# Patient Record
Sex: Female | Born: 1935
Health system: Southern US, Community
[De-identification: ages and names within clinical notes are randomized; demographics above are authoritative.]

## PROBLEM LIST (undated history)

## (undated) DIAGNOSIS — K759 Inflammatory liver disease, unspecified: Secondary | ICD-10-CM

## (undated) DIAGNOSIS — I4891 Unspecified atrial fibrillation: Secondary | ICD-10-CM

## (undated) DIAGNOSIS — I6529 Occlusion and stenosis of unspecified carotid artery: Secondary | ICD-10-CM

## (undated) DIAGNOSIS — I519 Heart disease, unspecified: Secondary | ICD-10-CM

## (undated) DIAGNOSIS — E119 Type 2 diabetes mellitus without complications: Secondary | ICD-10-CM

## (undated) DIAGNOSIS — I00 Rheumatic fever without heart involvement: Secondary | ICD-10-CM

## (undated) DIAGNOSIS — E039 Hypothyroidism, unspecified: Secondary | ICD-10-CM

## (undated) DIAGNOSIS — E785 Hyperlipidemia, unspecified: Secondary | ICD-10-CM

## (undated) DIAGNOSIS — T7840XA Allergy, unspecified, initial encounter: Secondary | ICD-10-CM

## (undated) DIAGNOSIS — I639 Cerebral infarction, unspecified: Secondary | ICD-10-CM

## (undated) DIAGNOSIS — I10 Essential (primary) hypertension: Secondary | ICD-10-CM

## (undated) DIAGNOSIS — Z972 Presence of dental prosthetic device (complete) (partial): Secondary | ICD-10-CM

## (undated) DIAGNOSIS — M170 Bilateral primary osteoarthritis of knee: Secondary | ICD-10-CM

## (undated) HISTORY — DX: Allergy, unspecified, initial encounter: T78.40XA

## (undated) HISTORY — DX: Hypothyroidism, unspecified: E03.9

## (undated) HISTORY — DX: Occlusion and stenosis of unspecified carotid artery: I65.29

## (undated) HISTORY — DX: Unspecified atrial fibrillation: I48.91

## (undated) HISTORY — DX: Type 2 diabetes mellitus without complications: E11.9

## (undated) HISTORY — DX: Heart disease, unspecified: I51.9

## (undated) HISTORY — DX: Hyperlipidemia, unspecified: E78.5

## (undated) HISTORY — DX: Bilateral primary osteoarthritis of knee: M17.0

## (undated) HISTORY — DX: Rheumatic fever without heart involvement: I00

## (undated) HISTORY — DX: Cerebral infarction, unspecified: I63.9

---

## 1970-03-03 HISTORY — PX: TOTAL ABDOMINAL HYSTERECTOMY W/ BILATERAL SALPINGOOPHORECTOMY: SHX83

## 1988-03-03 DIAGNOSIS — E119 Type 2 diabetes mellitus without complications: Secondary | ICD-10-CM

## 1988-03-03 HISTORY — DX: Type 2 diabetes mellitus without complications: E11.9

## 2011-03-04 HISTORY — PX: COLONOSCOPY: SHX174

## 2012-03-03 DIAGNOSIS — I639 Cerebral infarction, unspecified: Secondary | ICD-10-CM

## 2012-03-03 DIAGNOSIS — Z8673 Personal history of transient ischemic attack (TIA), and cerebral infarction without residual deficits: Secondary | ICD-10-CM | POA: Insufficient documentation

## 2012-03-03 DIAGNOSIS — I4891 Unspecified atrial fibrillation: Secondary | ICD-10-CM

## 2012-03-03 HISTORY — DX: Unspecified atrial fibrillation: I48.91

## 2012-03-03 HISTORY — DX: Cerebral infarction, unspecified: I63.9

## 2013-08-18 LAB — HEMOGLOBIN A1C: A1C: 10.3

## 2013-08-18 LAB — LIPID PANEL
CHOLESTEROL: 160
HDL: 35 mg/dL (ref 35–70)
LDL (calc): 116
TRIGLYCERIDES: 118

## 2013-11-04 LAB — HEMOGLOBIN A1C
A1C: 7.6
A1c: 7.6

## 2014-02-13 LAB — COMPLETE METABOLIC PANEL WITH GFR
ALT: 10
AST: 16 U/L
Alkaline Phosphatase: 64 U/L
BILIRUBIN, TOTAL: 0.4
Creat: 0.8

## 2014-02-13 LAB — TSH: TSH: 5.95

## 2014-02-13 LAB — CBC
HEMOGLOBIN: 13.3 g/dL
WBC: 8.8
platelet count: 238

## 2015-10-24 ENCOUNTER — Encounter: Payer: Self-pay | Admitting: Family Medicine

## 2015-10-24 ENCOUNTER — Ambulatory Visit (INDEPENDENT_AMBULATORY_CARE_PROVIDER_SITE_OTHER)
Admission: RE | Admit: 2015-10-24 | Discharge: 2015-10-24 | Disposition: A | Discharge status: Home / Self Care | Payer: BLUE CROSS/BLUE SHIELD | Source: Ambulatory Visit | Attending: Family Medicine | Admitting: Family Medicine

## 2015-10-24 ENCOUNTER — Ambulatory Visit (HOSPITAL_COMMUNITY): Payer: BLUE CROSS/BLUE SHIELD

## 2015-10-24 ENCOUNTER — Ambulatory Visit
Admission: RE | Admit: 2015-10-24 | Discharge: 2015-10-24 | Disposition: A | Discharge status: Home / Self Care | Payer: BLUE CROSS/BLUE SHIELD | Source: Ambulatory Visit | Attending: Family Medicine | Admitting: Family Medicine

## 2015-10-24 ENCOUNTER — Ambulatory Visit (INDEPENDENT_AMBULATORY_CARE_PROVIDER_SITE_OTHER): Payer: BLUE CROSS/BLUE SHIELD | Admitting: Family Medicine

## 2015-10-24 ENCOUNTER — Other Ambulatory Visit: Payer: Self-pay | Admitting: Family Medicine

## 2015-10-24 VITALS — BP 144/68 | HR 64 | Temp 98.0°F | Ht 63.0 in | Wt 158.2 lb

## 2015-10-24 DIAGNOSIS — M170 Bilateral primary osteoarthritis of knee: Secondary | ICD-10-CM | POA: Diagnosis not present

## 2015-10-24 DIAGNOSIS — L84 Corns and callosities: Secondary | ICD-10-CM | POA: Insufficient documentation

## 2015-10-24 DIAGNOSIS — E785 Hyperlipidemia, unspecified: Secondary | ICD-10-CM

## 2015-10-24 DIAGNOSIS — I639 Cerebral infarction, unspecified: Secondary | ICD-10-CM | POA: Diagnosis not present

## 2015-10-24 DIAGNOSIS — E118 Type 2 diabetes mellitus with unspecified complications: Secondary | ICD-10-CM | POA: Insufficient documentation

## 2015-10-24 DIAGNOSIS — Z794 Long term (current) use of insulin: Secondary | ICD-10-CM

## 2015-10-24 DIAGNOSIS — E039 Hypothyroidism, unspecified: Secondary | ICD-10-CM | POA: Insufficient documentation

## 2015-10-24 MED ORDER — METFORMIN HCL 500 MG PO TABS: 500.0000 mg | ORAL_TABLET | Freq: Every day | ORAL | 3 refills | Status: DC

## 2015-10-24 MED ORDER — LEVOTHYROXINE SODIUM 75 MCG PO TABS: 75.0000 ug | ORAL_TABLET | Freq: Every day | ORAL | 3 refills | Status: DC

## 2015-10-24 MED ORDER — RIVAROXABAN 20 MG PO TABS: 20.0000 mg | ORAL_TABLET | Freq: Every day | ORAL | 3 refills | Status: DC

## 2015-10-24 MED ORDER — ATENOLOL 50 MG PO TABS: 50.0000 mg | ORAL_TABLET | Freq: Every day | ORAL | 3 refills | Status: DC

## 2015-10-24 NOTE — Progress Notes (Signed)
Pre visit review using our clinic review tool, if applicable. No additional management support is needed unless otherwise documented below in the visit note. 

## 2015-10-24 NOTE — Progress Notes (Signed)
BP (!) 144/68   Pulse 64   Temp 98 F (36.7 C) (Oral)   Ht 5\' 3"  (1.6 m)   Wt 158 lb 4 oz (71.8 kg)   BMI 28.03 kg/m    CC: new pt to establish care Subjective:    Patient ID: [name], female    DOB: 12/07/1934, 81 y.o.   MRN: [id number]  HPI: Deanna PulseLucila Fortin is a 81 y.o. female presenting on 10/24/2015 for Establish Care   Actually born [id number] but only birth certificate found had 1936 birth year.   Here from [place]. Widow. [facility] [place]   Longstanding R>L knee pain (1969). Endorses h/o metatarsal collapse during her last pregnancy. Evaluated for flexogenics - s/p xray, US - told severe degenerative osteoarthritis. Recommended hyaluronic acid. They wanted eval by PCP prior to undergoing treatment. Wants to avoid surgery. Has been using neoprene knee brace without significant improvement. Interested in referral to discuss options. Does not want steroid injections. She does cycle regularly on stationary bike (40min 2x/wk). Using amazon cream (sombra menthol cream).   Subacute CVA by CT 2014 with residual L hemiparesis. Unsure if regularly taking aspirin when she had stroke. On xarelto since 2013.  HTN - on atenolol 50mg  daily.   Hypothyroidism - off levothyroxine 75mcg for last 4 months.    DM - dx 20+ yrs ago. Regularly does check sugars once daily fa ting - today 88. Averaging Compliant with antihyperglycemic regimen which includes: lantus 20u.  Denies low sugars or hypoglycemic symptoms.  Denies paresthesias. Last diabetic eye exam DUE.  Pneumovax: DUE.  Prevnar: DUE. [facility]. Will bring me copy of immunization records. No results found for: HGBA1C Diabetic Foot Exam - Simple   No data filed       H/o bad reaction to flu shot.   Relevant past medical, surgical, family and social history reviewed and updated as indicated. Interim medical history since our last visit  reviewed. Allergies and medications reviewed and updated. No current outpatient prescriptions on file prior to visit.   No current facility-administered medications on file prior to visit.     Review of Systems Per HPI unless specifically indicated in ROS section     Objective:    BP (!) 144/68   Pulse 64   Temp 98 F (36.7 C) (Oral)   Ht 5\' 3"  (1.6 m)   Wt 158 lb 4 oz (71.8 kg)   BMI 28.03 kg/m   Wt Readings from Last 3 Encounters:  10/24/15 158 lb 4 oz (71.8 kg)    Physical Exam  Constitutional: She appears well-developed and well-nourished. No distress.  HENT:  Head: Normocephalic and atraumatic.  Right Ear: External ear normal.  Left Ear: External ear normal.  Nose: Nose normal.  Mouth/Throat: Oropharynx is clear and moist. No oropharyngeal exudate.  Eyes: Conjunctivae and EOM are normal. Pupils are equal, round, and reactive to light. No scleral icterus.  Neck: Normal range of motion. Neck supple.  Cardiovascular: Normal rate, normal heart sounds and intact distal pulses.  An irregular rhythm present.  No murmur heard. Pulmonary/Chest: Effort normal and breath sounds normal. No respiratory distress. She has no wheezes. She has no rales.  Musculoskeletal: She exhibits no edema.  FROM bilateral knees without significant crepitus  Lymphadenopathy:    She has no cervical adenopathy.  Skin: Skin is warm and dry. No rash noted.  R fourth distal toe with tender overgrown  corn  Psychiatric: She has a normal mood and affect.  Nursing note and vitals reviewed.  No results found for this or any previous visit.    Assessment & Plan:  Over 45 minutes were spent face-to-face with the patient during this encounter and >50% of that time was spent on counseling and coordination of care  Problem List Items Addressed This Visit    Corns and callus    Refer to podiatry for diabetic foot exam and further evaluation of tender distal toe corn       Relevant Orders   Ambulatory  referral to Podiatry   HLD (hyperlipidemia)    Not on statin. Check FLP when returns for fasting labs.      Relevant Medications   atenolol (TENORMIN) 50 MG tablet   rivaroxaban (XARELTO) 20 MG TABS tablet   Other Relevant Orders   Lipid panel   Comprehensive metabolic panel   Hypothyroidism    Unclear if patient is taking levothyroxine. Have refilled regardless. Check TSH.       Relevant Medications   atenolol (TENORMIN) 50 MG tablet   levothyroxine (SYNTHROID, LEVOTHROID) 75 MCG tablet   Other Relevant Orders   TSH   Primary osteoarthritis of both knees    Pt wants to avoid surgery, also prefers to avoid corticosteroid shots due to poor effectiveness.  Will refer to sports med to discuss possible hyaluronic acid. Baseline weight bearing knee xrays today.      Relevant Orders   AMB referral to sports medicine   Stroke [facility](HCC)    H/o this, unclear story possible atrial fibrillation related. I have requested latest cardiology records [facility]. Continue meds for now (xarelto, atenolol). H/o remote L hemiparesis but pt denies significant impairment.       Relevant Medications   atenolol (TENORMIN) 50 MG tablet   rivaroxaban (XARELTO) 20 MG TABS tablet   Other Relevant Orders   CBC with Differential/Platelet   Type 2 diabetes mellitus with complication, with long-term current use of insulin (HCC) - Primary    Chronic. Unclear control given fluctuating cbg's by recall. Continue lantus and metformin. Check A1c tomorrow when returns for fasting labs. Ophtho referral today for DM eye exam.      Relevant Medications   Insulin Glargine (LANTUS SOLOSTAR) 100 UNIT/ML Solostar Pen   metFORMIN (GLUCOPHAGE) 500 MG tablet   Other Relevant Orders   Ambulatory referral to Ophthalmology   Ambulatory referral to Podiatry   Comprehensive metabolic panel   Hemoglobin A1c   Microalbumin / creatinine urine ratio    Other Visit Diagnoses   None.      Follow up  plan: Return in about 3 months (around 01/24/2016) for annual exam, prior fasting for blood work.  Eustaquio BoydenJavier Brylyn Novakovich, MD

## 2015-10-24 NOTE — Patient Instructions (Addendum)
Traigame records de imunizaciones.  Gusto conocerla hoy.  Pediremos records de [facility] en [place]. La mandaremos a Dr [employer] doctor de medicina deportiva. rayos x hoy para rodillas. Regrese maana para laboratorios.  Le he rellenado medicinas hoy.  Regresar en 3 meses para proxima visita

## 2015-10-24 NOTE — Assessment & Plan Note (Signed)
Pt wants to avoid surgery, also prefers to avoid corticosteroid shots due to poor effectiveness.  Will refer to sports med to discuss possible hyaluronic acid. Baseline weight bearing knee xrays today.

## 2015-10-24 NOTE — Assessment & Plan Note (Addendum)
H/o this, unclear story possible atrial fibrillation related. I have requested [facility]. Continue meds for now (xarelto, atenolol). H/o remote L hemiparesis but pt denies significant impairment.

## 2015-10-24 NOTE — Assessment & Plan Note (Signed)
Refer to podiatry for diabetic foot exam and further evaluation of tender distal toe corn

## 2015-10-24 NOTE — Assessment & Plan Note (Signed)
Not on statin. Check FLP when returns for fasting labs.

## 2015-10-24 NOTE — Assessment & Plan Note (Signed)
Unclear if patient is taking levothyroxine. Have refilled regardless. Check TSH.

## 2015-10-24 NOTE — Assessment & Plan Note (Deleted)
Chronic. Unclear control given fluctuating cbg's by recall. Continue lantus and metformin. Check A1c tomorrow when returns for fasting labs. Ophtho referral today for DM eye exam. 

## 2015-10-24 NOTE — Assessment & Plan Note (Signed)
Chronic. Unclear control given fluctuating cbg's by recall. Continue lantus and metformin. Check A1c tomorrow when returns for fasting labs. Ophtho referral today for DM eye exam.

## 2015-10-25 ENCOUNTER — Encounter: Payer: Self-pay | Admitting: Family Medicine

## 2015-10-25 ENCOUNTER — Other Ambulatory Visit (INDEPENDENT_AMBULATORY_CARE_PROVIDER_SITE_OTHER): Payer: BLUE CROSS/BLUE SHIELD

## 2015-10-25 DIAGNOSIS — Z794 Long term (current) use of insulin: Secondary | ICD-10-CM

## 2015-10-25 DIAGNOSIS — I639 Cerebral infarction, unspecified: Secondary | ICD-10-CM

## 2015-10-25 DIAGNOSIS — E039 Hypothyroidism, unspecified: Secondary | ICD-10-CM | POA: Diagnosis not present

## 2015-10-25 DIAGNOSIS — E785 Hyperlipidemia, unspecified: Secondary | ICD-10-CM | POA: Diagnosis not present

## 2015-10-25 DIAGNOSIS — I6529 Occlusion and stenosis of unspecified carotid artery: Secondary | ICD-10-CM | POA: Insufficient documentation

## 2015-10-25 DIAGNOSIS — E118 Type 2 diabetes mellitus with unspecified complications: Secondary | ICD-10-CM

## 2015-10-25 DIAGNOSIS — M8580 Other specified disorders of bone density and structure, unspecified site: Secondary | ICD-10-CM | POA: Insufficient documentation

## 2015-10-25 HISTORY — DX: Occlusion and stenosis of unspecified carotid artery: I65.29

## 2015-10-25 LAB — CBC WITH DIFFERENTIAL/PLATELET
BASOS PCT: 0.5 % (ref 0.0–3.0)
Basophils Absolute: 0 10*3/uL (ref 0.0–0.1)
EOS PCT: 3.5 % (ref 0.0–5.0)
Eosinophils Absolute: 0.3 10*3/uL (ref 0.0–0.7)
HCT: 34.6 % — ABNORMAL LOW (ref 36.0–46.0)
Hemoglobin: 11.3 g/dL — ABNORMAL LOW (ref 12.0–15.0)
LYMPHS ABS: 1.9 10*3/uL (ref 0.7–4.0)
Lymphocytes Relative: 24.8 % (ref 12.0–46.0)
MCHC: 32.7 g/dL (ref 30.0–36.0)
MCV: 81.3 fl (ref 78.0–100.0)
MONO ABS: 0.5 10*3/uL (ref 0.1–1.0)
MONOS PCT: 6.3 % (ref 3.0–12.0)
NEUTROS PCT: 64.9 % (ref 43.0–77.0)
Neutro Abs: 4.9 10*3/uL (ref 1.4–7.7)
Platelets: 217 10*3/uL (ref 150.0–400.0)
RBC: 4.25 Mil/uL (ref 3.87–5.11)
RDW: 15.8 % — AB (ref 11.5–15.5)
WBC: 7.6 10*3/uL (ref 4.0–10.5)

## 2015-10-25 LAB — COMPREHENSIVE METABOLIC PANEL
ALT: 9 U/L (ref 0–35)
AST: 15 U/L (ref 0–37)
Albumin: 3.8 g/dL (ref 3.5–5.2)
Alkaline Phosphatase: 47 U/L (ref 39–117)
BILIRUBIN TOTAL: 0.4 mg/dL (ref 0.2–1.2)
BUN: 17 mg/dL (ref 6–23)
CHLORIDE: 105 meq/L (ref 96–112)
CO2: 28 meq/L (ref 19–32)
CREATININE: 0.81 mg/dL (ref 0.40–1.20)
Calcium: 8.7 mg/dL (ref 8.4–10.5)
GFR: 72.5 mL/min (ref >60.00)
GLUCOSE: 80 mg/dL (ref 70–99)
Potassium: 4.1 mEq/L (ref 3.5–5.1)
SODIUM: 139 meq/L (ref 135–145)
Total Protein: 7.1 g/dL (ref 6.0–8.3)

## 2015-10-25 LAB — LIPID PANEL
CHOL/HDL RATIO: 4
Cholesterol: 122 mg/dL (ref 0–200)
HDL: 33.4 mg/dL — ABNORMAL LOW (ref >39.00)
LDL CALC: 69 mg/dL (ref 0–99)
NONHDL: 88.65
TRIGLYCERIDES: 98 mg/dL (ref 0.0–149.0)
VLDL: 19.6 mg/dL (ref 0.0–40.0)

## 2015-10-25 LAB — MICROALBUMIN / CREATININE URINE RATIO
CREATININE, U: 59.9 mg/dL
MICROALB/CREAT RATIO: 1.2 mg/g (ref 0.0–30.0)

## 2015-10-25 LAB — TSH: TSH: 3.89 u[IU]/mL (ref 0.35–4.50)

## 2015-10-25 LAB — HEMOGLOBIN A1C: HEMOGLOBIN A1C: 7.3 % — AB (ref 4.6–6.5)

## 2015-10-27 ENCOUNTER — Encounter: Payer: Self-pay | Admitting: Family Medicine

## 2015-10-29 ENCOUNTER — Encounter: Payer: Self-pay | Admitting: *Deleted

## 2015-11-01 ENCOUNTER — Ambulatory Visit (INDEPENDENT_AMBULATORY_CARE_PROVIDER_SITE_OTHER): Payer: BLUE CROSS/BLUE SHIELD | Admitting: Family Medicine

## 2015-11-01 ENCOUNTER — Encounter: Payer: Self-pay | Admitting: Family Medicine

## 2015-11-01 VITALS — BP 105/59 | HR 85 | Temp 97.9°F | Ht 63.0 in | Wt 159.5 lb

## 2015-11-01 DIAGNOSIS — M170 Bilateral primary osteoarthritis of knee: Secondary | ICD-10-CM | POA: Diagnosis not present

## 2015-11-01 MED ORDER — HYLAN G-F 20 48 MG/6ML IX SOSY
48.0000 [IU] | PREFILLED_SYRINGE | Freq: Once | INTRA_ARTICULAR | Status: AC
  Administered 2015-11-01: 48 [IU] via INTRA_ARTICULAR

## 2015-11-01 NOTE — Progress Notes (Signed)
Dr. Karleen HampshireSpencer T. Malarie Tappen, MD, CAQ Sports Medicine Primary Care and Sports Medicine [address] [place], [address] Phone: [phone] Fax: [phone]  11/01/2015  Patient: [name], MRN: [id number], DOB: 04/09/1934, 81 y.o.  Primary Physician:  Eustaquio BoydenJavier Gutierrez, MD   Chief Complaint  Patient presents with  . Knee Pain    Bilateral   Subjective:   [name] is a 81 y.o. very pleasant female patient who presents with the following:  B knee pain: Seen at the request of Dr. Reece AgarG.  Primary language interpretation is done by the patient's daughter.  The patient reports that she has been having problems with both of her knees for 50 years.  She does have notable genu varus.  She is unclear about any kind specific trauma or injury.  She is seen multiple doctors in [place] regarding her knees.  It seems as if at some 0.20 years ago she was told that she did not have arthritis, and she has reluctant to accept that she does have arthritis.  I pulled up her radiographs and reviewed him face-to-face with both the patient and her daughter.  She clearly has end-stage degenerative joint disease of both of her knees.  The actually recently went to a [facility] clinic for suggestions.  They had recommended a all fluid her brace by [place], as well as hyaluronic acid injections.  Unfortunately, in this clinic, this was going to be a cash pay service.  Past Medical History, Surgical History, Social History, Family History, Problem List, Medications, and Allergies have been reviewed and updated if relevant.  Patient Active Problem List   Diagnosis Date Noted  . Carotid stenosis 10/25/2015  . Osteopenia 10/25/2015  . Primary osteoarthritis of both knees 10/24/2015  . Type 2 diabetes mellitus with complication, with long-term current use of insulin (HCC) 10/24/2015  . Corns and callus 10/24/2015  . HLD (hyperlipidemia)   . Hypothyroidism   . Ischemic stroke (HCC) 03/03/2012  . Atrial  fibrillation (HCC) 03/03/2012    Past Medical History:  Diagnosis Date  . Allergy   . Atrial fibrillation (HCC) 2014   presumed as on xarelto after CVA 2014  . Bilateral primary osteoarthritis of knee    severe by xrays  . Carotid stenosis 10/25/2015   Mild by US 2014  . Diabetes mellitus without complication (HCC) 1994  . Heart disease   . HLD (hyperlipidemia)   . Hypothyroidism   . Ischemic stroke (HCC) 2014   R basal ganglion infarct with L hemiparesis 2014, now largely resolved, uses cane  . Rheumatic fever     Past Surgical History:  Procedure Laterality Date  . TOTAL ABDOMINAL HYSTERECTOMY W/ BILATERAL SALPINGOOPHORECTOMY  1972    Social History   Social History  . Marital status: Widowed    Spouse name: N/A  . Number of children: N/A  . Years of education: N/A   Occupational History  . Not on file.   Social History Main Topics  . Smoking status: Never Smoker  . Smokeless tobacco: Never Used  . Alcohol use No  . Drug use: No  . Sexual activity: Not on file   Other Topics Concern  . Not on file   Social History Narrative   Lives with daughter   Len BlalockWidow    Originally from [place], moved 2013   G5P5    No family history on file.  Allergies  Allergen Reactions  . Chloramphenicols Swelling    Moderate facial swelling  Medication list reviewed and updated in full in [facility].  GEN: No fevers, chills. Nontoxic. Primarily MSK c/o today. MSK: Detailed in the HPI GI: tolerating PO intake without difficulty Neuro: No numbness, parasthesias, or tingling associated. Otherwise the pertinent positives of the ROS are noted above.   Objective:   BP (!) 105/59   [name] 85   Temp 97.9 F (36.6 C) (Oral)   Ht 5\' 3"  (1.6 m)   Wt 159 lb 8 oz (72.3 kg)   BMI 28.25 kg/m    GEN: WDWN, NAD, Non-toxic, Alert & Oriented x 3 HEENT: Atraumatic, Normocephalic.  Ears and Nose: No external deformity. EXTR: No clubbing/cyanosis/edema NEURO:  Normal gait.  PSYCH: Normally interactive. Conversant. Not depressed or anxious appearing.  Calm demeanor.   Knee:  B Gait: Normal heel toe pattern, antalgic ROM: 0-100 Effusion: mild Echymosis or edema: none Patellar tendon NT Painful PLICA: neg Patellar grind: negative Medial and lateral patellar facet loading: mild TTP medial and lateral joint lines: medial tenderness Mcmurray's neg Flexion-pinch pos Varus and valgus stress: stable Lachman: neg Ant and Post drawer: neg Hip abduction, IR, ER: WNL Hip flexion str: 5/5 Hip abd: 5/5 Quad: 5/5 VMO atrophy: mild - mod Hamstring concentric and eccentric: 5/5   Radiology: Dg Knee Ap/lat W/sunrise Left  Result Date: 10/24/2015 CLINICAL DATA:  Knee pain, initial encounter EXAM: LEFT KNEE 3 VIEWS COMPARISON:  None. FINDINGS: Severe degenerative changes are noted most marked in the medial joint space. A small joint effusion is seen. No acute fracture or dislocation is noted. IMPRESSION: Degenerative changes with small joint effusion. Electronically Signed   By: Alcide Clever M.D.   On: 10/24/2015 13:24   Dg Knee Ap/lat W/sunrise Right  Result Date: 10/24/2015 CLINICAL DATA:  Right knee pain, initial encounter EXAM: RIGHT KNEE 3 VIEWS COMPARISON:  None. FINDINGS: Severe degenerative changes are noted most marked in the medial joint space. No joint effusion is seen. Vascular calcifications are noted. IMPRESSION: Degenerative changes most marked in the medial joint space. Electronically Signed   By: Alcide Clever M.D.   On: 10/24/2015 13:23    Assessment and Plan:   Primary osteoarthritis of both knees - Plan: Hylan SOSY 48 Units, Hylan SOSY 48 Units  >25 minutes spent in face to face time with patient, >50% spent in counselling or coordination of care: additional time needed given language barriers and interpretation.  There is also some difference and I think basic health belief systems, and the patient doesn't seem to fully understand  that she does have advanced osteoarthritis of both of her knees.  I did my best to explain the state of her knees and why she is having some knee pain.  Radiographically, doubtful anything short of a total knee arthroplasty would be a definitive relief.  She had hyaluronic acid injections for 5 years ago, and these helped quite a bit.  I think that this is very reasonable to try again.  My suggestion was to do one now and then repeat the other knee in a couple of weeks.  At this point the daughter would prefer to do both of them now.  She is willing even if one of her injections is not covered by medical insurance to pay for these on an out-of-pocket standpoint.  I also gave her a off loader brace, left-sided.  We'll see how she does with this.  If he finds it of significant benefit, then it would be very easy to give her right  one also.  Knee Injection: Synvisc-One, RIGHT Patient verbally consented to procedure. Risks (including infection), benefits, and alternatives explained. Sterilely prepped with Chloraprep. Ethyl cholride used for anesthesia, then 7 cc of Lidocaine 1% used for anesthesia in the anterolateral position. Reprepped with Chloraprep.  Anterolateral approach used to inject joint without difficulty, injected with Synvisc-One, 6 mL. No complications with procedure and tolerated well.   Knee Injection: Synvisc-One, RIGHT Patient verbally consented to procedure. Risks (including infection), benefits, and alternatives explained. Sterilely prepped with Chloraprep. Ethyl cholride used for anesthesia, then 7 cc of Lidocaine 1% used for anesthesia in the anterolateral position. Reprepped with Chloraprep.  Anterolateral approach used to inject joint without difficulty, injected with Synvisc-One, 6 mL. No complications with procedure and tolerated well.   Follow-up: when back with her daughter  Clovia Cuff. Tkai Large, MD   Patient's Medications  New Prescriptions   No medications on  file  Previous Medications   ATENOLOL (TENORMIN) 50 MG TABLET    Take 1 tablet (50 mg total) by mouth daily.   BIOFLAVONOID PRODUCTS (BIOFLEX) TABS    Take 1 tablet by mouth daily.   INSULIN GLARGINE (LANTUS SOLOSTAR) 100 UNIT/ML SOLOSTAR PEN    Inject 20 Units into the skin daily.    LEVOTHYROXINE (SYNTHROID, LEVOTHROID) 75 MCG TABLET    Take 1 tablet (75 mcg total) by mouth daily before breakfast.   METFORMIN (GLUCOPHAGE) 500 MG TABLET    Take 1 tablet (500 mg total) by mouth daily with breakfast.   RIVAROXABAN (XARELTO) 20 MG TABS TABLET    Take 1 tablet (20 mg total) by mouth daily with supper.  Modified Medications   No medications on file  Discontinued Medications   No medications on file

## 2015-11-01 NOTE — Progress Notes (Signed)
Pre visit review using our clinic review tool, if applicable. No additional management support is needed unless otherwise documented below in the visit note. 

## 2015-11-07 ENCOUNTER — Telehealth: Payer: Self-pay

## 2015-11-07 NOTE — Telephone Encounter (Signed)
Pt left v/m; pt seen 11/01/15 and got knee brace; when pt walks feels like brace opens up. Pt request different size knee brace. Pt request cb.

## 2015-11-08 NOTE — Telephone Encounter (Signed)
Can you find out more information? She has had the brace for 8 days - we cannot take it back. Can you call Alycia RossettiRyan or the other gentleman and ask and see if they can trade it out.   I believe she had a size small. If there are specific sizing concerns, she may have to go with a custom made brace.

## 2015-11-08 NOTE — Telephone Encounter (Signed)
Left message for [name] to return my call.

## 2015-11-09 NOTE — Telephone Encounter (Signed)
Spoke with daughter.  She thinks her mom needs a size medium in the knee brace.  They would also like to get a second one for the other knee.  She is willing to pay for it if needed.  She thinks the brace is helping.  She [facility] and [facility] 2 medium [employer] Knee Brace.

## 2015-11-09 NOTE — Telephone Encounter (Signed)
Again, we cannot accept this product back that we do not own. I will [facility] to [facility] to [facility].

## 2015-11-20 LAB — HM DIABETES EYE EXAM

## 2015-11-24 ENCOUNTER — Encounter: Payer: Self-pay | Admitting: Family Medicine

## 2015-11-27 ENCOUNTER — Encounter: Payer: Self-pay | Admitting: *Deleted

## 2015-12-18 ENCOUNTER — Telehealth: Payer: Self-pay

## 2015-12-18 ENCOUNTER — Encounter: Payer: Self-pay | Admitting: *Deleted

## 2015-12-18 NOTE — Telephone Encounter (Signed)
Message left advising patient's daughter.  

## 2015-12-18 NOTE — Telephone Encounter (Signed)
Yes - she was irregular in office. Irregular heart beat is due to atrial fibrillation and that is why she is on xarelto - to help prevent strokes.

## 2015-12-18 NOTE — Discharge Instructions (Signed)

## 2015-12-18 NOTE — Telephone Encounter (Signed)
Vernona RiegerLaura pts daughter (DPR signed) left v/m; pt is supposed to have cataract surgery 12/25/15; pt went to preop appt and pt had irregular heart beat. Vernona RiegerLaura wants to know if Dr Reece AgarG will ck [place] records and pts physical 10/24/2015 with Dr Reece AgarG to see if pt had irregular heart beat. Vernona RiegerLaura request cb.

## 2015-12-25 ENCOUNTER — Ambulatory Visit: Payer: BLUE CROSS/BLUE SHIELD | Admitting: Anesthesiology

## 2015-12-25 ENCOUNTER — Encounter
Admission: RE | Disposition: A | Discharge status: Home / Self Care | Payer: Self-pay | Source: Ambulatory Visit | Attending: Ophthalmology

## 2015-12-25 ENCOUNTER — Ambulatory Visit
Admission: RE | Admit: 2015-12-25 | Discharge: 2015-12-25 | Disposition: A | Discharge status: Home / Self Care | Payer: BLUE CROSS/BLUE SHIELD | Source: Ambulatory Visit | Attending: Ophthalmology | Admitting: Ophthalmology

## 2015-12-25 DIAGNOSIS — Z8673 Personal history of transient ischemic attack (TIA), and cerebral infarction without residual deficits: Secondary | ICD-10-CM | POA: Diagnosis not present

## 2015-12-25 DIAGNOSIS — I499 Cardiac arrhythmia, unspecified: Secondary | ICD-10-CM | POA: Diagnosis not present

## 2015-12-25 DIAGNOSIS — M1990 Unspecified osteoarthritis, unspecified site: Secondary | ICD-10-CM | POA: Insufficient documentation

## 2015-12-25 DIAGNOSIS — I10 Essential (primary) hypertension: Secondary | ICD-10-CM | POA: Insufficient documentation

## 2015-12-25 DIAGNOSIS — H2511 Age-related nuclear cataract, right eye: Secondary | ICD-10-CM | POA: Diagnosis present

## 2015-12-25 DIAGNOSIS — E079 Disorder of thyroid, unspecified: Secondary | ICD-10-CM | POA: Insufficient documentation

## 2015-12-25 DIAGNOSIS — Z888 Allergy status to other drugs, medicaments and biological substances status: Secondary | ICD-10-CM | POA: Diagnosis not present

## 2015-12-25 DIAGNOSIS — Z90710 Acquired absence of both cervix and uterus: Secondary | ICD-10-CM | POA: Diagnosis not present

## 2015-12-25 DIAGNOSIS — E1136 Type 2 diabetes mellitus with diabetic cataract: Secondary | ICD-10-CM | POA: Insufficient documentation

## 2015-12-25 HISTORY — DX: Presence of dental prosthetic device (complete) (partial): Z97.2

## 2015-12-25 HISTORY — DX: Essential (primary) hypertension: I10

## 2015-12-25 HISTORY — PX: CATARACT EXTRACTION W/PHACO: SHX586

## 2015-12-25 HISTORY — DX: Inflammatory liver disease, unspecified: K75.9

## 2015-12-25 LAB — GLUCOSE, CAPILLARY
GLUCOSE-CAPILLARY: 56 mg/dL — AB (ref 65–99)
Glucose-Capillary: 72 mg/dL (ref 65–99)
Glucose-Capillary: 76 mg/dL (ref 65–99)

## 2015-12-25 SURGERY — PHACOEMULSIFICATION, CATARACT, WITH IOL INSERTION
Anesthesia: Monitor Anesthesia Care | Laterality: Right | Wound class: Clean

## 2015-12-25 MED ORDER — MIDAZOLAM HCL 2 MG/2ML IJ SOLN
INTRAMUSCULAR | Status: DC | PRN
  Administered 2015-12-25: 2 mg via INTRAVENOUS

## 2015-12-25 MED ORDER — MOXIFLOXACIN HCL 0.5 % OP SOLN
OPHTHALMIC | Status: DC | PRN
  Administered 2015-12-25: 15 [drp] via OPHTHALMIC

## 2015-12-25 MED ORDER — EPINEPHRINE PF 1 MG/ML IJ SOLN
INTRAOCULAR | Status: DC | PRN
  Administered 2015-12-25: 65 mL via OPHTHALMIC

## 2015-12-25 MED ORDER — SODIUM HYALURONATE 23 MG/ML IO SOLN
INTRAOCULAR | Status: DC | PRN
Start: 2015-12-25 — End: 2015-12-25
  Administered 2015-12-25: 0.6 mL via INTRAOCULAR

## 2015-12-25 MED ORDER — LIDOCAINE HCL (PF) 4 % IJ SOLN
INTRAOCULAR | Status: DC | PRN
  Administered 2015-12-25: .5 mL via OPHTHALMIC

## 2015-12-25 MED ORDER — FENTANYL CITRATE (PF) 100 MCG/2ML IJ SOLN
INTRAMUSCULAR | Status: DC | PRN
  Administered 2015-12-25: 50 ug via INTRAVENOUS

## 2015-12-25 MED ORDER — ARMC OPHTHALMIC DILATING DROPS
1.0000 | OPHTHALMIC | Status: DC | PRN
Start: 2015-12-25 — End: 2015-12-25
  Administered 2015-12-25 (×3): 1 via OPHTHALMIC

## 2015-12-25 MED ORDER — SODIUM HYALURONATE 10 MG/ML IO SOLN
INTRAOCULAR | Status: DC | PRN
  Administered 2015-12-25: .5 mL via INTRAOCULAR

## 2015-12-25 SURGICAL SUPPLY — 19 items
CANNULA ANT/CHMB 27GA (MISCELLANEOUS) ×2 IMPLANT
CUP MEDICINE 2OZ PLAST GRAD ST (MISCELLANEOUS) ×2 IMPLANT
DISSECTOR HYDRO NUCLEUS 50X22 (MISCELLANEOUS) ×2 IMPLANT
GLOVE BIO SURGEON STRL SZ8 (GLOVE) ×2 IMPLANT
GLOVE SURG LX 7.5 STRW (GLOVE) ×1
GLOVE SURG LX STRL 7.5 STRW (GLOVE) ×1 IMPLANT
GOWN STRL REUS W/ TWL LRG LVL3 (GOWN DISPOSABLE) ×2 IMPLANT
GOWN STRL REUS W/TWL LRG LVL3 (GOWN DISPOSABLE) ×2
LENS IOL TECNIS ITEC 20.5 (Intraocular Lens) ×2 IMPLANT
MARKER SKIN DUAL TIP RULER LAB (MISCELLANEOUS) ×2 IMPLANT
PACK CATARACT (MISCELLANEOUS) ×2 IMPLANT
PACK CATARACT BRASINGTON (MISCELLANEOUS) ×2 IMPLANT
PACK EYE AFTER SURG (MISCELLANEOUS) ×2 IMPLANT
SOL PREP PVP 2OZ (MISCELLANEOUS) ×2
SOLUTION PREP PVP 2OZ (MISCELLANEOUS) ×1 IMPLANT
SYR 3ML LL SCALE MARK (SYRINGE) ×2 IMPLANT
SYR TB 1ML LUER SLIP (SYRINGE) ×2 IMPLANT
WATER STERILE IRR 250ML POUR (IV SOLUTION) ×2 IMPLANT
WIPE NON LINTING 3.25X3.25 (MISCELLANEOUS) ×2 IMPLANT

## 2015-12-25 NOTE — H&P (Signed)
The History and Physical notes are on paper, have been signed, and are to be scanned. The patient remains stable and unchanged from the H&P.   Previous H&P reviewed, patient examined, and there are no changes.  Willey BladeBradley Shiah Berhow 12/25/2015 8:08 AM

## 2015-12-25 NOTE — Transfer of Care (Signed)
Immediate Anesthesia Transfer of Care Note  Patient: [name]  Procedure(s) Performed: Procedure(s) with comments: CATARACT EXTRACTION PHACO AND INTRAOCULAR LENS PLACEMENT (IOC) (Right) - DIABETIC NEEDS INTERPRETER RIGHT  Patient Location: PACU  Anesthesia Type: MAC  Level of Consciousness: awake, alert  and patient cooperative  Airway and Oxygen Therapy: Patient Spontanous Breathing and Patient connected to supplemental oxygen  Post-op Assessment: Post-op Vital signs reviewed, Patient's Cardiovascular Status Stable, Respiratory Function Stable, Patent Airway and No signs of Nausea or vomiting  Post-op Vital Signs: Reviewed and stable  Complications: No apparent anesthesia complications

## 2015-12-25 NOTE — Op Note (Signed)
OPERATIVE NOTE  Deanna PulseLucila [name] [id number] 12/25/2015   PREOPERATIVE DIAGNOSIS:  Nuclear sclerotic cataract right eye.  H25.11   POSTOPERATIVE DIAGNOSIS:    Nuclear sclerotic cataract right eye.     PROCEDURE:  Phacoemusification with posterior chamber intraocular lens placement of the right eye   LENS:   Implant Name Type Inv. Item Serial No. Manufacturer Lot No. LRB No. Used  LENS IOL DIOP 20.5 - [id number] Intraocular Lens LENS IOL DIOP 20.5 [id number] AMO   Right 1       PCB00 +20.5   ULTRASOUND TIME: 1 minutes 49 seconds.  CDE 15.30   SURGEON:  Willey BladeBradley Pavlos Yon, MD, MPH  ANESTHESIOLOGIST: Anesthesiologist: Durene FruitsNeel G Thomas, MD CRNA: Andee PolesWendy Bush, CRNA   ANESTHESIA:  Topical with tetracaine drops and 2% Xylocaine jelly, augmented with 1% preservative-free intracameral lidocaine.  ESTIMATED BLOOD LOSS: less than 1 mL.   COMPLICATIONS:  None.   DESCRIPTION OF PROCEDURE:  The patient was identified in the holding room and transported to the operating room and placed in the supine position under the operating microscope.  The right eye was identified as the operative eye and it was prepped and draped in the usual sterile ophthalmic fashion.   A 1.0 millimeter clear-corneal paracentesis was made at the 10:30 position. 0.5 ml of preservative-free 1% lidocaine with epinephrine was injected into the anterior chamber.  The anterior chamber was filled with Healon 5 viscoelastic.  A 2.4 millimeter keratome was used to make a near-clear corneal incision at the 8:00 position.  A curvilinear capsulorrhexis was made with a cystotome and capsulorrhexis forceps.  Balanced salt solution was used to hydrodissect and hydrodelineate the nucleus.   Phacoemulsification was then used in stop and chop fashion to remove the lens nucleus and epinucleus.  The remaining cortex was then removed using the irrigation and aspiration handpiece. Healon was then placed into the capsular bag to distend it for lens  placement.  A lens was then injected into the capsular bag.  The remaining viscoelastic was aspirated.   Wounds were hydrated with balanced salt solution.  The anterior chamber was inflated to a physiologic pressure with balanced salt solution.    Intracameral vigamox 0.1 mL undiluted was injected into the eye.  The rhexis was mildly irregular but continuous in this routine case.  No wound leaks were noted.  Topical Vigamox drops were applied to the eye.  The patient was taken to the recovery room in stable condition without complications of anesthesia or surgery  Willey BladeBradley Michelle Vanhise 12/25/2015, 8:45 AM

## 2015-12-25 NOTE — Anesthesia Preprocedure Evaluation (Addendum)
Anesthesia Evaluation  Patient identified by MRN, date of birth, ID band Patient awake    Reviewed: Allergy & Precautions, H&P , NPO status   Airway Mallampati: II  TM Distance: >3 FB Neck ROM: full    Dental   Pulmonary    breath sounds clear to auscultation       Cardiovascular hypertension,  Rhythm:Irregular Rate:Abnormal     Neuro/Psych CVA    GI/Hepatic (+) Hepatitis -  Endo/Other  diabetesHypothyroidism   Renal/GU      Musculoskeletal   Abdominal   Peds  Hematology   Anesthesia Other Findings   Reproductive/Obstetrics                            Anesthesia Physical Anesthesia Plan  ASA: III  Anesthesia Plan: MAC   Post-op Pain Management:    Induction:   Airway Management Planned:   Additional Equipment:   Intra-op Plan:   Post-operative Plan:   Informed Consent: I have reviewed the patients History and Physical, chart, labs and discussed the procedure including the risks, benefits and alternatives for the proposed anesthesia with the patient or authorized representative who has indicated his/her understanding and acceptance.     Plan Discussed with: CRNA  Anesthesia Plan Comments:         Anesthesia Quick Evaluation

## 2015-12-25 NOTE — Anesthesia Postprocedure Evaluation (Signed)
Anesthesia Post Note  Patient: Deanna PulseLucila [name]  Procedure(s) Performed: Procedure(s) (LRB): CATARACT EXTRACTION PHACO AND INTRAOCULAR LENS PLACEMENT (IOC) (Right)  Patient location during evaluation: PACU Anesthesia Type: MAC Level of consciousness: awake and alert Pain management: pain level controlled Vital Signs Assessment: post-procedure vital signs reviewed and stable Respiratory status: spontaneous breathing, nonlabored ventilation, respiratory function stable and patient connected to nasal cannula oxygen Cardiovascular status: stable and blood pressure returned to baseline Anesthetic complications: no    Durene Fruitshomas,  Ermina Oberman G

## 2015-12-25 NOTE — Anesthesia Procedure Notes (Signed)
Procedure Name: MAC Performed by: Rosmery Duggin Pre-anesthesia Checklist: Patient identified, Emergency Drugs available, Suction available, Timeout performed and Patient being monitored Patient Re-evaluated:Patient Re-evaluated prior to inductionOxygen Delivery Method: Nasal cannula Placement Confirmation: positive ETCO2     

## 2015-12-26 ENCOUNTER — Encounter: Payer: Self-pay | Admitting: Ophthalmology

## 2016-01-17 ENCOUNTER — Other Ambulatory Visit: Payer: Self-pay | Admitting: Family Medicine

## 2016-01-17 ENCOUNTER — Other Ambulatory Visit (INDEPENDENT_AMBULATORY_CARE_PROVIDER_SITE_OTHER): Payer: BLUE CROSS/BLUE SHIELD

## 2016-01-17 DIAGNOSIS — D649 Anemia, unspecified: Secondary | ICD-10-CM | POA: Diagnosis not present

## 2016-01-17 DIAGNOSIS — E118 Type 2 diabetes mellitus with unspecified complications: Secondary | ICD-10-CM

## 2016-01-17 DIAGNOSIS — Z794 Long term (current) use of insulin: Principal | ICD-10-CM

## 2016-01-17 LAB — CBC WITH DIFFERENTIAL/PLATELET
BASOS ABS: 0 10*3/uL (ref 0.0–0.1)
Basophils Relative: 0.5 % (ref 0.0–3.0)
EOS ABS: 0.1 10*3/uL (ref 0.0–0.7)
EOS PCT: 1.7 % (ref 0.0–5.0)
HCT: 37.6 % (ref 36.0–46.0)
HEMOGLOBIN: 12.2 g/dL (ref 12.0–15.0)
LYMPHS ABS: 1.9 10*3/uL (ref 0.7–4.0)
Lymphocytes Relative: 23.5 % (ref 12.0–46.0)
MCHC: 32.5 g/dL (ref 30.0–36.0)
MCV: 80.9 fl (ref 78.0–100.0)
MONO ABS: 0.5 10*3/uL (ref 0.1–1.0)
Monocytes Relative: 6.4 % (ref 3.0–12.0)
NEUTROS PCT: 67.9 % (ref 43.0–77.0)
Neutro Abs: 5.4 10*3/uL (ref 1.4–7.7)
Platelets: 254 10*3/uL (ref 150.0–400.0)
RBC: 4.65 Mil/uL (ref 3.87–5.11)
RDW: 15.4 % (ref 11.5–15.5)
WBC: 7.9 10*3/uL (ref 4.0–10.5)

## 2016-01-17 LAB — FOLATE: Folate: 15.6 ng/mL (ref >5.9)

## 2016-01-17 LAB — HEMOGLOBIN A1C: HEMOGLOBIN A1C: 7.2 % — AB (ref 4.6–6.5)

## 2016-01-17 LAB — VITAMIN B12: Vitamin B-12: 477 pg/mL (ref 211–911)

## 2016-01-17 LAB — IBC PANEL
IRON: 77 ug/dL (ref 42–145)
SATURATION RATIOS: 19.2 % — AB (ref 20.0–50.0)
TRANSFERRIN: 286 mg/dL (ref 212.0–360.0)

## 2016-01-17 LAB — FERRITIN: FERRITIN: 7.7 ng/mL — AB (ref 10.0–291.0)

## 2016-01-23 ENCOUNTER — Ambulatory Visit (INDEPENDENT_AMBULATORY_CARE_PROVIDER_SITE_OTHER): Payer: BLUE CROSS/BLUE SHIELD | Admitting: Family Medicine

## 2016-01-23 ENCOUNTER — Encounter: Payer: Self-pay | Admitting: Family Medicine

## 2016-01-23 VITALS — BP 122/84 | HR 80 | Temp 97.9°F | Ht 63.0 in | Wt 158.5 lb

## 2016-01-23 DIAGNOSIS — I480 Paroxysmal atrial fibrillation: Secondary | ICD-10-CM | POA: Diagnosis not present

## 2016-01-23 DIAGNOSIS — Z794 Long term (current) use of insulin: Secondary | ICD-10-CM

## 2016-01-23 DIAGNOSIS — Z23 Encounter for immunization: Secondary | ICD-10-CM

## 2016-01-23 DIAGNOSIS — Z7189 Other specified counseling: Secondary | ICD-10-CM

## 2016-01-23 DIAGNOSIS — Z0000 Encounter for general adult medical examination without abnormal findings: Secondary | ICD-10-CM

## 2016-01-23 DIAGNOSIS — M8580 Other specified disorders of bone density and structure, unspecified site: Secondary | ICD-10-CM

## 2016-01-23 DIAGNOSIS — F5101 Primary insomnia: Secondary | ICD-10-CM

## 2016-01-23 DIAGNOSIS — M170 Bilateral primary osteoarthritis of knee: Secondary | ICD-10-CM

## 2016-01-23 DIAGNOSIS — E039 Hypothyroidism, unspecified: Secondary | ICD-10-CM

## 2016-01-23 DIAGNOSIS — E785 Hyperlipidemia, unspecified: Secondary | ICD-10-CM

## 2016-01-23 DIAGNOSIS — E118 Type 2 diabetes mellitus with unspecified complications: Secondary | ICD-10-CM

## 2016-01-23 DIAGNOSIS — E611 Iron deficiency: Secondary | ICD-10-CM

## 2016-01-23 NOTE — Progress Notes (Signed)
Pre visit review using our clinic review tool, if applicable. No additional management support is needed unless otherwise documented below in the visit note. 

## 2016-01-23 NOTE — Patient Instructions (Addendum)
prevnar today Revisen sobre vacunas en [place].  Suba nivel de hierro en la dieta. si no ayuda, podemos empezar pepa de hierro diario.  Advanced directive packet provided today.  regresar en 4-6 meses para proxima visita.   [name] (Health Maintenance, Female) Un estilo de vida saludable y los cuidados preventivos pueden favorecer considerablemente a la salud y Musician. Pregunte a su mdico cul es el cronograma de exmenes peridicos apropiado para usted. Esta es una buena oportunidad para consultarlo sobre cmo prevenir enfermedades y [place] sano. Adems de los controles, hay muchas otras cosas que puede hacer usted mismo. Los expertos han realizado numerosas investigaciones [employer] cambios en el estilo de vida y las medidas de prevencin que, [place], lo ayudarn a mantenerse sano. Solicite a su mdico ms informacin. EL PESO Y LA DIETA Consuma una dieta saludable.   Asegrese de [employer] verduras, frutas, productos lcteos de bajo contenido de [place] y Advertising account planner.  No consuma muchos alimentos de alto contenido de grasas slidas, azcares agregados o sal.  Realice actividad fsica con regularidad. Esta es una de las prcticas ms importantes que puede hacer por su salud.  La mayora de los adultos deben hacer ejercicio durante al menos 113mnutos por semana. El ejercicio debe aumentar la frecuencia cardaca y pActorla transpiracin (ejercicio de i[place].  La mayora de los adultos tambin deben hField seismologistejercicios de elongacin al mToysRusveces a la semana. Agregue esto al su plan de ejercicio de intensidad moderada. Mantenga un peso saludable.   El ndice de masa corporal ([facility] es una medida que puede utilizarse para identificar posibles problemas de pBrandon Proporciona una estimacin de la grasa corporal basndose en el peso y la altura. Su mdico puede ayudarle a dRadiation protection practitionerIAgencyy a lScientist, forensico mTheatre managerun peso  saludable.  Para las mujeres de 20aos o ms:  Un IOcean View Psychiatric Health Facilitymenor de 18,5 se considera bajo peso.  Un IGalion Community Hospitalentre 18,5 y 24,9 es normal.  Un ITennova Healthcare - Clarksvilleentre 25 y 29,9 se considera sobrepeso.  Un IMC de 30 o ms se considera obesidad. Observe los niveles de colesterol y lpidos en la sangre.   Debe comenzar a rEnglish as a second language teacherde lpidos y cResearch officer, trade unionen la sangre a los 20aos y luego repetirlos cada 567aos  Es posible que nAutomotive engineerlos niveles de colesterol con mayor frecuencia si:  Sus niveles de lpidos y colesterol son altos.  Es mayor de 50aos.  Presenta un alto riesgo de padecer enfermedades cardacas. DETECCIN DE CNCER Cncer de pulmn   Se recomienda realizar exmenes de deteccin de cncer de pulmn a personas adultas entre 519y 843aos que estn en riesgo de dHorticulturist, commercialde pulmn por sus antecedentes de consumo de tabaco.  Se recomienda una tomografa computarizada de baja dosis de los pLiberty Mediaaos a las personas que:  Fuman actualmente.  Hayan dejado el hbito en algn momento en los ltimos 15aos.  Hayan fumado durante 30aos un paquete diario. Un paquete-ao equivale a fumar un promedio de un paquete de cigarrillos diario durante un ao.  Los exmenes de deteccin anuales deben continuar hasta que hayan pasado 15aos desde que dej de fumar.  Ya no debern realizarse si tiene un problema de salud que le impida recibir tratamiento para eScience writerde pulmn. Cncer de mama   Practique la autoconciencia de la mama. Esto significa reconocer la apariencia normal de sus mamas y cmo las siente.  Tambin significa realizar  autoexmenes regulares de [employer]. Informe a su mdico sobre cualquier cambio, sin importar cun pequeo sea.  Si tiene entre 20 y 64 aos, un mdico debe realizarle un examen clnico de las mamas como parte del examen regular de [place], cada 1 a 3aos.  Si tiene 40aos o ms, debe Information systems manager clnico de las  [employer]. Tambin considere realizarse una [place] (Trainer) todos los [place].  Si tiene antecedentes familiares de cncer de mama, hable con su mdico para someterse a un estudio gentico.  Si tiene alto riesgo de Chief Financial Officer de mama, hable con su mdico para someterse a Public house manager y [employer].  La evaluacin del gen del cncer de mama (BRCA) se recomienda a mujeres que tengan familiares con cnceres relacionados con el BRCA. Los cnceres relacionados con el BRCA incluyen los siguientes:  Mama.  Ovario.  Trompas.  Cnceres de peritoneo.  Los resultados de la evaluacin determinarn la necesidad de asesoramiento gentico y de [place] de BRCA1 y BRCA2. Cncer de cuello del tero  El mdico puede recomendarle que se haga pruebas peridicas de deteccin de cncer de los rganos de la pelvis (ovarios, tero y vagina). Estas pruebas incluyen un examen plvico, que abarca controlar si se produjeron cambios microscpicos en la superficie del cuello del tero (prueba de Papanicolaou). Pueden recomendarle que se haga estas pruebas cada 3aos, a partir de los 21aos.  A las mujeres que tienen entre 30 y 2aos, los mdicos pueden recomendarles que se sometan a exmenes plvicos y pruebas de Papanicolaou cada 55aos, o a la prueba de Papanicolaou y el examen plvico en combinacin con estudios de deteccin del virus del papiloma humano (VPH) cada 5aos. Algunos tipos de VPH aumentan el riesgo de Chief Financial Officer de cuello del tero. La prueba para la deteccin del VPH tambin puede realizarse a mujeres de cualquier edad cuyos resultados de la prueba de Papanicolaou no sean claros.  Es posible que otros mdicos no recomienden exmenes de deteccin a mujeres no embarazadas que se consideran sujetos de bajo riesgo de Chief Financial Officer de pelvis y que no tienen sntomas. Pregntele al mdico si un examen plvico de deteccin es adecuado para  usted.  Si ha recibido un tratamiento para Science writer cervical o una enfermedad que podra causar cncer, necesitar realizarse una prueba de Papanicolaou y controles durante al menos 93 aos de concluido el [place]. Si no se ha hecho el Papanicolaou con regularidad, debern volver a evaluarse los factores de riesgo (como tener un nuevo compaero sexual), para Teacher, adult education si debe realizarse los estudios nuevamente. Algunas mujeres sufren problemas mdicos que aumentan la probabilidad de Museum/gallery curator cncer de cuello del tero. En estos casos, el mdico podr [employer] se realicen controles y pruebas de Papanicolaou con ms frecuencia. Cncer colorrectal   Este tipo de cncer puede detectarse y a menudo prevenirse.  Por lo general, los estudios de rutina se deben Medical laboratory scientific officer a Field seismologist a Proofreader de los 86 aos y [place] 27 aos.  Sin embargo, el mdico podr aconsejarle que lo haga antes, si tiene factores de riesgo para el cncer de colon.  Tambin puede recomendarle que use un kit de prueba para Hydrologist en la materia fecal.  Es posible que se use una pequea cmara en el extremo de un tubo para examinar directamente el colon (sigmoidoscopia o colonoscopia) a fin de Hydrographic surveyor formas tempranas de cncer colorrectal.  Los exmenes de rutina generalmente comienzan a  los 50aos.  El examen directo del colon se debe repetir cada 5 a 10aos hasta los 75aos. Sin embargo, es posible que se realicen exmenes con mayor frecuencia, si se detectan formas tempranas de plipos precancerosos o pequeos bultos. Cncer de piel   Revise la piel de la cabeza a los pies con regularidad.  Informe a su mdico si aparecen nuevos lunares o los que tiene se modifican, especialmente en su forma y color.  Tambin notifique al mdico si tiene un lunar que es ms grande que el tamao de una goma de lpiz.  Siempre use pantalla solar. Aplique pantalla solar de [place] y repetida a lo largo del  Training and development officer.  Protjase usando mangas y [employer], un sombrero de ala ancha y gafas para el sol, siempre que se encuentre en el exterior. ENFERMEDADES CARDACAS, DIABETES E HIPERTENSIN ARTERIAL  La hipertensin arterial causa enfermedades cardacas y [place] el riesgo de ictus. La hipertensin arterial es ms probable en los siguientes casos:  Las personas que tienen la presin arterial en el extremo del rango normal (100-139/85-89 mm Hg).  Las personas con sobrepeso u obesidad.  Las Retail banker.  Si usted tiene entre 18 y 39 aos, debe medirse la presin arterial cada 3 a 5 aos. Si usted tiene 40 aos o ms, debe medirse la presin arterial [employer]. Debe medirse la presin arterial dos veces: una vez cuando est en un hospital o una clnica y la otra vez cuando est en otro sitio. Registre el promedio de [employer]. Para controlar su presin arterial cuando no est en un hospital o [place], puede usar lo siguiente:  Ardelia Mems mquina automtica para medir la presin arterial en una farmacia.  Un monitor para medir la presin arterial en el hogar.  Si tiene entre 16 y 61 aos, consulte a su mdico si debe tomar aspirina para prevenir el ictus.  Realcese exmenes de deteccin de la diabetes con regularidad. Esto incluye la toma de [place] de sangre para controlar el nivel de azcar en la sangre durante el [place].  Si tiene un peso normal y un bajo riesgo de padecer diabetes, realcese este anlisis cada tres aos despus de los 45aos.  Si tiene sobrepeso y un alto riesgo de padecer diabetes, considere someterse a este anlisis antes o con mayor frecuencia. PREVENCIN DE INFECCIONES HepatitisB   Si tiene un riesgo ms alto de Museum/gallery curator hepatitis B, debe someterse a un examen de deteccin de este virus. Se considera que tiene un alto riesgo de Museum/gallery curator hepatitis B si:  Naci en un pas donde la hepatitis B es frecuente. Pregntele a su mdico qu pases  son considerados de Public affairs consultant.  Sus padres nacieron en un pas de alto riesgo y usted no recibi una vacuna que lo proteja contra la hepatitis B (vacuna contra la hepatitis B).  [place].  [place] agujas para inyectarse drogas.  Vive con alguien que tiene hepatitis B.  Ha tenido sexo con alguien que tiene hepatitis B.  Recibe tratamiento de hemodilisis.  Toma ciertos medicamentos para el cncer, trasplante de rganos y afecciones autoinmunitarias. Hepatitis C   Se recomienda un anlisis de [place] para:  Todos los que nacieron entre 1945 y [phone].  Todas las personas que tengan un riesgo de haber contrado hepatitis C. Enfermedades de transmisin sexual (ETS).   Debe realizarse pruebas de deteccin de enfermedades de transmisin sexual (ETS), incluidas gonorrea y clamidia si:  Es sexualmente [place] y es Garment/textile technologist  de 24aos.  Es mayor de 24aos, y Investment banker, operational informa que corre riesgo de tener este tipo de infecciones.  La actividad sexual ha cambiado desde que le hicieron la ltima prueba de deteccin y tiene un riesgo mayor de Best boy clamidia o Radio broadcast assistant. Pregntele al mdico si usted tiene riesgo.  Si no tiene el VIH, pero corre riesgo de infectarse por el virus, se recomienda tomar diariamente un medicamento recetado para evitar la infeccin. Esto se conoce como profilaxis previa a la exposicin. Se considera que est en riesgo si:  Es [place] sexualmente y no [place] preservativos habitualmente o no conoce el estado del VIH de sus Advertising copywriter.  Se inyecta drogas.  Es [place] sexualmente con Ardelia Mems pareja que tiene VIH. Consulte a su mdico para saber si tiene un alto riesgo de infectarse por el VIH. Si opta por comenzar la profilaxis previa a la exposicin, primero debe realizarse anlisis de deteccin del VIH. Luego, le harn anlisis cada 52mses mientras est tomando los medicamentos para la profilaxis previa a la exposicin. [facility] Si es premenopusica y puede quedar  eWhitmore solicite a su mdico asesoramiento previo a la concepcin.  Si puede quedar embarazada, tome 400 a [id number](mcg) de cido fAnheuser-Busch  Si desea evitar el embarazo, hable con su mdico sobre el control de la natalidad (anticoncepcin). OSTEOPOROSIS Y MENOPAUSIA  La osteoporosis es una enfermedad en la que los huesos pierden los minerales y la fuerza por el avance de la edad. El resultado pueden ser fracturas graves en los hRyan El riesgo de osteoporosis puede identificarse con uArdelia Memsprueba de densidad sea.  Si tiene 65aos o ms, o si est en riesgo de sufrir osteoporosis y fracturas, pregunte a su mdico si debe someterse a exmenes.  Consulte a su mdico si debe tomar un suplemento de calcio o de vitamina D para reducir el riesgo de osteoporosis.  La menopausia puede presentar ciertos sntomas fsicos y rGaffer  La terapia de reemplazo hormonal puede reducir algunos de estos sntomas y rGaffer Consulte a su mdico para saber si la terapia de reemplazo hormonal es conveniente para usted. INSTRUCCIONES PARA EL CUIDADO EN EL HOGAR  Realcese los estudios de rutina de la salud, dentales y de lPublic librarian  [place]  No consuma ningn producto que contenga tabaco, lo que incluye cigarrillos, tabaco de mHigher education careers advisero cPsychologist, sport and exercise  Si est embarazada, no beba alcohol.  Si est amamantando, reduzca el consumo de alcohol y la frecuencia con la que consume.  Si es mujer y no est embarazada limite el consumo de alcohol a no ms de 1 medida por da. Una medida equivale a 12onzas de cerveza, 5onzas de vino o 1onzas de bebidas alcohlicas de alta graduacin.  No consuma drogas.  No comparta agujas.  Solicite ayuda a su mdico si necesita apoyo o informacin para abandonar las drogas.  Informe a su mdico si a menudo se siente deprimido.  Notifique a su mdico si alguna vez ha sido vctima de abuso o si no se siente seguro  en su hogar. Esta informacin no tiene cMarine scientistel consejo del mdico. Asegrese de hacerle al mdico cualquier pregunta que tenga. Document Released: 02/06/2011 Document Revised: 03/10/2014 Document Reviewed: 11/21/2014 Elsevier Interactive Patient Education  2017 EReynolds [employer]

## 2016-01-23 NOTE — Progress Notes (Signed)
BP 122/84   [name] 80   Temp 97.9 F (36.6 C) (Oral)   Ht 5\' 3"  (1.6 m)   Wt 158 lb 8 oz (71.9 kg)   BMI 28.08 kg/m    CC: CPE Subjective:    Patient ID: [name], female    DOB: [birth year], 81 y.o.   MRN: [id number]  HPI: [name] is a 81 y.o. female presenting on 01/23/2016 for Annual Exam (not Medicare)   Recently established, here for annual exam. Walks with walker. S/p synvisc knee injections by Dr Patsy Lager 11/01/2015. Planning returning 6 mo.   Noticing trouble with insomnia. Taking sleepy tea at night as well as melatonin 3-6mg , not effective. Nocturia Q2 hrs at night.   Not happy in , wants to return to [place] however she has no family to help care for her there. Considering moving to [place].   Preventative: Asked to bring [place] copy of all preventative healthcare in [place] including immunizations. Colon cancer screening - s/p colonoscopy 2013 WNL in [place] per patient Lung cancer screening - non smoker Breast cancer screening - mammo 2012 WNL. Declines rpt Well woman exam - s/p hysterectomy with BSO. Underwent premarin treatment after menopause.  DEXA scan - 2013 normal per patient Flu shot - declines due to bad reaction in the past Tetanus shot - thinks UTD Prevnar today  Shingles shot - declines  Advanced directive discussion - does not have living will. Discussed. Packet provided today.  Non-smoker - daughter smokes outside  Alcohol - none  Lives with daughter [name]  Originally from [place], moved 2013 G5P5 Activity: stationary bicycle 30 min /day Diet: good water, fruits/vegetables daily  Relevant past medical, surgical, family and social history reviewed and updated as indicated. Interim medical history since our last visit reviewed. Allergies and medications reviewed and updated. Current Outpatient Prescriptions on File Prior to Visit  Medication Sig  . atenolol (TENORMIN) 50 MG tablet Take 1 tablet (50 mg total) by mouth daily.  Marland Kitchen  Bioflavonoid Products (BIOFLEX) TABS Take 1 tablet by mouth daily.  . Insulin Glargine (LANTUS SOLOSTAR) 100 UNIT/ML Solostar Pen Inject 20 Units into the skin daily.   Marland Kitchen levothyroxine (SYNTHROID, LEVOTHROID) 75 MCG tablet Take 1 tablet (75 mcg total) by mouth daily before breakfast.  . metFORMIN (GLUCOPHAGE) 500 MG tablet Take 1 tablet (500 mg total) by mouth daily with breakfast.  . rivaroxaban (XARELTO) 20 MG TABS tablet Take 1 tablet (20 mg total) by mouth daily with supper.   No current facility-administered medications on file prior to visit.     Review of Systems  Constitutional: Negative for activity change, appetite change, chills, fatigue, fever and unexpected weight change.  HENT: Negative for hearing loss.   Eyes: Negative for visual disturbance.  Respiratory: Negative for cough, chest tightness, shortness of breath and wheezing.   Cardiovascular: Negative for chest pain, palpitations and leg swelling.  Gastrointestinal: Negative for abdominal distention, abdominal pain, blood in stool, constipation, diarrhea, nausea and vomiting.  Genitourinary: Negative for difficulty urinating and hematuria.  Musculoskeletal: Negative for arthralgias, myalgias and neck pain.  Skin: Negative for rash.  Neurological: Negative for dizziness, seizures, syncope and headaches.  Hematological: Negative for adenopathy. Does not bruise/bleed easily.  Psychiatric/Behavioral: Negative for dysphoric mood. The patient is not nervous/anxious.    Per HPI unless specifically indicated in ROS section     Objective:    BP 122/84   [name] 80   Temp 97.9 F (36.6 C) (Oral)   Ht 5'  3" (1.6 m)   Wt 158 lb 8 oz (71.9 kg)   BMI 28.08 kg/m   Wt Readings from Last 3 Encounters:  01/23/16 158 lb 8 oz (71.9 kg)  12/25/15 156 lb (70.8 kg)  11/01/15 159 lb 8 oz (72.3 kg)    Physical Exam  Constitutional: She is oriented to person, place, and time. She appears well-developed and well-nourished. No distress.    HENT:  Head: Normocephalic and atraumatic.  Right Ear: Hearing, tympanic membrane, external ear and ear canal normal.  Left Ear: Hearing, tympanic membrane, external ear and ear canal normal.  Nose: Nose normal.  Mouth/Throat: Uvula is midline, oropharynx is clear and moist and mucous membranes are normal. No oropharyngeal exudate, posterior oropharyngeal edema or posterior oropharyngeal erythema.  Eyes: Conjunctivae and EOM are normal. Pupils are equal, round, and reactive to light. No scleral icterus.  Neck: Normal range of motion. Neck supple.  Cardiovascular: Normal rate, regular rhythm, normal heart sounds and intact distal pulses.   No murmur heard. Pulses:      Radial pulses are 2+ on the right side, and 2+ on the left side.  Pulmonary/Chest: Effort normal and breath sounds normal. No respiratory distress. She has no wheezes. She has no rales.  Breast exam - deferred  Abdominal: Soft. Bowel sounds are normal. She exhibits no distension and no mass. There is no tenderness. There is no rebound and no guarding.  Musculoskeletal: Normal range of motion. She exhibits no edema.  Lymphadenopathy:    She has no cervical adenopathy.  Neurological: She is alert and oriented to person, place, and time.  CN grossly intact, station and gait intact  Skin: Skin is warm and dry. No rash noted.  Psychiatric: She has a normal mood and affect. Her behavior is normal. Judgment and thought content normal.  Nursing note and vitals reviewed.  Results for orders placed or performed in visit on 01/17/16  CBC with Differential/Platelet  Result Value Ref Range   WBC 7.9 4.0 - 10.5 K/uL   RBC 4.65 3.87 - 5.11 Mil/uL   Hemoglobin 12.2 12.0 - 15.0 g/dL   HCT 16.137.6 09.636.0 - 04.546.0 %   MCV 80.9 78.0 - 100.0 fl   MCHC 32.5 30.0 - 36.0 g/dL   RDW 40.915.4 81.111.5 - 91.415.5 %   Platelets 254.0 150.0 - 400.0 K/uL   Neutrophils Relative % 67.9 43.0 - 77.0 %   Lymphocytes Relative 23.5 12.0 - 46.0 %   Monocytes Relative  6.4 3.0 - 12.0 %   Eosinophils Relative 1.7 0.0 - 5.0 %   Basophils Relative 0.5 0.0 - 3.0 %   Neutro Abs 5.4 1.4 - 7.7 K/uL   Lymphs Abs 1.9 0.7 - 4.0 K/uL   Monocytes Absolute 0.5 0.1 - 1.0 K/uL   Eosinophils Absolute 0.1 0.0 - 0.7 K/uL   Basophils Absolute 0.0 0.0 - 0.1 K/uL  Hemoglobin A1c  Result Value Ref Range   Hgb A1c MFr Bld 7.2 (H) 4.6 - 6.5 %  Ferritin  Result Value Ref Range   Ferritin 7.7 (L) 10.0 - 291.0 ng/mL  IBC panel  Result Value Ref Range   Iron 77 42 - 145 ug/dL   Transferrin 782.9286.0 562.1212.0 - 360.0 mg/dL   Saturation Ratios 30.819.2 (L) 20.0 - 50.0 %  Vitamin B12  Result Value Ref Range   Vitamin B-12 477 211 - 911 pg/mL  Folate  Result Value Ref Range   Folate 15.6 >5.9 ng/mL      Assessment &  Plan:   Problem List Items Addressed This Visit    Advanced care planning/counseling discussion    Advanced directive discussion - does not have living will. Discussed. Packet provided today.       Atrial fibrillation (HCC)    Sounds regular today. Continue xarelto.       Dyslipidemia    Predominantly low HDL. Off statin, LDL 69.      Health maintenance examination - Primary    Preventative protocols reviewed and updated unless pt declined. Discussed healthy diet and lifestyle.       Hypothyroidism    Chronic, stable. Continue current regimen.       Insomnia    [place] rec trial melatonin 10mg  daily or benadryl 25mg . If ineffective, consider Rx.       Iron deficiency    Without anemia. rec iron rich foods, handout provided. Discussed OTC oral iron supplement if needed.       Osteopenia    I have asked them to bring latest DEXA scan. Per patient, normal.       Primary osteoarthritis of both knees    S/p synvisc injections earlier this year, planning to f/u with SM in 6 months.       Type 2 diabetes mellitus with complication, with long-term current use of insulin (HCC)    Chronic, stable. Continue metformin and  lantus.        Other Visit Diagnoses    Need for vaccination with 13-polyvalent pneumococcal conjugate vaccine       Relevant Orders   Pneumococcal conjugate vaccine 13-valent (Completed)       Follow up plan: Return in about 6 months (around 07/22/2016) for follow up visit.  Eustaquio BoydenJavier Crista Nuon, MD

## 2016-01-26 DIAGNOSIS — E611 Iron deficiency: Secondary | ICD-10-CM | POA: Insufficient documentation

## 2016-01-26 DIAGNOSIS — Z0000 Encounter for general adult medical examination without abnormal findings: Secondary | ICD-10-CM | POA: Insufficient documentation

## 2016-01-26 DIAGNOSIS — Z7189 Other specified counseling: Secondary | ICD-10-CM | POA: Insufficient documentation

## 2016-01-26 DIAGNOSIS — Z0001 Encounter for general adult medical examination with abnormal findings: Secondary | ICD-10-CM | POA: Insufficient documentation

## 2016-01-26 DIAGNOSIS — G4700 Insomnia, unspecified: Secondary | ICD-10-CM | POA: Insufficient documentation

## 2016-01-26 NOTE — Assessment & Plan Note (Signed)
Chronic, stable. Continue current regimen. 

## 2016-01-26 NOTE — Assessment & Plan Note (Signed)
Predominantly low HDL. Off statin, LDL 69.

## 2016-01-26 NOTE — Assessment & Plan Note (Signed)
Sounds regular today Continue xarelto 

## 2016-01-26 NOTE — Assessment & Plan Note (Signed)
I have asked them to bring latest DEXA scan. Per patient, normal.

## 2016-01-26 NOTE — Assessment & Plan Note (Signed)
Advanced directive discussion - does not have living will. Discussed. Packet provided today.

## 2016-01-26 NOTE — Assessment & Plan Note (Signed)
Preventative protocols reviewed and updated unless pt declined. Discussed healthy diet and lifestyle.  

## 2016-01-26 NOTE — Assessment & Plan Note (Signed)
S/p synvisc injections earlier this year, planning to f/u with SM in 6 months.

## 2016-01-26 NOTE — Assessment & Plan Note (Signed)
Without anemia. rec iron rich foods, handout provided. Discussed OTC oral iron supplement if needed.

## 2016-01-26 NOTE — Assessment & Plan Note (Signed)
Chronic, stable. Continue metformin and lantus.

## 2016-01-26 NOTE — Assessment & Plan Note (Signed)
Previously treated [place]an[place] in [place]. rec trial melatonin 10mg  daily or benadryl 25mg . If ineffective, consider Rx.

## 2016-04-14 ENCOUNTER — Other Ambulatory Visit: Payer: Self-pay

## 2016-04-14 MED ORDER — INSULIN GLARGINE 100 UNIT/ML SOLOSTAR PEN: 20.0000 [IU] | PEN_INJECTOR | Freq: Every day | SUBCUTANEOUS | 1 refills | Status: DC

## 2016-04-14 NOTE — Telephone Encounter (Signed)
Vernona RiegerLaura request ref[facility] . Pt seen 01/23/16 and advised to continue taking lantus;pt takes 20 units daily. Pt has f/u appt withi Dr Diamond NickelG. Laura said usually gets one box of pens.advise refill done as requested. Vernona RiegerLaura will ck with pharmacy.

## 2016-04-29 ENCOUNTER — Encounter (INDEPENDENT_AMBULATORY_CARE_PROVIDER_SITE_OTHER): Payer: Self-pay

## 2016-04-29 ENCOUNTER — Telehealth: Payer: Self-pay

## 2016-04-29 MED ORDER — INSULIN GLARGINE 100 UNIT/ML SOLOSTAR PEN: 20.0000 [IU] | PEN_INJECTOR | Freq: Every day | SUBCUTANEOUS | 3 refills | Status: DC

## 2016-04-29 MED ORDER — TRAZODONE HCL 50 MG PO TABS: 25.0000 mg | ORAL_TABLET | Freq: Every evening | ORAL | 3 refills | Status: DC | PRN

## 2016-04-29 NOTE — Telephone Encounter (Signed)
Deanna RiegerLaura pt's daughter began speaking with Jaclynn GuarneriLinda front officer supervisor; she said that pt gets 75 day supply of lantus and pays for 90 day supply; Deanna RiegerLaura request the instructions changed to 25 units daily instead of 20 units and this will allow pt to appropriate units for the amt of money pt is paying. Pt has been taking Benadryl to help her sleep at night; pt is visiting her son out of state and pt is not sleeping. Bonita QuinLinda said pt has to have med to sleep even thou pt is diabetic and has heart problems.pt has not slept in 2 nights. Linda request cb. [facility].

## 2016-04-29 NOTE — Telephone Encounter (Signed)
Have sent updated lantus Rx to pharmacy although I don't understand how 25u (24mL for 3 months) gives better option than 20u (18mL for 3 months)? plz notify - let's trial trazodone nightly for sleep 25-50mg  (1/2-1 tab) Update us with effect.

## 2016-04-30 NOTE — Telephone Encounter (Signed)
Message left advising patient's daughter.  

## 2016-05-02 ENCOUNTER — Encounter: Payer: Self-pay | Admitting: Family Medicine

## 2016-05-05 MED ORDER — RIVAROXABAN 20 MG PO TABS: 20.0000 mg | ORAL_TABLET | Freq: Every day | ORAL | 3 refills | Status: DC

## 2016-05-05 NOTE — Telephone Encounter (Signed)
PLEASE NOTE: All timestamps contained within this report are represented as [place] Standard Time. CONFIDENTIALTY NOTICE: This fax transmission is intended only for the addressee. It contains information that is legally privileged, confidential or otherwise protected from use or disclosure. If you are not the intended recipient, you are strictly prohibited from reviewing, disclosing, copying using or disseminating any of this information or taking any action in reliance on or regarding this information. If you have received this fax in error, please notify us immediately by telephone so that we can arrange for its return to us. Phone: [phone], Toll-Free: [phone], Fax: [phone] Page: 1 of 1 Call Id: [id number] [facility] - Client TELEPHONE ADVICE RECORD [facility] Patient Name: Deanna PulseLUCILA Marik Gender: Female DOB: 11/19/1934 Age: [age] Y 6 M 2 D Return Phone Number: [phone] (Primary) City/State/Zip: [place] [address] Client [facility] - Client Client Site [facility] Day Physician Eustaquio BoydenGutierrez, Javier - MD Who Is Calling Patient / Member / Family / Caregiver Call Type Triage / Clinical Caller Name Cherly AndersonLaura Leath Relationship To Patient Daughter Return Phone Number [phone] (Primary) Chief Complaint Prescription Refill or Medication Request (non symptomatic) Reason for Call Symptomatic / Request for Health Information Initial Comment Caller states her mother needs a refill on her heart medication. Appointment Disposition EMR Appointment Not Necessary Info pasted into Epic No Nurse Assessment Nurse: Dareen PianoAnderson, RN, Marcelino DusterMichelle Date/Time Lamount Cohen(Eastern Time): 05/02/2016 11:27:27 PM Confirm and document reason for call. If symptomatic, describe symptoms. ---caller states was able to get rx refill Guidelines Guideline Title Affirmed Question Disp. Time Lamount Cohen(Eastern Time) Disposition Final  User 05/02/2016 11:27:53 PM Clinical Call Yes Dareen PianoAnderson, RN, Marcelino DusterMichelle

## 2016-05-12 ENCOUNTER — Encounter: Payer: Self-pay | Admitting: Family Medicine

## 2016-05-12 ENCOUNTER — Ambulatory Visit (INDEPENDENT_AMBULATORY_CARE_PROVIDER_SITE_OTHER): Payer: BLUE CROSS/BLUE SHIELD | Admitting: Family Medicine

## 2016-05-12 VITALS — BP 122/72 | HR 80 | Temp 97.8°F | Ht 63.0 in | Wt 150.0 lb

## 2016-05-12 DIAGNOSIS — M170 Bilateral primary osteoarthritis of knee: Secondary | ICD-10-CM | POA: Diagnosis not present

## 2016-05-12 DIAGNOSIS — E118 Type 2 diabetes mellitus with unspecified complications: Secondary | ICD-10-CM | POA: Diagnosis not present

## 2016-05-12 DIAGNOSIS — Z794 Long term (current) use of insulin: Secondary | ICD-10-CM

## 2016-05-12 MED ORDER — HYLAN G-F 20 48 MG/6ML IX SOSY
48.0000 mg | PREFILLED_SYRINGE | Freq: Once | INTRA_ARTICULAR | Status: AC
  Administered 2016-05-12: 48 mg via INTRA_ARTICULAR

## 2016-05-12 NOTE — Progress Notes (Signed)
Pre visit review using our clinic review tool, if applicable. No additional management support is needed unless otherwise documented below in the visit note. 

## 2016-05-12 NOTE — Progress Notes (Signed)
   Dr. Karleen HampshireSpencer T. Avid Guillette, MD, CAQ [facility] [address] [place], [address] Phone: [phone] Fax: [phone]  05/12/2016  Patient: [name], MRN: [id number], DOB: 09/28/1934, 82 y.o.  Primary Physician:  Eustaquio BoydenJavier Gutierrez, MD   Chief Complaint  Patient presents with  . Knee Pain    Bilateral   Subjective:   Deanna PulseLucila Batten is a 82 y.o. very pleasant female patient who presents with the following:  11/01/2015 Synivisc-One injections.  Bilateral synvisc injections - good response to prior injections.   Knee Injection: Synvisc-One, RIGHT Patient verbally consented to procedure. Risks (including infection), benefits, and alternatives explained. Sterilely prepped with Chloraprep. Ethyl cholride used for anesthesia, then 7 cc of Lidocaine 1% used for anesthesia in the anterolateral position. Reprepped with Chloraprep.  Anterolateral approach used to inject joint without difficulty, injected with Synvisc-One, 6 mL. No complications with procedure and tolerated well.   Knee Injection: Synvisc-One, LEFT Patient verbally consented to procedure. Risks (including infection), benefits, and alternatives explained. Sterilely prepped with Chloraprep. Ethyl cholride used for anesthesia, then 7 cc of Lidocaine 1% used for anesthesia in the anterolateral position. Reprepped with Chloraprep.  Anterolateral approach used to inject joint without difficulty, injected with Synvisc-One, 6 mL. No complications with procedure and tolerated well.   Signed,  Elpidio GaleaSpencer T. Cameo Schmiesing, MD

## 2016-05-13 LAB — HEMOGLOBIN A1C
Hgb A1c MFr Bld: 6.8 % — ABNORMAL HIGH (ref <5.7)
Mean Plasma Glucose: 148 mg/dL

## 2016-08-31 LAB — HM DIABETES EYE EXAM

## 2016-09-29 ENCOUNTER — Encounter: Payer: Self-pay | Admitting: Family Medicine

## 2016-09-29 ENCOUNTER — Ambulatory Visit (INDEPENDENT_AMBULATORY_CARE_PROVIDER_SITE_OTHER): Payer: BLUE CROSS/BLUE SHIELD | Admitting: Family Medicine

## 2016-09-29 VITALS — BP 124/72 | HR 80 | Temp 98.1°F | Wt 145.2 lb

## 2016-09-29 DIAGNOSIS — Z8673 Personal history of transient ischemic attack (TIA), and cerebral infarction without residual deficits: Secondary | ICD-10-CM

## 2016-09-29 DIAGNOSIS — E039 Hypothyroidism, unspecified: Secondary | ICD-10-CM

## 2016-09-29 DIAGNOSIS — Z794 Long term (current) use of insulin: Secondary | ICD-10-CM

## 2016-09-29 DIAGNOSIS — M170 Bilateral primary osteoarthritis of knee: Secondary | ICD-10-CM | POA: Diagnosis not present

## 2016-09-29 DIAGNOSIS — E785 Hyperlipidemia, unspecified: Secondary | ICD-10-CM | POA: Diagnosis not present

## 2016-09-29 DIAGNOSIS — F5101 Primary insomnia: Secondary | ICD-10-CM

## 2016-09-29 DIAGNOSIS — I480 Paroxysmal atrial fibrillation: Secondary | ICD-10-CM | POA: Diagnosis not present

## 2016-09-29 DIAGNOSIS — E118 Type 2 diabetes mellitus with unspecified complications: Secondary | ICD-10-CM

## 2016-09-29 LAB — BASIC METABOLIC PANEL
BUN: 18 mg/dL (ref 6–23)
CO2: 28 mEq/L (ref 19–32)
Calcium: 9 mg/dL (ref 8.4–10.5)
Chloride: 104 mEq/L (ref 96–112)
Creatinine, Ser: 0.82 mg/dL (ref 0.40–1.20)
GFR: 71.31 mL/min (ref >60.00)
Glucose, Bld: 107 mg/dL — ABNORMAL HIGH (ref 70–99)
POTASSIUM: 4.2 meq/L (ref 3.5–5.1)
SODIUM: 138 meq/L (ref 135–145)

## 2016-09-29 LAB — TSH: TSH: 1.65 u[IU]/mL (ref 0.35–4.50)

## 2016-09-29 LAB — HEMOGLOBIN A1C: HEMOGLOBIN A1C: 7.2 % — AB (ref 4.6–6.5)

## 2016-09-29 MED ORDER — TRAZODONE HCL 50 MG PO TABS
50.0000 mg | ORAL_TABLET | Freq: Every evening | ORAL | 3 refills | Status: DC | PRN
Start: 2016-09-29 — End: 2017-03-30

## 2016-09-29 NOTE — Assessment & Plan Note (Addendum)
Appreciate SM care. S/p synvisc injections x2

## 2016-09-29 NOTE — Assessment & Plan Note (Addendum)
Chronic, stable. Continue current regimen. She is taking lantus 10u daily along with her metformin. No hypoglycemia with this. Update labs. Reviewed diabetic foot care with pt and daughter.

## 2016-09-29 NOTE — Assessment & Plan Note (Addendum)
Continue trazodone 50mg  daily - effective. Off melatonin.

## 2016-09-29 NOTE — Assessment & Plan Note (Signed)
Update TSH. Continue levothyroxine daily

## 2016-09-29 NOTE — Patient Instructions (Signed)
Return in 6 months for physical. continuar [facility].

## 2016-09-29 NOTE — Assessment & Plan Note (Signed)
Continue xarelto. H/o CVA 2014.

## 2016-09-29 NOTE — Assessment & Plan Note (Signed)
Will need to readdress statin use.

## 2016-09-29 NOTE — Progress Notes (Signed)
BP 124/72   Pulse 80   Temp 98.1 F (36.7 C) (Oral)   Wt 145 lb 4 oz (65.9 kg)   SpO2 98%   BMI 25.73 kg/m    CC: f/u visit Subjective:    Patient ID: [name], female    DOB: 06/03/1934, 82 y.o.   MRN: [id number]  HPI: [name] is a 82 y.o. female presenting on 09/29/2016 for Follow-up   Here with daughter.  Spends several months in [place].   Seen by Dr Patsy Lageropland treated with bilateral synvisc injections 10/2015 and again 05/2016. This did help - ambulating more easily. She did have a fall 1 month ago with 3 wks of coccygeal pain but that has now resolved. She is now walking with cane.   DM - regularly does  check sugars once daily fasting - 92. Compliant with antihyperglycemic regimen which includes: metformin 500mg  daily and lantus 20-25u daily (only takes 10u daily). Denies low sugars or hypoglycemic symptoms. Denies paresthesias. Last diabetic eye exam a few weeks ago.  Pneumovax: due.  Prevnar: 01/2016. Lab Results  Component Value Date   HGBA1C 6.8 (H) 05/12/2016   Diabetic Foot Exam - Simple   Simple Foot Form Diabetic Foot exam was performed with the following findings:  Yes 09/29/2016  9:55 AM  Visual Inspection No deformities, no ulcerations, no other skin breakdown bilaterally:  Yes Sensation Testing Intact to touch and monofilament testing bilaterally:  Yes Pulse Check Posterior Tibialis and Dorsalis pulse intact bilaterally:  Yes Comments      Compliant with levothyroxine without hypothyroid sxs.   Relevant past medical, surgical, family and social history reviewed and updated as indicated. Interim medical history since our last visit reviewed. Allergies and medications reviewed and updated. Outpatient Medications Prior to Visit  Medication Sig Dispense Refill  . atenolol (TENORMIN) 50 MG tablet Take 1 tablet (50 mg total) by mouth daily. 90 tablet 3  . Bioflavonoid Products (BIOFLEX) TABS Take 1 tablet by mouth daily.    . Insulin Glargine  (LANTUS SOLOSTAR) 100 UNIT/ML Solostar Pen Inject 20-25 Units into the skin daily. 24 mL 3  . levothyroxine (SYNTHROID, LEVOTHROID) 75 MCG tablet Take 1 tablet (75 mcg total) by mouth daily before breakfast. 90 tablet 3  . metFORMIN (GLUCOPHAGE) 500 MG tablet Take 1 tablet (500 mg total) by mouth daily with breakfast. 90 tablet 3  . rivaroxaban (XARELTO) 20 MG TABS tablet Take 1 tablet (20 mg total) by mouth daily with supper. 90 tablet 3  . Melatonin 10 MG CAPS Take 1 capsule by mouth at bedtime.    . traZODone (DESYREL) 50 MG tablet Take 0.5-1 tablets (25-50 mg total) by mouth at bedtime as needed for sleep. 30 tablet 3  . Sennosides 15 MG TABS Take 1 tablet by mouth every other day.     No facility-administered medications prior to visit.      Per HPI unless specifically indicated in ROS section below Review of Systems     Objective:    BP 124/72   Pulse 80   Temp 98.1 F (36.7 C) (Oral)   Wt 145 lb 4 oz (65.9 kg)   SpO2 98%   BMI 25.73 kg/m   Wt Readings from Last 3 Encounters:  09/29/16 145 lb 4 oz (65.9 kg)  05/12/16 150 lb (68 kg)  01/23/16 158 lb 8 oz (71.9 kg)    Physical Exam  Constitutional: She appears well-developed and well-nourished. No distress.  HENT:  Head: Normocephalic and atraumatic.  Right Ear: External ear normal.  Left Ear: External ear normal.  Nose: Nose normal.  Mouth/Throat: Oropharynx is clear and moist. No oropharyngeal exudate.  Eyes: Pupils are equal, round, and reactive to light. Conjunctivae and EOM are normal. No scleral icterus.  Neck: Normal range of motion. Neck supple. No thyromegaly present.  Cardiovascular: Normal rate, regular rhythm, normal heart sounds and intact distal pulses.   No murmur heard. Pulmonary/Chest: Effort normal and breath sounds normal. No respiratory distress. She has no wheezes. She has no rales.  Musculoskeletal: She exhibits no edema.  See HPI for foot exam if done  Lymphadenopathy:    She has no cervical  adenopathy.  Skin: Skin is warm and dry. No rash noted.  Psychiatric: She has a normal mood and affect.  Nursing note and vitals reviewed.  Results for orders placed or performed in visit on 09/29/16  HM DIABETES EYE EXAM  Result Value Ref Range   HM Diabetic Eye Exam No Retinopathy No Retinopathy      Assessment & Plan:   Problem List Items Addressed This Visit    Atrial fibrillation (HCC)    Continue xarelto. H/o CVA 2014.       Dyslipidemia    Off statin, not interested in restarting. Lab Results  Component Value Date   LDLCALC 69 10/25/2015         History of ischemic stroke without residual deficits    Will need to readdress statin use.       Hypothyroidism    Update TSH. Continue levothyroxine 75mcg daily      Relevant Orders   TSH   Insomnia    Continue trazodone 50mg  daily - effective. Off melatonin.      Primary osteoarthritis of both knees    Appreciate SM care. S/p synvisc injections x2      Type 2 diabetes mellitus with complication, with long-term current use of insulin (HCC) - Primary    Chronic, stable. Continue current regimen. She is taking lantus 10u daily along with her metformin. No hypoglycemia with this. Update labs. Reviewed diabetic foot care with pt and daughter.       Relevant Orders   Hemoglobin A1c   Basic metabolic panel       Follow up plan: Return in about 6 months (around 04/01/2017) for medicare wellness visit, annual exam, prior fasting for blood work.  Eustaquio BoydenJavier Brooklynne Pereida, MD

## 2016-09-29 NOTE — Assessment & Plan Note (Signed)
Off statin, not interested in restarting. Lab Results  Component Value Date   LDLCALC 69 10/25/2015

## 2016-10-18 ENCOUNTER — Other Ambulatory Visit: Payer: Self-pay | Admitting: Family Medicine

## 2016-11-28 ENCOUNTER — Other Ambulatory Visit: Payer: Self-pay | Admitting: Family Medicine

## 2017-03-19 ENCOUNTER — Telehealth: Payer: Self-pay | Admitting: Family Medicine

## 2017-03-19 NOTE — Telephone Encounter (Signed)
Copied from CRM [id number]. Topic: Quick Communication - See Telephone Encounter >> Mar 19, 2017  1:06 PM Waymon AmatoBurton, Donna F wrote: CRM for notification. See Telephone encounter for:  Pt daughter laura leach is calling to see about getting a handicapp sticker for her mom please call laura leach her daughter at [phone] once ready to pick the form up  03/19/17.

## 2017-03-19 NOTE — Telephone Encounter (Signed)
Pt last seen 09/29/16. Application for disability parking placard form in Dr Timoteo Ex[employer]'s in box.

## 2017-03-20 NOTE — Telephone Encounter (Signed)
Left message on vm per dpr notifying pt's daughter, Vernona RiegerLaura (on dpr), pt's disability placard form is ready to pick up. [Placed form at front office.]

## 2017-03-20 NOTE — Telephone Encounter (Signed)
Filled and in Lisa's box 

## 2017-03-26 ENCOUNTER — Other Ambulatory Visit: Payer: Self-pay | Admitting: Family Medicine

## 2017-03-28 ENCOUNTER — Other Ambulatory Visit: Payer: Self-pay | Admitting: Family Medicine

## 2017-03-28 ENCOUNTER — Encounter: Payer: Self-pay | Admitting: Family Medicine

## 2017-03-29 ENCOUNTER — Other Ambulatory Visit: Payer: Self-pay | Admitting: Family Medicine

## 2017-03-30 MED ORDER — RIVAROXABAN 20 MG PO TABS: 20.0000 mg | ORAL_TABLET | Freq: Every day | ORAL | 2 refills | Status: DC

## 2017-03-30 MED ORDER — TRAZODONE HCL 50 MG PO TABS: 50.0000 mg | ORAL_TABLET | Freq: Every evening | ORAL | 2 refills | Status: DC | PRN

## 2017-03-30 MED ORDER — INSULIN GLARGINE 100 UNIT/ML SOLOSTAR PEN: 20.0000 [IU] | PEN_INJECTOR | Freq: Every day | SUBCUTANEOUS | 2 refills | Status: DC

## 2017-03-30 NOTE — Telephone Encounter (Signed)
Refills sent to pharmacy. 

## 2017-06-25 ENCOUNTER — Other Ambulatory Visit: Payer: Self-pay | Admitting: Family Medicine

## 2017-06-25 ENCOUNTER — Telehealth: Payer: Self-pay | Admitting: Family Medicine

## 2017-06-25 NOTE — Telephone Encounter (Signed)
Last filled:  03/26/17, #90 Last OV:  09/29/16 Next OV:  none

## 2017-06-25 NOTE — Telephone Encounter (Signed)
Copied from CRM [phone] [phone]. Topic: Inquiry >> Jun 25, 2017 12:11 PM Yvonna Alanisobinson, Andra M wrote: Reason for CRM: Patient's daughter Vernona RiegerLaura called requesting a refill of Rivaroxaban (XARELTO) 20 MG TABS tablet for the patient. [facility] [address] [place] [place] - [address] [phone] (Phone)   [phone] (Fax).

## 2017-06-26 NOTE — Telephone Encounter (Signed)
Did not fill - see below.

## 2017-06-26 NOTE — Telephone Encounter (Signed)
Patient' daughter was called and she says she doesn't need the xarelto filled, she has 2 refills left.

## 2017-06-26 NOTE — Telephone Encounter (Signed)
See 06/25/17 refill request.

## 2017-06-26 NOTE — Telephone Encounter (Signed)
Patient's daughter called [phone], left detailed VM to return the call to the office to schedule her mother for a yearly physical, she was supposed to be seen in January, she will need to schedule an appointment and be seen in order to continue to get refills on her medications. [facility] who says the medication was sent out to another pharmacy on yesterday, [facility] [place], [place]. She says [facility], then the patient is requesting it to be sent out to other pharmacies, so nothing has been filled there in over a year. Patient's daughter, Vernona RiegerLaura, was called back and I spoke to her at this time. She said she doesn't need the Xarelto, because she has 2 refills. I asked about the appointment, she says the patient is with her for 6 months and her other daughter in [place] for 6 months. She will be her next month and will call and schedule a physical for her at that time.

## 2017-06-28 ENCOUNTER — Other Ambulatory Visit: Payer: Self-pay | Admitting: Family Medicine

## 2017-06-29 NOTE — Telephone Encounter (Signed)
Electronic refill request Last office visit 09/29/16 See phone note 06/25/17 regarding appointment needs to be scheduled

## 2017-06-29 NOTE — Telephone Encounter (Signed)
Noted  

## 2017-07-07 ENCOUNTER — Encounter (INDEPENDENT_AMBULATORY_CARE_PROVIDER_SITE_OTHER): Payer: Self-pay

## 2017-08-10 ENCOUNTER — Ambulatory Visit: Payer: BLUE CROSS/BLUE SHIELD | Admitting: Family Medicine

## 2017-08-10 ENCOUNTER — Ambulatory Visit (INDEPENDENT_AMBULATORY_CARE_PROVIDER_SITE_OTHER): Payer: BLUE CROSS/BLUE SHIELD | Admitting: Family Medicine

## 2017-08-10 ENCOUNTER — Encounter: Payer: Self-pay | Admitting: Family Medicine

## 2017-08-10 VITALS — BP 120/78 | HR 75 | Temp 98.3°F | Ht 62.75 in | Wt 140.2 lb

## 2017-08-10 DIAGNOSIS — Z7189 Other specified counseling: Secondary | ICD-10-CM | POA: Diagnosis not present

## 2017-08-10 DIAGNOSIS — Z794 Long term (current) use of insulin: Secondary | ICD-10-CM

## 2017-08-10 DIAGNOSIS — Z8673 Personal history of transient ischemic attack (TIA), and cerebral infarction without residual deficits: Secondary | ICD-10-CM | POA: Diagnosis not present

## 2017-08-10 DIAGNOSIS — E118 Type 2 diabetes mellitus with unspecified complications: Secondary | ICD-10-CM

## 2017-08-10 DIAGNOSIS — M170 Bilateral primary osteoarthritis of knee: Secondary | ICD-10-CM

## 2017-08-10 DIAGNOSIS — M8580 Other specified disorders of bone density and structure, unspecified site: Secondary | ICD-10-CM

## 2017-08-10 DIAGNOSIS — E039 Hypothyroidism, unspecified: Secondary | ICD-10-CM | POA: Diagnosis not present

## 2017-08-10 DIAGNOSIS — I4891 Unspecified atrial fibrillation: Secondary | ICD-10-CM

## 2017-08-10 DIAGNOSIS — E785 Hyperlipidemia, unspecified: Secondary | ICD-10-CM

## 2017-08-10 DIAGNOSIS — F5101 Primary insomnia: Secondary | ICD-10-CM

## 2017-08-10 DIAGNOSIS — Z0000 Encounter for general adult medical examination without abnormal findings: Secondary | ICD-10-CM

## 2017-08-10 DIAGNOSIS — R634 Abnormal weight loss: Secondary | ICD-10-CM | POA: Insufficient documentation

## 2017-08-10 DIAGNOSIS — Z23 Encounter for immunization: Secondary | ICD-10-CM | POA: Diagnosis not present

## 2017-08-10 DIAGNOSIS — I482 Chronic atrial fibrillation: Secondary | ICD-10-CM

## 2017-08-10 DIAGNOSIS — I4821 Permanent atrial fibrillation: Secondary | ICD-10-CM

## 2017-08-10 MED ORDER — TRAZODONE HCL 50 MG PO TABS: 50.0000 mg | ORAL_TABLET | Freq: Every evening | ORAL | 2 refills | Status: DC | PRN

## 2017-08-10 MED ORDER — RIVAROXABAN 20 MG PO TABS: 20.0000 mg | ORAL_TABLET | Freq: Every day | ORAL | 3 refills | Status: DC

## 2017-08-10 MED ORDER — ATENOLOL 50 MG PO TABS: 50.0000 mg | ORAL_TABLET | Freq: Every day | ORAL | 3 refills | Status: DC

## 2017-08-10 MED ORDER — METFORMIN HCL 500 MG PO TABS: 500.0000 mg | ORAL_TABLET | Freq: Every day | ORAL | 3 refills | Status: DC

## 2017-08-10 MED ORDER — LEVOTHYROXINE SODIUM 75 MCG PO TABS: 75.0000 ug | ORAL_TABLET | Freq: Every day | ORAL | 3 refills | Status: DC

## 2017-08-10 MED ORDER — INSULIN GLARGINE 100 UNIT/ML SOLOSTAR PEN: 20.0000 [IU] | PEN_INJECTOR | Freq: Every day | SUBCUTANEOUS | 3 refills | Status: DC

## 2017-08-10 NOTE — Progress Notes (Signed)
BP 120/78 (BP Location: Left Arm, Patient Position: Sitting, Cuff Size: Normal)   [name] 75   Temp 98.3 F (36.8 C) (Oral)   Ht 5' 2.75" (1.594 m)   Wt 140 lb 4 oz (63.6 kg)   SpO2 99%   BMI 25.04 kg/m    CC: CPE Subjective:    Patient ID: [name], female    DOB: 10/12/1934, 83 y.o.   MRN: [id number]  HPI: [name] is a 83 y.o. female presenting on 08/10/2017 for Annual Exam (Pt accompanied by daughter, [name].)   Here with daughter [name]. Last seen here 08/2016. Lives 6 months locally with daughter [name] [facility]), 6 months in [place] with other daughter. Needs all meds refilled.    Chronic knee osteoarthritis s/p synvisc injections 10/2015 and again 05/2016 with benefit. Walks with cane. Upcoming appt with Dr Patsy Lager later this week.   DM - compliant with lantus 8u daily, metformin 500mg  daily. Checks regularly. Fasting this morning 140.   Ongoing constipation. Prune pills don't help. Good water intake.   10 lb weight loss over last year. Following Dr Fransisco Beau "say goodbye to diabetes". Has made healthy diet changes.   Preventative: Colon cancer screening - s/p colonoscopy 2013 WNL per patient in [place] Lung cancer screening - non smoker Breast cancer screening - mammo 2012 WNL. Declines rpt. Doesn't check breast exams.  Well woman exam - s/p hysterectomy with BSO. Underwent premarin treatment after menopause.  DEXA scan - 2013 normal per patient Flu shot - declines due to bad reaction in the past  Tetanus shot - thinks UTD Prevnar 01/2016. Pneumovax - declines.  Shingles shot - declines  Advanced directive discussion - does not have living will. Discussed. Packet provided today (spanish). Does not want dialysis. Ok with temporary life support, does not want prolonged life support.  Non-smoker - daughter smokes outside  Alcohol - rare Dentist - has dentures Eye exam - due  Lives with daughter Jonell Cluck  Originally from [place], moved  2013 G5P5 Activity: stationary bicycle 30 min /day Diet: good water, fruits/vegetables daily  Relevant past medical, surgical, family and social history reviewed and updated as indicated. Interim medical history since our last visit reviewed. Allergies and medications reviewed and updated. Outpatient Medications Prior to Visit  Medication Sig Dispense Refill  . Bioflavonoid Products (BIOFLEX) TABS Take 1 tablet by mouth daily.    . Sennosides 15 MG TABS Take 1 tablet by mouth every other day.    Marland Kitchen atenolol (TENORMIN) 50 MG tablet TAKE ONE TABLET BY MOUTH ONE TIME DAILY 90 tablet 1  . Insulin Glargine (LANTUS SOLOSTAR) 100 UNIT/ML Solostar Pen Inject 20-25 Units into the skin daily. 24 mL 2  . levothyroxine (SYNTHROID, LEVOTHROID) 75 MCG tablet TAKE ONE TABLET BY MOUTH ONCE DAILY BEFORE BREAKFAST 90 tablet 2  . metFORMIN (GLUCOPHAGE) 500 MG tablet TAKE ONE TABLET BY MOUTH ONE TIME DAILY WITH BREAKFAST 90 tablet 1  . rivaroxaban (XARELTO) 20 MG TABS tablet Take 1 tablet (20 mg total) by mouth daily with supper. 90 tablet 2  . traZODone (DESYREL) 50 MG tablet Take 1 tablet (50 mg total) by mouth at bedtime as needed for sleep. 90 tablet 2   No facility-administered medications prior to visit.      Per HPI unless specifically indicated in ROS section below Review of Systems  Constitutional: Negative for activity change, appetite change, chills, fatigue, fever and unexpected weight change.  HENT: Negative for hearing loss.   Eyes: Negative for visual  disturbance.  Respiratory: Negative for cough, chest tightness, shortness of breath and wheezing.   Cardiovascular: Negative for chest pain, palpitations and leg swelling.  Gastrointestinal: Positive for constipation. Negative for abdominal distention, abdominal pain, blood in stool, diarrhea, nausea and vomiting.  Genitourinary: Negative for difficulty urinating and hematuria.  Musculoskeletal: Negative for arthralgias, myalgias and neck pain.   Skin: Negative for rash.  Neurological: Positive for headaches. Negative for dizziness, seizures and syncope.  Hematological: Negative for adenopathy. Bruises/bleeds easily.  Psychiatric/Behavioral: Negative for dysphoric mood. The patient is not nervous/anxious.        Objective:    BP 120/78 (BP Location: Left Arm, Patient Position: Sitting, Cuff Size: Normal)   [name] 75   Temp 98.3 F (36.8 C) (Oral)   Ht 5' 2.75" (1.594 m)   Wt 140 lb 4 oz (63.6 kg)   SpO2 99%   BMI 25.04 kg/m   Wt Readings from Last 3 Encounters:  08/10/17 140 lb 4 oz (63.6 kg)  09/29/16 145 lb 4 oz (65.9 kg)  05/12/16 150 lb (68 kg)    Physical Exam  Constitutional: She is oriented to person, place, and time. She appears well-developed and well-nourished. No distress.  HENT:  Head: Normocephalic and atraumatic.  Right Ear: Hearing, tympanic membrane, external ear and ear canal normal.  Left Ear: Hearing, tympanic membrane, external ear and ear canal normal.  Nose: Nose normal.  Mouth/Throat: Uvula is midline, oropharynx is clear and moist and mucous membranes are normal. No oropharyngeal exudate, posterior oropharyngeal edema or posterior oropharyngeal erythema.  Eyes: Pupils are equal, round, and reactive to light. Conjunctivae and EOM are normal. No scleral icterus.  Neck: Normal range of motion. Neck supple. No thyromegaly present.  Cardiovascular: Normal rate, regular rhythm, normal heart sounds and intact distal pulses.  No murmur heard. Pulses:      Radial pulses are 2+ on the right side, and 2+ on the left side.  Pulmonary/Chest: Effort normal and breath sounds normal. No respiratory distress. She has no wheezes. She has no rales.  Abdominal: Soft. Bowel sounds are normal. She exhibits no distension and no mass. There is no tenderness. There is no rebound and no guarding.  Musculoskeletal: Normal range of motion. She exhibits no edema.  Lymphadenopathy:    She has no cervical adenopathy.   Neurological: She is alert and oriented to person, place, and time.  CN grossly intact, station and gait intact  Skin: Skin is warm and dry. No rash noted.  Psychiatric: She has a normal mood and affect. Her behavior is normal. Judgment and thought content normal.  Nursing note and vitals reviewed.  Results for orders placed or performed in visit on 09/29/16  TSH  Result Value Ref Range   TSH 1.65 0.35 - 4.50 uIU/mL  Hemoglobin A1c  Result Value Ref Range   Hgb A1c MFr Bld 7.2 (H) 4.6 - 6.5 %  Basic metabolic panel  Result Value Ref Range   Sodium 138 135 - 145 mEq/L   Potassium 4.2 3.5 - 5.1 mEq/L   Chloride 104 96 - 112 mEq/L   CO2 28 19 - 32 mEq/L   Glucose, Bld 107 (H) 70 - 99 mg/dL   BUN 18 6 - 23 mg/dL   Creatinine, Ser 5.400.82 0.40 - 1.20 mg/dL   Calcium 9.0 8.4 - 98.110.5 mg/dL   GFR 19.1471.31 >78.29>60.00 mL/min  HM DIABETES EYE EXAM  Result Value Ref Range   HM Diabetic Eye Exam No Retinopathy No Retinopathy  EKG - atrial fibrillation 90s, diffuse ST flattening, low voltage    Assessment & Plan:   Problem List Items Addressed This Visit    Weight loss    Through healthy diet changes. Congratulated, don't recommend further weight loss       Relevant Orders   CBC with Differential/Platelet   Type 2 diabetes mellitus with complication, with long-term current use of insulin (HCC)    Chronic, seems well controlled since healthy diet changes - following Dr Rebekah Chesterfield low carb diet. Congratulated on weight loss to date.       Relevant Medications   Insulin Glargine (LANTUS SOLOSTAR) 100 UNIT/ML Solostar Pen   metFORMIN (GLUCOPHAGE) 500 MG tablet   Other Relevant Orders   Ambulatory referral to Ophthalmology   Hemoglobin A1c   Microalbumin / creatinine urine ratio   Primary osteoarthritis of both knees    S/p synvisc injections x2, latest 2018. Planning to see Dr Patsy Lager later this week.  Weight loss has helped knee osteoarthritis.       Osteopenia    Per patient, normal  DEXA in [place]. No records available.       Insomnia    Ongoing, trazodone has not been helping. Continue for now.       Hypothyroidism    Update TSH. Pt unsure if she's taking - thinks she stopped several weeks ago. Encouraged restart this.       Relevant Medications   atenolol (TENORMIN) 50 MG tablet   levothyroxine (SYNTHROID, LEVOTHROID) 75 MCG tablet   Other Relevant Orders   TSH   T4, free   History of ischemic stroke without residual deficits    On xarelto - consider addition of aspirin 81mg  daily. Check FLP, consider addition of statin.       Health maintenance examination - Primary    Preventative protocols reviewed and updated unless pt declined. Discussed healthy diet and lifestyle.  Significant weight loss with healthy changes. Agrees to pneumovax. Declines mammogram/breast exam, declines other vaccinations.       Dyslipidemia    Off statin. Previously not interested in restarting.  Will check FLP      Relevant Orders   Lipid panel   Comprehensive metabolic panel   Atrial fibrillation (HCC)    Continue xarelto. H/o CVA 2014 Sounds irregularly irregular today - update EKG      Relevant Medications   atenolol (TENORMIN) 50 MG tablet   rivaroxaban (XARELTO) 20 MG TABS tablet   Other Relevant Orders   EKG 12-Lead (Completed)   Advanced care planning/counseling discussion    Advanced directive discussion - does not have living will. Discussed. Packet provided today (spanish). Does not want dialysis. Ok with temporary life support, does not want prolonged life support.        Other Visit Diagnoses    Need for 23-polyvalent pneumococcal polysaccharide vaccine       Relevant Orders   Pneumococcal polysaccharide vaccine 23-valent greater than or equal to 2yo subcutaneous/IM (Completed)       Meds ordered this encounter  Medications  . atenolol (TENORMIN) 50 MG tablet    Sig: Take 1 tablet (50 mg total) by mouth daily.    Dispense:  90 tablet     Refill:  3  . Insulin Glargine (LANTUS SOLOSTAR) 100 UNIT/ML Solostar Pen    Sig: Inject 20-25 Units into the skin daily.    Dispense:  15 mL    Refill:  3  . levothyroxine (SYNTHROID, LEVOTHROID) 75 MCG tablet  Sig: Take 1 tablet (75 mcg total) by mouth daily before breakfast.    Dispense:  90 tablet    Refill:  3  . metFORMIN (GLUCOPHAGE) 500 MG tablet    Sig: Take 1 tablet (500 mg total) by mouth daily with breakfast.    Dispense:  90 tablet    Refill:  3  . rivaroxaban (XARELTO) 20 MG TABS tablet    Sig: Take 1 tablet (20 mg total) by mouth daily with supper.    Dispense:  90 tablet    Refill:  3  . traZODone (DESYREL) 50 MG tablet    Sig: Take 1 tablet (50 mg total) by mouth at bedtime as needed for sleep.    Dispense:  90 tablet    Refill:  2   Orders Placed This Encounter  Procedures  . Pneumococcal polysaccharide vaccine 23-valent greater than or equal to 2yo subcutaneous/IM  . Lipid panel  . Comprehensive metabolic panel  . TSH  . Hemoglobin A1c  . CBC with Differential/Platelet  . T4, free  . Microalbumin / creatinine urine ratio  . Ambulatory referral to Ophthalmology    Referral Priority:   Routine    Referral Type:   Consultation    Referral Reason:   Specialty Services Required    Requested Specialty:   Ophthalmology    Number of Visits Requested:   1  . EKG 12-Lead    Follow up plan: Return in about 6 months (around 02/09/2018) for follow up visit.  Eustaquio Boyden, MD

## 2017-08-10 NOTE — Assessment & Plan Note (Addendum)
S/p synvisc injections x2, latest 2018. Planning to see Dr Patsy Lageropland later this week.  Weight loss has helped knee osteoarthritis.

## 2017-08-10 NOTE — Assessment & Plan Note (Signed)
Advanced directive discussion - does not have living will. Discussed. Packet provided today (spanish). Does not want dialysis. Ok with temporary life support, does not want prolonged life support.

## 2017-08-10 NOTE — Assessment & Plan Note (Signed)
Ongoing, trazodone has not been helping. Continue for now.

## 2017-08-10 NOTE — Assessment & Plan Note (Signed)
On xarelto - consider addition of aspirin 81mg  daily. Check FLP, consider addition of statin.

## 2017-08-10 NOTE — Assessment & Plan Note (Signed)
Through healthy diet changes. Congratulated, don't recommend further weight loss

## 2017-08-10 NOTE — Assessment & Plan Note (Signed)
Update TSH. Pt unsure if she's taking - thinks she stopped several weeks ago. Encouraged restart this.

## 2017-08-10 NOTE — Assessment & Plan Note (Signed)
Off statin. Previously not interested in restarting.  Will check FLP

## 2017-08-10 NOTE — Assessment & Plan Note (Signed)
Per patient, normal DEXA in [place]. No records available.

## 2017-08-10 NOTE — Assessment & Plan Note (Addendum)
Chronic, seems well controlled since healthy diet changes - following Dr Rebekah ChesterfieldFuhrman's low carb diet. Congratulated on weight loss to date.

## 2017-08-10 NOTE — Assessment & Plan Note (Addendum)
Continue xarelto. H/o CVA 2014 Sounds irregularly irregular today - update EKG

## 2017-08-10 NOTE — Assessment & Plan Note (Addendum)
Preventative protocols reviewed and updated unless pt declined. Discussed healthy diet and lifestyle.  Significant weight loss with healthy changes. Agrees to pneumovax. Declines mammogram/breast exam, declines other vaccinations.

## 2017-08-10 NOTE — Patient Instructions (Addendum)
Pneumovax today EKG today Si le interesa la nueva vacuna contra herpes zoster (shingrix), revise con su farmacia si esta disponible.  Wynonia Hazard remision a doctor de ojos.  Advanced directive packet provided today.  [place] (Health Maintenance, Female) Un estilo de vida saludable y los cuidados preventivos pueden favorecer considerablemente a la salud y Musician. Pregunte a su mdico cul es el cronograma de exmenes peridicos apropiado para usted. Esta es una buena oportunidad para consultarlo sobre cmo prevenir enfermedades y Klahr sano. Adems de los controles, hay muchas otras cosas que puede hacer usted mismo. Los expertos han realizado numerosas investigaciones [employer]vin[employer]itor cambios en el estilo de vida y las medidas de prevencin que, [place], lo ayudarn a mantenerse sano. Solicite a su mdico ms informacin. EL PESO Y LA DIETA Consuma una dieta saludable.  Asegrese de [employer] verduras, frutas, productos lcteos de bajo contenido de [place] y Advertising account planner.  No consuma muchos alimentos de alto contenido de grasas slidas, azcares agregados o sal.  Realice actividad fsica con regularidad. Esta es una de las prcticas ms importantes que puede hacer por su salud. ? La Delorise Shiner de los adultos deben hacer ejercicio durante al menos 167mnutos por semana. El ejercicio debe aumentar la frecuencia cardaca y pActorla transpiracin (ejercicio de [place]. ? La mayora de los adultos tambin deben hacer ejercicios de elongacin al mToysRusveces a la semana. Agregue esto al su plan de ejercicio de intensidad moderada. Mantenga un peso saludable.  El ndice de masa corporal ([facility] es una medida que puede utilizarse para identificar posibles problemas de pAllenwood Proporciona una estimacin de la grasa corporal basndose en el peso y la altura. Su mdico puede ayudarle a dRadiation protection practitionerIIndiantowny a lScientist, forensico mTheatre managerun peso saludable.  Para  las mujeres de 20aos o ms: ? Un ISt Luke'S Hospitalmenor de 18,5 se considera bajo peso. ? Un IEmanuel [facility] Center, Incentre 18,5 y 24,9 es normal. ? Un ISmokey Point Behaivoral Hospitalentre 25 y 29,9 se considera sobrepeso. ? Un IMC de 30 o ms se considera obesidad. Observe los niveles de colesterol y lpidos en la sangre.  Debe comenzar a rEnglish as a second language teacherde lpidos y cResearch officer, trade unionen la sangre a los 20aos y luego repetirlos cada 548aos  Es posible que nAutomotive engineerlos niveles de colesterol con mayor frecuencia si: ? Sus niveles de lpidos y colesterol son altos. ? Es mayor de 586PYP ? Presenta un alto riesgo de padecer enfermedades cardacas. DETECCIN DE CNCER Cncer de pulmn  Se recomienda realizar exmenes de deteccin de cncer de pulmn a personas adultas entre 530y 849aos que estn en riesgo de dHorticulturist, commercialde pulmn por sus antecedentes de consumo de tabaco.  Se recomienda una tomografa computarizada de baja dosis de los pulmones todos los aos a las personas que: ? Fuman actualmente. ? Hayan dejado el hbito en algn momento en los ltimos 15aos. ? Hayan fumado durante 30aos un paquete diario. Un paquete-ao equivale a fumar un promedio de un paquete de cigarrillos diario durante un ao.  Los exmenes de deteccin anuales deben continuar hasta que hayan pasado 15aos desde que dej de fumar.  Ya no debern realizarse si tiene un problema de salud que le impida recibir tratamiento para eScience writerde pulmn. Cncer de mama  Practique la autoconciencia de la mama. Esto significa reconocer la apariencia normal de sus mamas y cmo las siente.  Tambin significa realizar autoexmenes regulares de lJohnson & Johnson Informe a su mdico sobre  cualquier cambio, sin importar cun pequeo sea.  Si tiene entre 20 y 23 aos, un mdico debe realizarle un examen clnico de las mamas como parte del examen regular de [place], cada 1 a 3aos.  Si tiene 40aos o ms, debe Information systems manager clnico de las Microsoft.  Tambin considere realizarse una Datil ([place]) todos los [place].  Si tiene antecedentes familiares de cncer de mama, hable con su mdico para someterse a un estudio gentico.  Si tiene alto riesgo de Chief Financial Officer de mama, hable con su mdico para someterse a Public house manager y [employer].  La evaluacin del gen del cncer de mama (BRCA) se recomienda a mujeres que tengan familiares con cnceres relacionados con el BRCA. Los cnceres relacionados con el BRCA incluyen los siguientes: ? [place]. ? Ovario. ? Trompas. ? Cnceres de peritoneo.  Los resultados de la evaluacin determinarn la necesidad de asesoramiento gentico y de [place] de BRCA1 y BRCA2. Cncer de cuello del tero El mdico puede recomendarle que se haga pruebas peridicas de deteccin de cncer de los rganos de la pelvis (ovarios, tero y vagina). Estas pruebas incluyen un examen plvico, que abarca controlar si se produjeron cambios microscpicos en la superficie del cuello del tero (prueba de Papanicolaou). Pueden recomendarle que se haga estas pruebas cada 3aos, a partir de los 21aos.  A las mujeres que tienen entre 30 y 39aos, los mdicos pueden recomendarles que se sometan a exmenes plvicos y pruebas de Papanicolaou cada 11aos, o a la prueba de Papanicolaou y el examen plvico en combinacin con estudios de deteccin del virus del papiloma humano (VPH) cada 5aos. Algunos tipos de VPH aumentan el riesgo de Chief Financial Officer de cuello del tero. La prueba para la deteccin del VPH tambin puede realizarse a mujeres de cualquier edad cuyos resultados de la prueba de Papanicolaou no sean claros.  Es posible que otros mdicos no recomienden exmenes de deteccin a mujeres no embarazadas que se consideran sujetos de bajo riesgo de Chief Financial Officer de pelvis y que no tienen sntomas. Pregntele al mdico si un examen plvico de deteccin es adecuado para usted.  Si ha recibido  un tratamiento para Science writer cervical o una enfermedad que podra causar cncer, necesitar realizarse una prueba de Papanicolaou y controles durante al menos 39 aos de concluido el [place]. Si no se ha hecho el Papanicolaou con regularidad, debern volver a evaluarse los factores de riesgo (como tener un nuevo compaero sexual), para Teacher, adult education si debe realizarse los estudios nuevamente. Algunas mujeres sufren problemas mdicos que aumentan la probabilidad de Museum/gallery curator cncer de cuello del tero. En estos casos, el mdico podr [employer]COMM se realicen controles y pruebas de Papanicolaou con ms frecuencia. Cncer colorrectal  Este tipo de cncer puede detectarse y a menudo prevenirse.  Por lo general, los estudios de rutina se deben Medical laboratory scientific officer a Field seismologist a Proofreader de los 18 aos y Eschbach 74 aos.  Sin embargo, el mdico podr aconsejarle que lo haga antes, si tiene factores de riesgo para el cncer de colon.  Tambin puede recomendarle que use un kit de prueba para Hydrologist en la materia fecal.  Es posible que se use una pequea cmara en el extremo de un tubo para examinar directamente el colon (sigmoidoscopia o colonoscopia) a fin de Hydrographic surveyor formas tempranas de cncer colorrectal.  Los exmenes de rutina generalmente comienzan a los 31aos.  El examen directo del colon se debe repetir cada  5 a 10aos hasta los 75aos. Sin embargo, es posible que se realicen exmenes con mayor frecuencia, si se detectan formas tempranas de plipos precancerosos o pequeos bultos. Cncer de piel  Revise la piel de la cabeza a los pies con regularidad.  Informe a su mdico si aparecen nuevos lunares o los que tiene se modifican, especialmente en su forma y color.  Tambin notifique al mdico si tiene un lunar que es ms grande que el tamao de una goma de lpiz.  Siempre use pantalla solar. Aplique pantalla solar de Kerry Dory y repetida a lo largo del Training and development officer.  Protjase usando mangas y  [employer], un sombrero de ala ancha y gafas para el sol, siempre que se encuentre en el exterior. ENFERMEDADES CARDACAS, DIABETES E HIPERTENSIN ARTERIAL  La hipertensin arterial causa enfermedades cardacas y [place] el riesgo de ictus. La hipertensin arterial es ms probable en los siguientes casos: ? Las personas que tienen la presin arterial en el extremo del rango normal (100-139/85-89 mm Hg). ? [employer] con sobrepeso u obesidad. ? Scientist, water quality.  Si usted tiene entre 18 y 39 aos, debe medirse la presin arterial cada 3 a 5 aos. Si usted tiene 40 aos o ms, debe medirse la presin arterial [employer]lett[employer]. Debe medirse la presin arterial dos veces: una vez cuando est en un hospital o una clnica y la otra vez cuando est en otro sitio. Registre el promedio de [employer]. Para controlar su presin arterial cuando no est en un hospital o [place], puede usar lo siguiente: ? Jorje Guild automtica para medir la presin arterial en una farmacia. ? Un monitor para medir la presin arterial en el hogar.  Si tiene entre 40 y 77 aos, consulte a su mdico si debe tomar aspirina para prevenir el ictus.  Realcese exmenes de deteccin de la diabetes con regularidad. Esto incluye la toma de [place] de sangre para controlar el nivel de azcar en la sangre durante el [place]. ? Si tiene un peso normal y un bajo riesgo de padecer diabetes, realcese este anlisis cada tres aos despus de los 45aos. ? Si tiene sobrepeso y un alto riesgo de padecer diabetes, considere someterse a este anlisis antes o con mayor frecuencia. PREVENCIN DE INFECCIONES HepatitisB  Si tiene un riesgo ms alto de Museum/gallery curator hepatitis B, debe someterse a un examen de deteccin de este virus. Se considera que tiene un alto riesgo de contraer hepatitis B si: ? Naci en un pas donde la hepatitis B es frecuente. Pregntele a su mdico qu pases son considerados de Recruitment consultant. ? Sus padres nacieron en un pas de alto riesgo y usted no recibi una vacuna que lo proteja contra la hepatitis B (vacuna contra la hepatitis B). ? [place]. ? [place] agujas para inyectarse drogas. ? Vive con alguien que tiene hepatitis B. ? Ha tenido sexo con alguien que tiene hepatitis B. ? Recibe tratamiento de hemodilisis. ? Toma ciertos medicamentos para el cncer, trasplante de rganos y afecciones autoinmunitarias. Hepatitis C  Se recomienda un anlisis de [place] para: ? [employer] 1945 y 1965. ? Todas las personas que tengan un riesgo de haber contrado hepatitis C. Enfermedades de transmisin sexual (ETS).  Debe realizarse pruebas de deteccin de enfermedades de transmisin sexual (ETS), incluidas gonorrea y clamidia si: ? Es sexualmente activo y es menor de 22QMG. ? Es mayor de 24aos, y Investment banker, operational informa que corre riesgo de  tener este tipo de infecciones. ? La actividad sexual ha cambiado desde que le hicieron la ltima prueba de deteccin y tiene un riesgo mayor de Best boy clamidia o Radio broadcast assistant. Pregntele al mdico si usted tiene riesgo.  Si no tiene el VIH, pero corre riesgo de infectarse por el virus, se recomienda tomar diariamente un medicamento recetado para evitar la infeccin. Esto se conoce como profilaxis previa a la exposicin. Se considera que est en riesgo si: ? Es [place] sexualmente y no [place] preservativos habitualmente o no conoce el estado del VIH de sus Advertising copywriter. ? Se inyecta drogas. ? Es [place] sexualmente con Ardelia Mems pareja que tiene VIH. Consulte a su mdico para saber si tiene un alto riesgo de infectarse por el VIH. Si opta por comenzar la profilaxis previa a la exposicin, primero debe realizarse anlisis de deteccin del VIH. Luego, le harn anlisis cada 1mses mientras est tomando los medicamentos para la profilaxis previa a la exposicin. [facility] Si es premenopusica y puede quedar eCorte [place]era solicite a su  mdico asesoramiento previo a la concepcin.  Si puede quedar embarazada, tome 400 a [id number](mcg) de cido fAnheuser-Busch  Si desea evitar el embarazo, hable con su mdico sobre el control de la natalidad (anticoncepcin). OSTEOPOROSIS Y MENOPAUSIA  La osteoporosis es una enfermedad en la que los huesos pierden los minerales y la fuerza por el avance de la edad. El resultado pueden ser fracturas graves en los h[place] El riesgo de osteoporosis puede identificarse con uArdelia Memsprueba de densidad sea.  Si tiene 65aos o ms, o si est en riesgo de sufrir osteoporosis y fracturas, pregunte a su mdico si debe someterse a exmenes.  Consulte a su mdico si debe tomar un suplemento de calcio o de vitamina D para reducir el riesgo de osteoporosis.  La menopausia puede presentar ciertos sntomas fsicos y rGaffer  La terapia de reemplazo hormonal puede reducir algunos de estos sntomas y rGaffer Consulte a su mdico para saber si la terapia de reemplazo hormonal es conveniente para usted. INSTRUCCIONES PARA EL CUIDADO EN EL HOGAR  Realcese los estudios de rutina de la salud, dentales y de lPublic librarian  MDallas  No consuma ningn producto que contenga tabaco, lo que incluye cigarrillos, tabaco de mHigher education careers advisero cPsychologist, sport and exercise  Si est embarazada, no beba alcohol.  Si est amamantando, reduzca el consumo de alcohol y la frecuencia con la que consume.  Si es mujer y no est embarazada limite el consumo de alcohol a no ms de 1 medida por da. Una medida equivale a 12onzas de cerveza, 5onzas de vino o 1onzas de bebidas alcohlicas de alta graduacin.  No consuma drogas.  No comparta agujas.  Solicite ayuda a su mdico si necesita apoyo o informacin para abandonar las drogas.  Informe a su mdico si a menudo se siente deprimido.  Notifique a su mdico si alguna vez ha sido vctima de abuso o si no se siente seguro en su hogar. Esta  informacin no tiene cMarine scientistel consejo del mdico. Asegrese de hacerle al mdico cualquier pregunta que tenga. Document Released: 02/06/2011 Document Revised: 03/10/2014 Document Reviewed: 11/21/2014 Elsevier Interactive Patient Education  2[employer]

## 2017-08-11 LAB — CBC WITH DIFFERENTIAL/PLATELET
Basophils Absolute: 0.1 10*3/uL (ref 0.0–0.1)
Basophils Relative: 0.8 % (ref 0.0–3.0)
Eosinophils Absolute: 0.1 10*3/uL (ref 0.0–0.7)
Eosinophils Relative: 1.8 % (ref 0.0–5.0)
HCT: 31.5 % — ABNORMAL LOW (ref 36.0–46.0)
Hemoglobin: 10.3 g/dL — ABNORMAL LOW (ref 12.0–15.0)
LYMPHS PCT: 24.4 % (ref 12.0–46.0)
Lymphs Abs: 2 10*3/uL (ref 0.7–4.0)
MCHC: 32.8 g/dL (ref 30.0–36.0)
MCV: 80.2 fl (ref 78.0–100.0)
Monocytes Absolute: 0.6 10*3/uL (ref 0.1–1.0)
Monocytes Relative: 6.9 % (ref 3.0–12.0)
NEUTROS PCT: 66.1 % (ref 43.0–77.0)
Neutro Abs: 5.4 10*3/uL (ref 1.4–7.7)
PLATELETS: 195 10*3/uL (ref 150.0–400.0)
RBC: 3.93 Mil/uL (ref 3.87–5.11)
RDW: 14.2 % (ref 11.5–15.5)
WBC: 8.2 10*3/uL (ref 4.0–10.5)

## 2017-08-11 LAB — COMPREHENSIVE METABOLIC PANEL
ALT: 10 U/L (ref 0–35)
AST: 16 U/L (ref 0–37)
Albumin: 4.1 g/dL (ref 3.5–5.2)
Alkaline Phosphatase: 46 U/L (ref 39–117)
BUN: 14 mg/dL (ref 6–23)
CO2: 28 mEq/L (ref 19–32)
Calcium: 9.1 mg/dL (ref 8.4–10.5)
Chloride: 98 mEq/L (ref 96–112)
Creatinine, Ser: 0.83 mg/dL (ref 0.40–1.20)
GFR: 70.17 mL/min (ref >60.00)
Glucose, Bld: 92 mg/dL (ref 70–99)
Potassium: 4.5 mEq/L (ref 3.5–5.1)
Sodium: 132 mEq/L — ABNORMAL LOW (ref 135–145)
Total Bilirubin: 0.3 mg/dL (ref 0.2–1.2)
Total Protein: 7.1 g/dL (ref 6.0–8.3)

## 2017-08-11 LAB — LIPID PANEL
CHOLESTEROL: 127 mg/dL (ref 0–200)
HDL: 33.2 mg/dL — ABNORMAL LOW (ref >39.00)
LDL Cholesterol: 73 mg/dL (ref 0–99)
NONHDL: 93.72
Total CHOL/HDL Ratio: 4
Triglycerides: 104 mg/dL (ref 0.0–149.0)
VLDL: 20.8 mg/dL (ref 0.0–40.0)

## 2017-08-11 LAB — MICROALBUMIN / CREATININE URINE RATIO
CREATININE, U: 72.7 mg/dL
MICROALB/CREAT RATIO: 1 mg/g (ref 0.0–30.0)

## 2017-08-11 LAB — T4, FREE: FREE T4: 0.85 ng/dL (ref 0.60–1.60)

## 2017-08-11 LAB — TSH: TSH: 2.85 u[IU]/mL (ref 0.35–4.50)

## 2017-08-11 LAB — HEMOGLOBIN A1C: Hgb A1c MFr Bld: 8.5 % — ABNORMAL HIGH (ref 4.6–6.5)

## 2017-08-12 ENCOUNTER — Encounter: Payer: Self-pay | Admitting: Family Medicine

## 2017-08-12 ENCOUNTER — Other Ambulatory Visit: Payer: Self-pay | Admitting: Family Medicine

## 2017-08-12 ENCOUNTER — Ambulatory Visit: Payer: BLUE CROSS/BLUE SHIELD | Admitting: Family Medicine

## 2017-08-12 VITALS — BP 110/70 | HR 81 | Temp 98.5°F | Ht 62.75 in | Wt 136.8 lb

## 2017-08-12 DIAGNOSIS — M170 Bilateral primary osteoarthritis of knee: Secondary | ICD-10-CM | POA: Diagnosis not present

## 2017-08-12 DIAGNOSIS — D500 Iron deficiency anemia secondary to blood loss (chronic): Secondary | ICD-10-CM

## 2017-08-12 DIAGNOSIS — D649 Anemia, unspecified: Secondary | ICD-10-CM | POA: Insufficient documentation

## 2017-08-12 MED ORDER — HYLAN G-F 20 48 MG/6ML IX SOSY
48.0000 mg | PREFILLED_SYRINGE | Freq: Once | INTRA_ARTICULAR | Status: AC
  Administered 2017-08-12: 48 mg via INTRA_ARTICULAR

## 2017-08-12 MED ORDER — HYLAN G-F 20 48 MG/6ML IX SOSY
48.0000 mg | PREFILLED_SYRINGE | Freq: Once | INTRA_ARTICULAR | Status: AC
Start: 2017-08-12 — End: 2017-08-12
  Administered 2017-08-12: 48 mg via INTRA_ARTICULAR

## 2017-08-12 NOTE — Progress Notes (Signed)
   Dr. Karleen HampshireSpencer T. Jahdai Padovano, MD, CAQ Sports Medicine Primary Care and Sports Medicine [address] [place], [id number] Phone: [phone] Fax: [phone]  08/12/2017  Patient: Deanna PulseLucila Fratus, MRN: [id number], DOB: 09/21/1934, 83 y.o.  Primary Physician:  Eustaquio BoydenGutierrez, Javier, MD   Chief Complaint  Patient presents with  . Knee Pain    B synvisc injections  Great previous responses.   Knee Injection: Synvisc-One, R Patient verbally consented to procedure. Risks (including infection), benefits, and alternatives explained. Sterilely prepped with Chloraprep. Ethyl cholride used for anesthesia, then 7 cc of Lidocaine 1% used for anesthesia in the anterolateral position. Reprepped with Chloraprep.  Anterolateral approach used to inject joint without difficulty, injected with Synvisc-One, 6 mL. No complications with procedure and tolerated well.   Knee Injection: Synvisc-One, L Patient verbally consented to procedure. Risks (including infection), benefits, and alternatives explained. Sterilely prepped with Chloraprep. Ethyl cholride used for anesthesia, then 7 cc of Lidocaine 1% used for anesthesia in the anterolateral position. Reprepped with Chloraprep.  Anterolateral approach used to inject joint without difficulty, injected with Synvisc-One, 6 mL. No complications with procedure and tolerated well.   Signed,  Elpidio GaleaSpencer T. Jadon Ressler, MD

## 2017-08-13 ENCOUNTER — Encounter: Payer: Self-pay | Admitting: Family Medicine

## 2017-08-18 ENCOUNTER — Other Ambulatory Visit (INDEPENDENT_AMBULATORY_CARE_PROVIDER_SITE_OTHER): Payer: BLUE CROSS/BLUE SHIELD

## 2017-08-18 DIAGNOSIS — D500 Iron deficiency anemia secondary to blood loss (chronic): Secondary | ICD-10-CM

## 2017-08-18 LAB — FECAL OCCULT BLOOD, IMMUNOCHEMICAL: FECAL OCCULT BLD: NEGATIVE

## 2018-05-12 ENCOUNTER — Other Ambulatory Visit: Payer: Self-pay | Admitting: Family Medicine

## 2018-06-17 ENCOUNTER — Telehealth: Payer: Self-pay | Admitting: Radiology

## 2018-06-17 NOTE — Telephone Encounter (Signed)
LM for Ms Dettorre and her daughter to call and schedule a lab appt then a doxy.me appt with Dr Sharen Hones

## 2018-08-05 ENCOUNTER — Other Ambulatory Visit: Payer: Self-pay | Admitting: Family Medicine

## 2018-08-05 NOTE — Telephone Encounter (Signed)
Terri called patient and daughter in April and left a message to schedule lab appointment and then Doxy me with Dr. Reece Agar but patient did not call back. Sending mychart to patient also. For Dr. Reece Agar to review on how to proceed with this refill request. Thank you

## 2018-08-08 ENCOUNTER — Other Ambulatory Visit: Payer: Self-pay | Admitting: Family Medicine

## 2018-08-11 ENCOUNTER — Other Ambulatory Visit: Payer: Self-pay | Admitting: Family Medicine

## 2018-08-12 NOTE — Telephone Encounter (Signed)
Refilled. Asked to schedule f/u visit

## 2018-08-12 NOTE — Telephone Encounter (Signed)
Xarelto Last filled:  06/11/18, #90 Last OV:  08/12/17, acute with Dr. Lorelei Pont Next OV:  none

## 2018-08-17 NOTE — Telephone Encounter (Signed)
Noted  

## 2018-08-17 NOTE — Telephone Encounter (Signed)
Pt scheduled for 10/19/18 as of now her insurance is out of network. There are going to try to get her on medicare b/c the cost is too high but they don't want to lose Dr. Darnell Level as a provider. She is currently in [place] with her brother. She should be back in August but they might want her to get labs soon. She will send a myChart message if they do want to get labs.

## 2018-08-27 NOTE — Telephone Encounter (Signed)
Dalphine Handing (daughter) wanted to schedule pt VIRTUAL CPX asap  Can pt be worked in   [phone]  Vicki Mallet (son)  [name] will send information on lab in [place]

## 2018-08-30 NOTE — Telephone Encounter (Signed)
Do you want virtual CPE now or wait until pt returns in Aug 2020 when she can have labs done too?

## 2018-08-30 NOTE — Telephone Encounter (Signed)
Ok to schedule physical in July - will need info on how to get labs in [place]. Would also verify her insurance to see if it will cover virtual physical.

## 2018-09-01 ENCOUNTER — Encounter: Payer: Self-pay | Admitting: Family Medicine

## 2018-09-01 NOTE — Telephone Encounter (Signed)
Lab name accu path dx lab [address] [place], [place]  Phone [phone] Appointment  09/14/2018

## 2018-09-01 NOTE — Telephone Encounter (Signed)
Left message asking Vicki Mallet  to call office

## 2018-09-01 NOTE — Telephone Encounter (Signed)
Spoke with pt's daughter, [name] (on dpr), informing her pt's 7/14 appt is a virtual visit.  She verbalizes understanding and says she and Shirlean Mylar have spoken since this MyChart message was sent.

## 2018-09-01 NOTE — Telephone Encounter (Signed)
Noted.  Fyi to Dr. G.  

## 2018-09-06 ENCOUNTER — Encounter: Payer: Self-pay | Admitting: Family Medicine

## 2018-09-06 NOTE — Telephone Encounter (Signed)
Left message on vm per dpr asking pt's daughter, [name], to call back with a fax number for the lab in [place], [place] so we can fax the labs Dr. Darnell Level wants done.

## 2018-09-06 NOTE — Telephone Encounter (Signed)
Lab slip written and placed in Lisa's box.  plz fax to practice below.

## 2018-09-07 NOTE — Telephone Encounter (Signed)
Faxed lab order to [phone];  phn #  [phone].

## 2018-09-07 NOTE — Telephone Encounter (Signed)
Faxed lab orders per Dr. Darnell Level.  Notified pt's daughter, [name] (on dpr), via [place].

## 2018-09-10 ENCOUNTER — Encounter: Payer: Self-pay | Admitting: Family Medicine

## 2018-09-10 LAB — CBC AND DIFFERENTIAL
Hemoglobin: 12 (ref 12.0–16.0)
Platelets: 207 (ref 150–399)
WBC: 6.3

## 2018-09-10 LAB — BASIC METABOLIC PANEL
Creatinine: 0.9 (ref <=1.1)
Glucose: 116
Potassium: 4.7 (ref 3.4–5.3)
Sodium: 134 — AB (ref 137–147)

## 2018-09-10 LAB — HEPATIC FUNCTION PANEL
ALT: 14 (ref 7–35)
AST: 16 (ref 13–35)
Alkaline Phosphatase: 51 (ref 25–125)
Bilirubin, Total: 0.4

## 2018-09-10 LAB — LIPID PANEL
Cholesterol: 132 (ref 0–200)
HDL: 39 (ref 35–70)
LDL Cholesterol: 79
Triglycerides: 69 (ref 40–160)

## 2018-09-10 LAB — HEMOGLOBIN A1C: Hemoglobin A1C: 7.9

## 2018-09-10 LAB — IRON,TIBC AND FERRITIN PANEL
Ferritin: 25
Iron: 54

## 2018-09-10 LAB — TSH: TSH: 4 (ref <=5.90)

## 2018-09-14 ENCOUNTER — Ambulatory Visit (INDEPENDENT_AMBULATORY_CARE_PROVIDER_SITE_OTHER): Payer: BLUE CROSS/BLUE SHIELD | Admitting: Family Medicine

## 2018-09-14 ENCOUNTER — Other Ambulatory Visit: Payer: Self-pay | Admitting: Family Medicine

## 2018-09-14 ENCOUNTER — Encounter: Payer: Self-pay | Admitting: Family Medicine

## 2018-09-14 VITALS — BP 119/72 | HR 91 | Temp 97.6°F | Ht 62.75 in | Wt 128.0 lb

## 2018-09-14 DIAGNOSIS — Z0000 Encounter for general adult medical examination without abnormal findings: Secondary | ICD-10-CM | POA: Diagnosis not present

## 2018-09-14 DIAGNOSIS — M170 Bilateral primary osteoarthritis of knee: Secondary | ICD-10-CM

## 2018-09-14 DIAGNOSIS — E039 Hypothyroidism, unspecified: Secondary | ICD-10-CM

## 2018-09-14 DIAGNOSIS — E611 Iron deficiency: Secondary | ICD-10-CM

## 2018-09-14 DIAGNOSIS — Z8673 Personal history of transient ischemic attack (TIA), and cerebral infarction without residual deficits: Secondary | ICD-10-CM | POA: Diagnosis not present

## 2018-09-14 DIAGNOSIS — E118 Type 2 diabetes mellitus with unspecified complications: Secondary | ICD-10-CM | POA: Diagnosis not present

## 2018-09-14 DIAGNOSIS — E785 Hyperlipidemia, unspecified: Secondary | ICD-10-CM

## 2018-09-14 DIAGNOSIS — R634 Abnormal weight loss: Secondary | ICD-10-CM

## 2018-09-14 DIAGNOSIS — I480 Paroxysmal atrial fibrillation: Secondary | ICD-10-CM

## 2018-09-14 DIAGNOSIS — Z794 Long term (current) use of insulin: Secondary | ICD-10-CM

## 2018-09-14 MED ORDER — TRAZODONE HCL 50 MG PO TABS: 25.0000 mg | ORAL_TABLET | Freq: Every evening | ORAL | 3 refills | Status: DC | PRN

## 2018-09-14 MED ORDER — ATENOLOL 25 MG PO TABS: 25.0000 mg | ORAL_TABLET | Freq: Every day | ORAL | 3 refills | Status: DC

## 2018-09-14 MED ORDER — ATENOLOL 25 MG PO TABS: 25.0000 mg | ORAL_TABLET | Freq: Every day | ORAL | 1 refills | Status: DC

## 2018-09-14 MED ORDER — LEVOTHYROXINE SODIUM 75 MCG PO TABS: 75.0000 ug | ORAL_TABLET | Freq: Every day | ORAL | 3 refills | Status: DC

## 2018-09-14 MED ORDER — METFORMIN HCL 500 MG PO TABS: ORAL_TABLET | ORAL | 3 refills | Status: DC

## 2018-09-14 MED ORDER — RIVAROXABAN 20 MG PO TABS: 20.0000 mg | ORAL_TABLET | Freq: Every day | ORAL | 3 refills | Status: DC

## 2018-09-14 NOTE — Assessment & Plan Note (Addendum)
Ongoing pain - improved with synvisc injections x3 (2018, 2019). Feels weight loss has significantly helped.

## 2018-09-14 NOTE — Assessment & Plan Note (Signed)
Ongoing weight loss noted (8 lbs in last 1 year). rec avoid continued weight loss at this time.

## 2018-09-14 NOTE — Assessment & Plan Note (Signed)
Chronic, stable. Continue current regimen. 

## 2018-09-14 NOTE — Assessment & Plan Note (Signed)
Preventative protocols reviewed and updated unless pt declined. Discussed healthy diet and lifestyle.  

## 2018-09-14 NOTE — Assessment & Plan Note (Addendum)
Continue xarelto. She is not on aspirin or statin.

## 2018-09-14 NOTE — Assessment & Plan Note (Signed)
Maintaining with healthy diet, off oral iron replacement.

## 2018-09-14 NOTE — Assessment & Plan Note (Signed)
Chronic, stable off meds.  

## 2018-09-14 NOTE — Assessment & Plan Note (Signed)
Continue xarelto - started after CVA 2014.

## 2018-09-14 NOTE — Assessment & Plan Note (Signed)
Chronic, improved but still a bit high. Continue current regimen (metformin daily, lantus PRN). Want to avoid hypoglycemia. Marland Kitchen

## 2018-09-14 NOTE — Progress Notes (Signed)
Virtual visit completed through [employer]. Due to national recommendations of social distancing due to COVID-19, a virtual visit is felt to be most appropriate for this patient at this time. Reviewed limitations of a virtual visit.   Patient location: at son's home, son Adela GlimpseBernard also present on call Provider location: [place] [facility] office If any vitals were documented, they were collected by patient at home unless specified below.    BP 119/72   Pulse 91   Temp 97.6 F (36.4 C)   Ht 5' 2.75" (1.594 m)   Wt 128 lb (58.1 kg)   BMI 22.86 kg/m    CC: CPE Subjective:    Patient ID: [name], female    DOB: 01/21/1935, 84 y.o.   MRN: [id number]  HPI: [name] is a 84 y.o. female presenting on 09/14/2018 for Annual Exam   Has been living with son in [place], [place], returning to [place] area unknown due to pandemic. Ongoing weight loss noted - she has been trying. Stays some time in [place] as well.   Ongoing knee pain bilaterally s/p synvisc x1 (08/2017). Not using braces. She continues regular exercise on stationary bicycle daily 30-2560min.   DM - checks sugars fasting daily - 135, 135, 137. No low sugars. Compliant with metformin 500mg  daily, lantus 8u PRN  Taking spirulina 6 tablets daily in place of iron tablets.   Preventative: Colon cancer screening -s/p colonoscopy 2013 [place] Lung cancer screening -non smoker Breast cancer screening - mammo remotely WNL. Declines rpt. Does check breast exams at home without concerns. No fmhx breast cancer.  Well woman exam -s/p hysterectomywith BSO.Underwent premarin treatment after menopause. DEXA scan -2013 normal per patient Flu shot -declines due to bad reactionin the past  Tetanus shot -thinks UTD Prevnar 01/2016. Pneumovax - 08/2017.  Shingles shot -declines Advanced directive discussion -does not have living will. Discussed. Packet provided today (spanish). Does not want dialysis. Ok  with temporary life support, does not want prolonged life support.  Non-smoker - daughter smokes outside  Alcohol - rare  Dentist - has dentures Eye exam - states done [place] 2019  Bowels - constipation - managed with water/fiber and soluble fiber supplement.  Bladder - no incontinence  Lives with daughter Jonell CluckWidow  Originally from [place], moved 2013 G5P5 Activity: stationary bicycle 45 min/day Diet: good water, fruits/vegetables daily     Relevant past medical, surgical, family and social history reviewed and updated as indicated. Interim medical history since our last visit reviewed. Allergies and medications reviewed and updated. Outpatient Medications Prior to Visit  Medication Sig Dispense Refill  . Bioflavonoid Products (BIOFLEX) TABS Take 1 tablet by mouth daily.    . Insulin Glargine (LANTUS SOLOSTAR) 100 UNIT/ML Solostar Pen Inject 8 Units into the skin daily as needed. 15 mL   . Sennosides 15 MG TABS Take 1 tablet by mouth every other day.    Marland Kitchen. atenolol (TENORMIN) 50 MG tablet TAKE 1 TABLET BY MOUTH EVERY DAY 90 tablet 0  . Insulin Glargine (LANTUS SOLOSTAR) 100 UNIT/ML Solostar Pen Inject 8 Units into the skin daily.    Marland Kitchen. LANTUS SOLOSTAR 100 UNIT/ML Solostar Pen INJECT 20-25 UNITS INTO THE SKIN DAILY. 15 mL 3  . levothyroxine (SYNTHROID) 75 MCG tablet Take 1 tablet (75 mcg total) by mouth daily before breakfast. NEEDS OFFICE VISIT 90 tablet 0  . metFORMIN (GLUCOPHAGE) 500 MG tablet TAKE 1 TABLET BY MOUTH EVERY DAY WITH BREAKFAST 90 tablet 0  . traZODone (  DESYREL) 50 MG tablet TAKE 1 TABLET (50 MG TOTAL) BY MOUTH AT BEDTIME AS NEEDED FOR SLEEP. 90 tablet 0  . traZODone (DESYREL) 50 MG tablet Take 0.5-1 tablets (25-50 mg total) by mouth at bedtime as needed for sleep.    Carlena Hurl. XARELTO 20 MG TABS tablet TAKE 1 TABLET (20 MG TOTAL) BY MOUTH DAILY WITH SUPPER, PLEASE USE COUPON ON FILE 90 tablet 0   No facility-administered medications prior to visit.      Per HPI  unless specifically indicated in ROS section below Review of Systems  Constitutional: Negative for activity change, appetite change, chills, fatigue, fever and unexpected weight change.  HENT: Negative for hearing loss.   Eyes: Negative for visual disturbance.  Respiratory: Negative for cough, chest tightness, shortness of breath and wheezing.   Cardiovascular: Negative for chest pain, palpitations and leg swelling.  Gastrointestinal: Negative for abdominal distention, abdominal pain, blood in stool, constipation, diarrhea, nausea and vomiting.       Early satiety - finds she gets full easily, but no dysphagia, heartburn or gerd  Genitourinary: Negative for difficulty urinating and hematuria.  Musculoskeletal: Negative for arthralgias, myalgias and neck pain.  Skin: Negative for rash.  Neurological: Positive for headaches (very intermittent). Negative for dizziness, seizures and syncope.  Hematological: Negative for adenopathy. Does not bruise/bleed easily.  Psychiatric/Behavioral: Negative for dysphoric mood. The patient is not nervous/anxious.    Objective:    BP 119/72   Pulse 91   Temp 97.6 F (36.4 C)   Ht 5' 2.75" (1.594 m)   Wt 128 lb (58.1 kg)   BMI 22.86 kg/m   Wt Readings from Last 3 Encounters:  09/14/18 128 lb (58.1 kg)  08/12/17 136 lb 12 oz (62 kg)  08/10/17 140 lb 4 oz (63.6 kg)     Physical exam: Gen: alert, NAD, not ill appearing Pulm: speaks in complete sentences without increased work of breathing Psych: normal mood, normal thought content      Results for orders placed or performed in visit on 08/18/17  Fecal occult blood, imunochemical   Specimen: Stool  Result Value Ref Range   Fecal Occult Bld Negative Negative   Lab Results  Component Value Date   HGBA1C 8.5 (H) 08/10/2017    Assessment & Plan:   Problem List Items Addressed This Visit    Weight loss    Ongoing weight loss noted (8 lbs in last 1 year). rec avoid continued weight loss at this  time.       Type 2 diabetes mellitus with complication, with long-term current use of insulin (HCC)    Chronic, improved but still a bit high. Continue current regimen (metformin daily, lantus PRN). Want to avoid hypoglycemia. .       Relevant Medications   Insulin Glargine (LANTUS SOLOSTAR) 100 UNIT/ML Solostar Pen   metFORMIN (GLUCOPHAGE) 500 MG tablet   Primary osteoarthritis of both knees    Ongoing pain - improved with synvisc injections x3 (2018, 2019). Feels weight loss has significantly helped.      Iron deficiency    Maintaining with healthy diet, off oral iron replacement.       Hypothyroidism    Chronic, stable. Continue current regimen.       Relevant Medications   levothyroxine (SYNTHROID) 75 MCG tablet   atenolol (TENORMIN) 25 MG tablet   History of ischemic stroke without residual deficits    Continue xarelto. She is not on aspirin or statin.  Health maintenance examination - Primary    Preventative protocols reviewed and updated unless pt declined. Discussed healthy diet and lifestyle.       Dyslipidemia    Chronic, stable off meds.       Atrial fibrillation ([place])    Continue xarelto - started after CVA 2014.       Relevant Medications   rivaroxaban (XARELTO) 20 MG TABS tablet   atenolol (TENORMIN) 25 MG tablet       Meds ordered this encounter  Medications  . DISCONTD: atenolol (TENORMIN) 25 MG tablet    Sig: Take 1 tablet (25 mg total) by mouth daily.    Dispense:  90 tablet    Refill:  1    Note new sig  . rivaroxaban (XARELTO) 20 MG TABS tablet    Sig: Take 1 tablet (20 mg total) by mouth daily with supper.    Dispense:  90 tablet    Refill:  3  . metFORMIN (GLUCOPHAGE) 500 MG tablet    Sig: TAKE 1 TABLET BY MOUTH EVERY DAY WITH BREAKFAST    Dispense:  90 tablet    Refill:  3  . levothyroxine (SYNTHROID) 75 MCG tablet    Sig: Take 1 tablet (75 mcg total) by mouth daily before breakfast.    Dispense:  90 tablet    Refill:  3   . atenolol (TENORMIN) 25 MG tablet    Sig: Take 1 tablet (25 mg total) by mouth daily.    Dispense:  90 tablet    Refill:  3    Note new sig  . traZODone (DESYREL) 50 MG tablet    Sig: Take 0.5-1 tablets (25-50 mg total) by mouth at bedtime as needed for sleep.    Dispense:  60 tablet    Refill:  3   No orders of the defined types were placed in this encounter.   I discussed the assessment and treatment plan with the patient. The patient was provided an opportunity to ask questions and all were answered. The patient agreed with the plan and demonstrated an understanding of the instructions. The patient was advised to call back or seek an in-person evaluation if the symptoms worsen or if the condition fails to improve as anticipated.  Follow up plan: Return in about 1 year (around 09/14/2019) for annual exam, prior fasting for blood work.  Ria Bush, MD  [employer]

## 2018-09-15 ENCOUNTER — Encounter: Payer: Self-pay | Admitting: Family Medicine

## 2018-09-27 ENCOUNTER — Encounter: Payer: Self-pay | Admitting: Family Medicine

## 2018-10-19 ENCOUNTER — Ambulatory Visit: Payer: BLUE CROSS/BLUE SHIELD | Admitting: Family Medicine

## 2019-01-07 ENCOUNTER — Telehealth: Payer: Self-pay | Admitting: Family Medicine

## 2019-01-07 NOTE — Telephone Encounter (Signed)
Spoke with pt's daughter, [name] (on dpr), asking about pt's sxs.  States per her brother, Vicki Mallet (on dpr), pt c/o pain with urination and flank pain.  Pt is currently in [place] with her son.  They are requesting lab work, including having her liver checked, per [name].  Suggested pt be seen in [place].  But they only want to hear Dr. Synthia Innocent recommendations.  Call [place] back at [phone]. Gives permission to lvm.

## 2019-01-07 NOTE — Telephone Encounter (Signed)
Patient's Son called today in regards to his mom.   He would like to know if Dr Darnell Level could order another set of lab work for his mom He stated that she is having pain again in her bladder.

## 2019-01-07 NOTE — Telephone Encounter (Signed)
Needs to be evaluated at local [facility] for possible UTI.  Not advisable to wait days for lab results to return.

## 2019-01-07 NOTE — Telephone Encounter (Signed)
Attempted to contact pt's son, Vicki Mallet, at number provided.  Message states wireless customer is not available.  I spoke with pt's daughter, [name], relaying Dr. Synthia Innocent message.  She verbalizes understanding and will try to contact her brother in [place].

## 2019-03-17 ENCOUNTER — Ambulatory Visit: Payer: BLUE CROSS/BLUE SHIELD | Admitting: Family Medicine

## 2019-03-21 ENCOUNTER — Other Ambulatory Visit: Payer: Self-pay

## 2019-03-21 ENCOUNTER — Ambulatory Visit: Payer: 59 | Admitting: Family Medicine

## 2019-03-21 ENCOUNTER — Encounter: Payer: Self-pay | Admitting: Family Medicine

## 2019-03-21 VITALS — BP 110/80 | HR 59 | Temp 98.5°F | Ht 62.75 in | Wt 125.2 lb

## 2019-03-21 DIAGNOSIS — M170 Bilateral primary osteoarthritis of knee: Secondary | ICD-10-CM

## 2019-03-21 MED ORDER — HYLAN G-F 20 48 MG/6ML IX SOSY: PREFILLED_SYRINGE | Freq: Once | INTRA_ARTICULAR | Status: AC

## 2019-03-21 NOTE — Patient Instructions (Signed)
The MD's in [place] doing the most stem cell injections:  Lenora Boys and Iona Coach at [employer] at [employer]

## 2019-03-21 NOTE — Progress Notes (Signed)
     Deanna Idleman T. Kaeleigh Westendorf, MD Primary Care and Sports Medicine [facility] at [facility] [address] [place], [address] Phone: [phone]  FAX: [phone]  [name] - 85 y.o. female  MRN [id number]  Date of Birth: 01/12/1935  Visit Date: 03/21/2019  PCP: Eustaquio Boyden, MD  Referred by: Eustaquio Boyden, MD  Chief Complaint  Patient presents with  . Knee Pain    Wants Bilateral Synvisc One injections    This visit occurred during the SARS-CoV-2 public health emergency.  Safety protocols were in place, including screening questions prior to the visit, additional usage of staff PPE, and extensive cleaning of exam room while observing appropriate contact time as indicated for disinfecting solutions.    B synvisc injections, procedure only.  They also had some questions about stem cell injections for osteoarthritis and I did my best to describe this from an evidence-based medicine standpoint to them.  Aspiration/Injection Procedure Note [name] 02-26-1935 Date of procedure: 03/21/2019  Procedure: Large Joint Aspiration / Injection of Knee for Viscosupplementation, R Medication: Synvisc-One Indications: Pain  Procedure Details Patient verbally consented to procedure. Risks (including infection), benefits, and alternatives explained. Sterilely prepped with Chloraprep. Ethyl cholride used for anesthesia, then 7 cc of Lidocaine 1% used for anesthesia in the anterolateral position. Reprepped with Chloraprep.  Anterolateral approach used to inject joint without difficulty, injected with Synvisc-One, 6 mL. No complications with procedure and tolerated well.  Aspiration/Injection Procedure Note [name] September 14, 1934 Date of procedure: 03/21/2019  Procedure: Large Joint Aspiration / Injection of Knee for Viscosupplementation, L Medication: Synvisc-One Indications: Pain  Procedure Details Patient verbally consented to procedure. Risks  (including infection), benefits, and alternatives explained. Sterilely prepped with Chloraprep. Ethyl cholride used for anesthesia, then 7 cc of Lidocaine 1% used for anesthesia in the anterolateral position. Reprepped with Chloraprep.  Anterolateral approach used to inject joint without difficulty, injected with Synvisc-One, 6 mL. No complications with procedure and tolerated well.    ICD-10-CM   1. Primary osteoarthritis of both knees  M17.0 Hylan SOSY    Hylan SOSY    Follow-up: No follow-ups on file.  Meds ordered this encounter  Medications  . Hylan SOSY  . Hylan SOSY   No orders of the defined types were placed in this encounter.   Signed,  Elpidio Galea. Nicklos Gaxiola, MD   Outpatient Encounter Medications as of 03/21/2019  Medication Sig  . atenolol (TENORMIN) 25 MG tablet Take 1 tablet (25 mg total) by mouth daily.  Marland Kitchen Bioflavonoid Products (BIOFLEX) TABS Take 1 tablet by mouth daily.  . Insulin Glargine (LANTUS SOLOSTAR) 100 UNIT/ML Solostar Pen Inject 8 Units into the skin daily as needed.  Marland Kitchen levothyroxine (SYNTHROID) 75 MCG tablet Take 1 tablet (75 mcg total) by mouth daily before breakfast.  . metFORMIN (GLUCOPHAGE) 500 MG tablet TAKE 1 TABLET BY MOUTH EVERY DAY WITH BREAKFAST  . rivaroxaban (XARELTO) 20 MG TABS tablet Take 1 tablet (20 mg total) by mouth daily with supper.  . Sennosides 15 MG TABS Take 1 tablet by mouth every other day.  . traZODone (DESYREL) 50 MG tablet Take 0.5-1 tablets (25-50 mg total) by mouth at bedtime as needed for sleep.  . [EXPIRED] Hylan SOSY   . [EXPIRED] Hylan SOSY    No facility-administered encounter medications on file as of 03/21/2019.

## 2019-05-02 ENCOUNTER — Encounter: Payer: Self-pay | Admitting: Family Medicine

## 2019-05-03 ENCOUNTER — Ambulatory Visit: Payer: 59 | Attending: Internal Medicine

## 2019-05-03 ENCOUNTER — Telehealth: Payer: 59 | Admitting: Family Medicine

## 2019-05-03 DIAGNOSIS — Z20822 Contact with and (suspected) exposure to covid-19: Secondary | ICD-10-CM

## 2019-05-03 NOTE — Telephone Encounter (Signed)
Per Dr. Reece Agar, pt was scheduled at 2:00 for virtual visit.  Left message on vm per dpr for pt's daughter, [name], asking her to call back to confirm appt.  Also, sent MyChart message.

## 2019-05-03 NOTE — Telephone Encounter (Signed)
Did you want to squeeze the pt in for a virtual or schedule her with another provider?

## 2019-05-03 NOTE — Telephone Encounter (Signed)
[name] called to cancel appointment.  She stated she was going to go get covid test done today @ 2:30.  After she gets results back she will call to get an in office appointment.  [name] is aware if pt has symptoms sore throat etc pt cannot come in office

## 2019-05-04 ENCOUNTER — Encounter: Payer: Self-pay | Admitting: Family Medicine

## 2019-05-04 LAB — NOVEL CORONAVIRUS, NAA: SARS-CoV-2, NAA: NOT DETECTED

## 2019-05-05 ENCOUNTER — Ambulatory Visit
Admission: EM | Admit: 2019-05-05 | Discharge: 2019-05-05 | Disposition: A | Discharge status: Home / Self Care | Payer: 59

## 2019-05-05 ENCOUNTER — Emergency Department: Payer: 59

## 2019-05-05 ENCOUNTER — Other Ambulatory Visit: Payer: Self-pay

## 2019-05-05 ENCOUNTER — Emergency Department
Admission: EM | Admit: 2019-05-05 | Discharge: 2019-05-05 | Disposition: A | Discharge status: Home / Self Care | Payer: 59 | Attending: Emergency Medicine | Admitting: Emergency Medicine

## 2019-05-05 DIAGNOSIS — Z7901 Long term (current) use of anticoagulants: Secondary | ICD-10-CM | POA: Diagnosis not present

## 2019-05-05 DIAGNOSIS — Z794 Long term (current) use of insulin: Secondary | ICD-10-CM | POA: Insufficient documentation

## 2019-05-05 DIAGNOSIS — J039 Acute tonsillitis, unspecified: Secondary | ICD-10-CM | POA: Diagnosis not present

## 2019-05-05 DIAGNOSIS — I10 Essential (primary) hypertension: Secondary | ICD-10-CM | POA: Diagnosis not present

## 2019-05-05 DIAGNOSIS — J36 Peritonsillar abscess: Secondary | ICD-10-CM | POA: Insufficient documentation

## 2019-05-05 DIAGNOSIS — Z8673 Personal history of transient ischemic attack (TIA), and cerebral infarction without residual deficits: Secondary | ICD-10-CM | POA: Insufficient documentation

## 2019-05-05 DIAGNOSIS — Z79899 Other long term (current) drug therapy: Secondary | ICD-10-CM | POA: Insufficient documentation

## 2019-05-05 DIAGNOSIS — R070 Pain in throat: Secondary | ICD-10-CM | POA: Diagnosis present

## 2019-05-05 DIAGNOSIS — I4891 Unspecified atrial fibrillation: Secondary | ICD-10-CM | POA: Diagnosis not present

## 2019-05-05 DIAGNOSIS — E119 Type 2 diabetes mellitus without complications: Secondary | ICD-10-CM | POA: Diagnosis not present

## 2019-05-05 DIAGNOSIS — E039 Hypothyroidism, unspecified: Secondary | ICD-10-CM | POA: Diagnosis not present

## 2019-05-05 LAB — CBC
HCT: 30.4 % — ABNORMAL LOW (ref 36.0–46.0)
Hemoglobin: 9.9 g/dL — ABNORMAL LOW (ref 12.0–15.0)
MCH: 28.1 pg (ref 26.0–34.0)
MCHC: 32.6 g/dL (ref 30.0–36.0)
MCV: 86.4 fL (ref 80.0–100.0)
Platelets: 224 10*3/uL (ref 150–400)
RBC: 3.52 MIL/uL — ABNORMAL LOW (ref 3.87–5.11)
RDW: 12.9 % (ref 11.5–15.5)
WBC: 7 10*3/uL (ref 4.0–10.5)
nRBC: 0 % (ref 0.0–0.2)

## 2019-05-05 LAB — BASIC METABOLIC PANEL
Anion gap: 8 (ref 5–15)
BUN: 13 mg/dL (ref 8–23)
CO2: 26 mmol/L (ref 22–32)
Calcium: 8.6 mg/dL — ABNORMAL LOW (ref 8.9–10.3)
Chloride: 98 mmol/L (ref 98–111)
Creatinine, Ser: 0.69 mg/dL (ref 0.44–1.00)
GFR calc Af Amer: >60 mL/min (ref >60)
GFR calc non Af Amer: >60 mL/min (ref >60)
Glucose, Bld: 122 mg/dL — ABNORMAL HIGH (ref 70–99)
Potassium: 3.9 mmol/L (ref 3.5–5.1)
Sodium: 132 mmol/L — ABNORMAL LOW (ref 135–145)

## 2019-05-05 LAB — TROPONIN I (HIGH SENSITIVITY): Troponin I (High Sensitivity): 3 ng/L (ref <18)

## 2019-05-05 LAB — MAGNESIUM: Magnesium: 1.8 mg/dL (ref 1.7–2.4)

## 2019-05-05 MED ORDER — METOPROLOL TARTRATE 25 MG PO TABS
25.0000 mg | ORAL_TABLET | Freq: Once | ORAL | Status: AC
  Administered 2019-05-05: 25 mg via ORAL
  Filled 2019-05-05: qty 1

## 2019-05-05 MED ORDER — DEXAMETHASONE SODIUM PHOSPHATE 10 MG/ML IJ SOLN
8.0000 mg | Freq: Once | INTRAMUSCULAR | Status: AC
  Administered 2019-05-05: 8 mg via INTRAVENOUS
  Filled 2019-05-05: qty 1

## 2019-05-05 MED ORDER — SODIUM CHLORIDE 0.9 % IV SOLN
3.0000 g | Freq: Once | INTRAVENOUS | Status: AC
  Administered 2019-05-05: 3 g via INTRAVENOUS
  Filled 2019-05-05: qty 8

## 2019-05-05 MED ORDER — PREDNISONE 20 MG PO TABS: 60.0000 mg | ORAL_TABLET | Freq: Every day | ORAL | 0 refills | Status: AC

## 2019-05-05 MED ORDER — AMOXICILLIN-POT CLAVULANATE 875-125 MG PO TABS: 1.0000 | ORAL_TABLET | Freq: Two times a day (BID) | ORAL | 0 refills | Status: AC

## 2019-05-05 MED ORDER — IOHEXOL 300 MG/ML  SOLN
75.0000 mL | Freq: Once | INTRAMUSCULAR | Status: AC | PRN
  Administered 2019-05-05: 75 mL via INTRAVENOUS

## 2019-05-05 NOTE — ED Triage Notes (Signed)
First nurse: Daughter brings pt to ER from UC for abscess to tonsils. Daughter refusing to leave mother stating that she will be interpreter for patient. Informed to daughter that we can provided an interpreter. Daughter remains adamant about being interpreter for her mother. Asked to wait in lobby to be assessed at this time. No distress noted. Daughter would not tell staff why she was told to come to the ER, stating "it's been a long day and I've been stressed."

## 2019-05-05 NOTE — ED Provider Notes (Addendum)
-----------------------------------------   3:00 PM on 05/05/2019 -----------------------------------------  Blood pressure (!) 132/91, pulse 93, temperature 99.1 F (37.3 C), temperature source Oral, resp. rate 19, height 5' 3.78" (1.62 m), weight 49.4 kg, SpO2 99 %.  Assuming care from Dr. Cyril Loosen.  In short, [name] is a 85 y.o. female with a chief complaint of Sore Throat .  Refer to the original H&P for additional details.  The current plan of care is to follow-up CT results for suspected PTA, plan to discuss possible drainage with ENT.  ----------------------------------------- 5:50 PM on 05/05/2019 -----------------------------------------  Per radiology, patient's tonsillar mass is concerning for neoplasm. Patient evaluated by Dr. Andee Poles of ENT, who feels that infectious etiology he is more likely than neoplasm given appearance of lesion on exam.  He recommends continuing steroids and Augmentin and he will then see patient in his office for follow-up.  Patient also noted to have highly variable heart rate that will often reach up to the 150s.  When placed on the cardiac monitor, she is noted to be in atrial fibrillation with RVR but no ischemic changes on EKG.  Patient also states she is asymptomatic related to this, currently denies any chest pain, shortness of breath, or palpitations.  IV was previously removed due to patient's complaints of pain at the site, she and daughter adamantly refuse replacement of IV for any rate control medications.  Case discussed with Dr. Lady Gary of cardiology, who agrees with plan for p.o. dose of 25 mg metoprolol and close follow-up in his office tomorrow morning at 10 AM.  Patient is already anticoagulated on Xarelto and has been compliant with this.  Follow-up troponin and magnesium are within normal limits and she is appropriate for discharge home.  I have counseled her and daughter to return to the ED for any worsening symptoms.  ED ECG REPORT I,  Chesley Noon, the attending physician, personally viewed and interpreted this ECG.   Date: 05/05/2019  EKG Time: 17:08  Rate: 102  Rhythm: atrial fibrillation, rate 102  Axis: Normal  Intervals:none  ST&T Change: None     Chesley Noon, MD 05/05/19 1754    Chesley Noon, MD 05/05/19 1758

## 2019-05-05 NOTE — ED Notes (Signed)
Gave the pt food tray ([place] sandwich)

## 2019-05-05 NOTE — ED Notes (Signed)
Removed pt's IV per pt request w/ guidance from ENT.

## 2019-05-05 NOTE — ED Provider Notes (Signed)
[facility] Emergency Department Provider Note   ____________________________________________    I have reviewed the triage vital signs and the nursing notes.   HISTORY  Chief Complaint Sore Throat  Spanish interpreter used   HPI [name] is a 85 y.o. female with a history of atrial fibrillation, on Xarelto brought in with complaints of sore throat.  Patient was seen in urgent care and diagnosed with peritonsillar abscess and sent to the ED for treatment.  Patient reports difficulty tolerating p.o.'s, some fever.  No history of the same.  No sick contacts.  Past Medical History:  Diagnosis Date  . Allergy   . Atrial fibrillation ([place]) 2014   presumed as on xarelto after CVA 2014  . Bilateral primary osteoarthritis of knee    severe by xrays  . Carotid stenosis 10/25/2015   Mild by [place] 2014  . Diabetes mellitus without complication ([place]) 4332  . Heart disease   . Hepatitis    age 82  . HLD (hyperlipidemia)   . Hypertension   . Hypothyroidism   . Ischemic stroke ([place]) 2014   R basal ganglion infarct with L hemiparesis 2014, now largely resolved, uses cane  . Rheumatic fever   . Wears dentures    upper and lower    Patient Active Problem List   Diagnosis Date Noted  . Anemia 08/12/2017  . Weight loss 08/10/2017  . Health maintenance examination 01/26/2016  . Advanced care planning/counseling discussion 01/26/2016  . Insomnia 01/26/2016  . Iron deficiency 01/26/2016  . Carotid stenosis 10/25/2015  . Osteopenia 10/25/2015  . Primary osteoarthritis of both knees 10/24/2015  . Type 2 diabetes mellitus with complication, with long-term current use of insulin ([place]) 10/24/2015  . Corns and callus 10/24/2015  . Dyslipidemia   . Hypothyroidism   . History of ischemic stroke without residual deficits 03/03/2012  . Atrial fibrillation ([place]) 03/03/2012    Past Surgical History:  Procedure Laterality Date  . CATARACT EXTRACTION W/PHACO  Right 12/25/2015   Procedure: CATARACT EXTRACTION PHACO AND INTRAOCULAR LENS PLACEMENT (IOC);  Surgeon: Eulogio Bear, MD;  Location: [place];  Service: Ophthalmology;  Laterality: Right;  DIABETIC NEEDS INTERPRETER RIGHT  . COLONOSCOPY  2013   per patient WNL ([place])  . TOTAL ABDOMINAL HYSTERECTOMY W/ BILATERAL SALPINGOOPHORECTOMY  1972    Prior to Admission medications   Medication Sig Start Date End Date Taking? Authorizing Provider  atenolol (TENORMIN) 25 MG tablet Take 1 tablet (25 mg total) by mouth daily. 09/14/18   Ria Bush, MD  Bioflavonoid Products [facility]) TABS Take 1 tablet by mouth daily.    [provider]  Insulin Glargine (LANTUS SOLOSTAR) 100 UNIT/ML Solostar Pen Inject 8 Units into the skin daily as needed. 09/14/18   Ria Bush, MD  levothyroxine (SYNTHROID) 75 MCG tablet Take 1 tablet (75 mcg total) by mouth daily before breakfast. 09/14/18   Ria Bush, MD  metFORMIN (GLUCOPHAGE) 500 MG tablet TAKE 1 TABLET BY MOUTH EVERY DAY WITH BREAKFAST 09/14/18   Ria Bush, MD  rivaroxaban (XARELTO) 20 MG TABS tablet Take 1 tablet (20 mg total) by mouth daily with supper. 09/14/18   Ria Bush, MD  Sennosides 15 MG TABS Take 1 tablet by mouth every other day.    [provider]  traZODone (DESYREL) 50 MG tablet Take 0.5-1 tablets (25-50 mg total) by mouth at bedtime as needed for sleep. 09/14/18   Ria Bush, MD     Allergies Chloramphenicols and Influenza  vaccines  History reviewed. No pertinent family history.  Social History Social History   Tobacco Use  . Smoking status: Never Smoker  . Smokeless tobacco: Never Used  Substance Use Topics  . Alcohol use: No  . Drug use: No    Review of Systems  Constitutional: Positive fever Eyes: No visual changes.  ENT: As above Cardiovascular: Denies chest pain. Respiratory: Denies shortness of breath. Gastrointestinal: No abdominal pain.     Genitourinary: Negative for dysuria. Musculoskeletal: Negative for back pain. Skin: Negative for rash. Neurological: Negative for headaches   ____________________________________________   PHYSICAL EXAM:  VITAL SIGNS: ED Triage Vitals  Enc Vitals Group     BP 05/05/19 1228 (!) 122/92     Pulse Rate 05/05/19 1228 87     Resp 05/05/19 1228 18     Temp 05/05/19 1228 99.1 F (37.3 C)     Temp Source 05/05/19 1228 Oral     SpO2 05/05/19 1228 99 %     Weight 05/05/19 1226 49.4 kg (109 lb)     Height 05/05/19 1226 1.62 m (5' 3.78")     Head Circumference --      Peak Flow --      Pain Score 05/05/19 1226 8     Pain Loc --      Pain Edu? --      Excl. in GC? --     Constitutional: Alert and oriented.  Eyes: Conjunctivae are normal.  Head: Atraumatic. Nose: No congestion/rhinnorhea. Mouth/Throat: Mucous membranes are moist.  Right PTA, nonobstructing, pointing Neck:  Painless ROM Cardiovascular: Normal rate, regular rhythm.  Good peripheral circulation. Respiratory: Normal respiratory effort.  No retractions. Lungs CTAB. Gastrointestinal: Soft and nontender. No distention  Musculoskeletal:   Warm and well perfused Neurologic:  Normal speech and language. No gross focal neurologic deficits are appreciated.  Skin:  Skin is warm, dry and intact. No rash noted.   ____________________________________________   LABS (all labs ordered are listed, but only abnormal results are displayed)  Labs Reviewed  CBC  BASIC METABOLIC PANEL   ____________________________________________  EKG  None ____________________________________________  RADIOLOGY  CT soft tissue neck ____________________________________________   PROCEDURES  Procedure(s) performed: No  Procedures   Critical Care performed: No ____________________________________________   INITIAL IMPRESSION / ASSESSMENT AND PLAN / ED COURSE  Pertinent labs & imaging results that were available during my  care of the patient were reviewed by me and considered in my medical decision making (see chart for details).  Patient with right PT on exam, discussed with Dr. Andee Poles of ENT.  Recommends IV steroids and antibiotics, he will perform I&D.  Dr. Andee Poles noted Xarelto on med rec, has asked for CT scan to determine whether I&D could possibly wait  Asked nurse to expedite labs and CT  Have asked my colleague to follow-up on CT and discussed with Dr. Sheran Spine    ____________________________________________   FINAL CLINICAL IMPRESSION(S) / ED DIAGNOSES  Final diagnoses:  Peritonsillar abscess        Note:  This document was prepared using Dragon voice recognition software and may include unintentional dictation errors.   Jene Every, MD 05/05/19 [phone]

## 2019-05-05 NOTE — ED Provider Notes (Signed)
Renaldo Fiddler    CSN: [id number] Arrival date & time: 05/05/19  1124      History   Chief Complaint Chief Complaint  Patient presents with  . Oral Swelling    HPI [name] is a 85 y.o. female.   Accompanied by her daughter, patient presents with swelling of her tonsil on the right side.  Daughter states it has been getting progressively worse over the past few days.  She reports pain with swallowing and pain in her right ear down to her neck.  She denies fever or chills.    The history is provided by the patient and a relative.    Past Medical History:  Diagnosis Date  . Allergy   . Atrial fibrillation (HCC) 2014   presumed as on xarelto after CVA 2014  . Bilateral primary osteoarthritis of knee    severe by xrays  . Carotid stenosis 10/25/2015   Mild by [place] 2014  . Diabetes mellitus without complication (HCC) 1994  . Heart disease   . Hepatitis    age 43  . HLD (hyperlipidemia)   . Hypertension   . Hypothyroidism   . Ischemic stroke (HCC) 2014   R basal ganglion infarct with L hemiparesis 2014, now largely resolved, uses cane  . Rheumatic fever   . Wears dentures    upper and lower    Patient Active Problem List   Diagnosis Date Noted  . Anemia 08/12/2017  . Weight loss 08/10/2017  . Health maintenance examination 01/26/2016  . Advanced care planning/counseling discussion 01/26/2016  . Insomnia 01/26/2016  . Iron deficiency 01/26/2016  . Carotid stenosis 10/25/2015  . Osteopenia 10/25/2015  . Primary osteoarthritis of both knees 10/24/2015  . Type 2 diabetes mellitus with complication, with long-term current use of insulin (HCC) 10/24/2015  . Corns and callus 10/24/2015  . Dyslipidemia   . Hypothyroidism   . History of ischemic stroke without residual deficits 03/03/2012  . Atrial fibrillation (HCC) 03/03/2012    Past Surgical History:  Procedure Laterality Date  . CATARACT EXTRACTION W/PHACO Right 12/25/2015   Procedure: CATARACT  EXTRACTION PHACO AND INTRAOCULAR LENS PLACEMENT (IOC);  Surgeon: Nevada Crane, MD;  Location: [facility];  Service: Ophthalmology;  Laterality: Right;  DIABETIC NEEDS INTERPRETER RIGHT  . COLONOSCOPY  2013   per patient WNL ([place])  . TOTAL ABDOMINAL HYSTERECTOMY W/ BILATERAL SALPINGOOPHORECTOMY  1972    OB History   No obstetric history on file.      Home Medications    Prior to Admission medications   Medication Sig Start Date End Date Taking? Authorizing Provider  atenolol (TENORMIN) 25 MG tablet Take 1 tablet (25 mg total) by mouth daily. 09/14/18  Yes Eustaquio Boyden, MD  Bioflavonoid Products [facility]) TABS Take 1 tablet by mouth daily.   Yes [provider]  Insulin Glargine (LANTUS SOLOSTAR) 100 UNIT/ML Solostar Pen Inject 8 Units into the skin daily as needed. 09/14/18  Yes Eustaquio Boyden, MD  levothyroxine (SYNTHROID) 75 MCG tablet Take 1 tablet (75 mcg total) by mouth daily before breakfast. 09/14/18  Yes Eustaquio Boyden, MD  metFORMIN (GLUCOPHAGE) 500 MG tablet TAKE 1 TABLET BY MOUTH EVERY DAY WITH BREAKFAST 09/14/18  Yes Eustaquio Boyden, MD  rivaroxaban (XARELTO) 20 MG TABS tablet Take 1 tablet (20 mg total) by mouth daily with supper. 09/14/18  Yes Eustaquio Boyden, MD  Sennosides 15 MG TABS Take 1 tablet by mouth every other day.   Yes [provider]  traZODone (  DESYREL) 50 MG tablet Take 0.5-1 tablets (25-50 mg total) by mouth at bedtime as needed for sleep. 09/14/18  Yes Ria Bush, MD    Family History History reviewed. No pertinent family history.  Social History Social History   Tobacco Use  . Smoking status: Never Smoker  . Smokeless tobacco: Never Used  Substance Use Topics  . Alcohol use: No  . Drug use: No     Allergies   Chloramphenicols and Influenza vaccines   Review of Systems Review of Systems  Constitutional: Negative for chills and fever.  HENT: Positive for sore throat and trouble  swallowing. Negative for ear pain.   Eyes: Negative for pain and visual disturbance.  Respiratory: Negative for cough and shortness of breath.   Cardiovascular: Negative for chest pain and palpitations.  Gastrointestinal: Negative for abdominal pain and vomiting.  Genitourinary: Negative for dysuria and hematuria.  Musculoskeletal: Negative for arthralgias and back pain.  Skin: Negative for color change and rash.  Neurological: Negative for seizures and syncope.  All other systems reviewed and are negative.    Physical Exam Triage Vital Signs ED Triage Vitals  Enc Vitals Group     BP 05/05/19 1131 128/61     Pulse Rate 05/05/19 1131 (!) 120     Resp 05/05/19 1131 18     Temp 05/05/19 1131 98.5 F (36.9 C)     Temp Source 05/05/19 1131 Oral     SpO2 05/05/19 1131 97 %     Weight --      Height --      Head Circumference --      Peak Flow --      Pain Score 05/05/19 1127 6     Pain Loc --      Pain Edu? --      Excl. in [place]? --    No data found.  Updated Vital Signs BP 128/61   Pulse (!) 120   Temp 98.5 F (36.9 C) (Oral)   Resp 18   SpO2 97%   Visual Acuity Right Eye Distance:   Left Eye Distance:   Bilateral Distance:    Right Eye Near:   Left Eye Near:    Bilateral Near:     Physical Exam Vitals and nursing note reviewed.  Constitutional:      General: She is not in acute distress.    Appearance: She is well-developed.     Comments: Frail, elderly.  HENT:     Head: Normocephalic and atraumatic.     Right Ear: Tympanic membrane normal.     Left Ear: Tympanic membrane normal.     Nose: Nose normal.     Mouth/Throat:     Mouth: Mucous membranes are moist.     Pharynx: Uvula midline. Posterior oropharyngeal erythema present.     Tonsils: 3+ on the right.     Comments: Speaking without difficulty.   Eyes:     Conjunctiva/sclera: Conjunctivae normal.  Cardiovascular:     Rate and Rhythm: Normal rate and regular rhythm.     Heart sounds: No murmur.   Pulmonary:     Effort: Pulmonary effort is normal. No respiratory distress.     Breath sounds: Normal breath sounds.     Comments: No difficulty breathing.   Abdominal:     General: Bowel sounds are normal.     Palpations: Abdomen is soft.     Tenderness: There is no abdominal tenderness. There is no guarding or rebound.  Musculoskeletal:  Cervical back: Neck supple.  Skin:    General: Skin is warm and dry.     Findings: No rash.  Neurological:     General: No focal deficit present.     Mental Status: She is alert.     Comments: In wheelchair.       UC Treatments / Results  Labs (all labs ordered are listed, but only abnormal results are displayed) Labs Reviewed - No data to display  EKG   Radiology No results found.  Procedures Procedures (including critical care time)  Medications Ordered in UC Medications - No data to display  Initial Impression / Assessment and Plan / UC Course  I have reviewed the triage vital signs and the nursing notes.  Pertinent labs & imaging results that were available during my care of the patient were reviewed by me and considered in my medical decision making (see chart for details).    Acute tonsillitis.  Sending patient to the emergency department for evaluation due to the possibility of peritonsillar abscess.  Her daughter is transporting her to the ED via POV; they declined EMS.     Final Clinical Impressions(s) / UC Diagnoses   Final diagnoses:  Acute tonsillitis, unspecified etiology     Discharge Instructions     Go to the emergency department for evaluation of the swelling in your throat.        ED Prescriptions    None     PDMP not reviewed this encounter.   Mickie Bail, NP 05/05/19 [phone]

## 2019-05-05 NOTE — Telephone Encounter (Addendum)
Left message on vm per dpr for [name] informing her if she wants pt to be seen now, she would have to call and schedule a virtual visit due to pt's current sxs.  Otherwise, she would need to wait until pt is no longer having fever and sore throat and call to schedule an in office visit.   Also sent message in MyChart.

## 2019-05-05 NOTE — ED Notes (Signed)
Provided pt's daughter w/ wipes to clean pt's hands. Introduced myself to pt's daughter and showed her where the call bell was located for assistance.

## 2019-05-05 NOTE — ED Notes (Signed)
Called and spoke with Ms [name] [phone] for approx concerning her mothers visit. [name] stated "that she was very good friend with [name] with interpreter services". And was mentioning" that this is discrimination and that the family makes medical decision for my mother".  This Clinical research associate asked if [name] was the POA and stated "no that it is written in her records from her family doctor that we do".  [name] explained that her mother has cognitive issues, some days are better than others".  I did ask Ms [name] if she had access to an iPhone where we could connect her with her mother with an iPad that we had in the department and they could face time each other. Ms [name] appreciated that gesture, this writer was give [phone] to call from the iPad. In the process of connecting, medical staff had called [name] and requested that she would come to the patients bedside to discuss further treatment / plan of care.  [name] from interpreter services escorted [name] to he patients bedside.

## 2019-05-05 NOTE — ED Triage Notes (Signed)
Daughter states that pt has swelling in her throat and has possible tonsilar abscess. Pt unable to get fluids and food down. Daughter states it started about a week ago. Daughter states that pt makes her own decisions.

## 2019-05-05 NOTE — ED Notes (Signed)
Pt daughter updated at this time. Spoke to daughter for 10 mins on the phone.  Pt daughter states pt is not able to make decisions on her own and daughter is requesting to be with pt. Due to current visitor restrictions pt does not meet the criteria to have a visitor in the room. Pt is alert and oriented x 4 and states that she makes her own medical decisions at this time. Pt currently agreeing to treatment plan of I&D, and IV steroids and antibiotics at this time.

## 2019-05-05 NOTE — ED Notes (Signed)
ENT and interpreter at bedside at this time. Daughter is at bedside as well.

## 2019-05-05 NOTE — ED Notes (Signed)
Patient discharged to home per MD order. Patient in stable condition, and deemed medically cleared by ED provider for discharge. Discharge instructions reviewed with patient/family using "Teach Back"; verbalized understanding of medication education and administration, and information about follow-up care. Denies further concerns. ° °

## 2019-05-05 NOTE — ED Notes (Signed)
Attempted to contact pt daughter [name] [phone], busy tone

## 2019-05-05 NOTE — Consult Note (Signed)
[name], [name] [id number] 03-07-1934 Blake Divine, MD  Reason for Consult: Peritonsillar abscess  HPI: 85 y.o. female (daughter says 49 is correct age) presents to ED from Urgent care for evaluation for peritonsillar abscess.  Patient, through interpreter, reports 3 day history of acute onset of right sided throat pain.  She reports increase in pain and presented to Urgent Care who was concerned with possible abscess.  Evaluated here and CT scan obtained.  This demonstrated swelling of right tonsil but no obvious abscess.  Possible neoplasm versus infection.  Patient and daughter deny breathing issues.  She has tried home remedies.  Allergies:  Allergies  Allergen Reactions  . Chloramphenicols Swelling    Moderate facial swelling  . Influenza Vaccines Other (See Comments)    Bad reaction    ROS: Review of systems normal other than 12 systems except per HPI.  PMH:  Past Medical History:  Diagnosis Date  . Allergy   . Atrial fibrillation ([place]) 2014   presumed as on xarelto after CVA 2014  . Bilateral primary osteoarthritis of knee    severe by xrays  . Carotid stenosis 10/25/2015   Mild by [place] 2014  . Diabetes mellitus without complication ([place]) 0626  . Heart disease   . Hepatitis    age 74  . HLD (hyperlipidemia)   . Hypertension   . Hypothyroidism   . Ischemic stroke ([place]) 2014   R basal ganglion infarct with L hemiparesis 2014, now largely resolved, uses cane  . Rheumatic fever   . Wears dentures    upper and lower    FH: History reviewed. No pertinent family history.  SH:  Social History   Socioeconomic History  . Marital status: Widowed    Spouse name: Not on file  . Number of children: Not on file  . Years of education: Not on file  . Highest education level: Not on file  Occupational History  . Not on file  Tobacco Use  . Smoking status: Never Smoker  . Smokeless tobacco: Never Used  Substance and Sexual Activity  . Alcohol use: No  . Drug use: No   . Sexual activity: Not on file  Other Topics Concern  . Not on file  Social History Narrative   Lives with daughter   Sallee Provencal from [place], moved 2013   G5P5   Activity: stationary bicycle 30 min /day   Diet: good water, fruits/vegetables daily   Social Determinants of Health   Financial Resource Strain:   . Difficulty of Paying Living Expenses: Not on file  Food Insecurity:   . Worried About Charity fundraiser in the Last Year: Not on file  . Ran Out of Food in the Last Year: Not on file  Transportation Needs:   . Lack of Transportation (Medical): Not on file  . Lack of Transportation (Non-Medical): Not on file  Physical Activity:   . Days of Exercise per Week: Not on file  . Minutes of Exercise per Session: Not on file  Stress:   . Feeling of Stress : Not on file  Social Connections:   . Frequency of Communication with Friends and Family: Not on file  . Frequency of Social Gatherings with Friends and Family: Not on file  . Attends Religious Services: Not on file  . Active Member of Clubs or Organizations: Not on file  . Attends Archivist Meetings: Not on file  . Marital Status: Not on file  Intimate  Partner Violence:   . Fear of Current or Ex-Partner: Not on file  . Emotionally Abused: Not on file  . Physically Abused: Not on file  . Sexually Abused: Not on file    PSH:  Past Surgical History:  Procedure Laterality Date  . CATARACT EXTRACTION W/PHACO Right 12/25/2015   Procedure: CATARACT EXTRACTION PHACO AND INTRAOCULAR LENS PLACEMENT (IOC);  Surgeon: Nevada Crane, MD;  Location: [facility];  Service: Ophthalmology;  Laterality: Right;  DIABETIC NEEDS INTERPRETER RIGHT  . COLONOSCOPY  2013   per patient WNL ([place])  . TOTAL ABDOMINAL HYSTERECTOMY W/ BILATERAL SALPINGOOPHORECTOMY  1972    Physical  Exam:  GEN-  CN 2-12 grossly intact and symmetric EARS-  External ears clear NOSE- clear anteriorly OC/OP-  Right  sided tonsillar edema and erythema of superior aspect, soft to palpation but tender as well NECK-  No LAD RESP- unlabored CARD-  irregular  CT- CT scan obtained that showed right sided tonsil enlargement with tonsillolithasis and heterogenous nature with possible phlegmon versus mass, partial retropharyngeal course of carotid.   A/P: Right tonsil enlargement with most likley unilateral tonsillitis given presentation  Plan:  Already got Unasyn here in ER.  Recommend Augmentin and Prednisone 6 day taper and follow up with myself early next week for evaluation.  May need drainage versus biopsy depending on response to medications.   Bud Face 05/05/2019 4:35 PM

## 2019-05-05 NOTE — Telephone Encounter (Signed)
Patient's daughter called back  She stated that she is very upset that her mother can not come in because she has a fever  Patient's daughter stated that she doesn't understand why patient's with negative results can not come in to see their doctor,   Patient's daughter will be taking her mother to the [facility] in [place] to be seen

## 2019-05-05 NOTE — ED Notes (Signed)
Pt transported to CT at this time.

## 2019-05-05 NOTE — ED Notes (Signed)
Assisted pt to the toilet w/ Lorrie RN.

## 2019-05-05 NOTE — Discharge Instructions (Addendum)
Go to the emergency department for evaluation of the swelling in your throat.

## 2019-05-05 NOTE — ED Triage Notes (Signed)
Patient presents to [facility] with daughter. Patient daughter reports that patient has been complaining of throat pain, patient has significant swelling on right side of her throat. States that she has not been eating like she is supposed to. States that she has been complaining of ear pain that radiates down her neck.

## 2019-05-05 NOTE — ED Notes (Signed)
Pt attempting to leave. Interpretor on stick used to interpret until in person interpretor could be utilized. Pt states " I do not want to go to the hospital, I would rather die than go to the hospital". When in person interpretor arrived staff was able to deescalate the situation and convince pt to stay and be seen by MD. Pt waiting in room for assessment at this time

## 2019-05-05 NOTE — ED Notes (Signed)
Attempted to call family of patient , [name] [phone] , busy tone

## 2019-05-05 NOTE — ED Notes (Signed)
Long conversation with the patient's daughter about the visitor policy. I reviewed the policy posted at the front door and explained that she would be contacted if needed. I even offered to send her text messages in the event her phone was not working.

## 2019-05-13 ENCOUNTER — Telehealth: Payer: Self-pay

## 2019-05-13 ENCOUNTER — Telehealth: Payer: Self-pay | Admitting: Family Medicine

## 2019-05-13 DIAGNOSIS — F0150 Vascular dementia without behavioral disturbance: Secondary | ICD-10-CM | POA: Insufficient documentation

## 2019-05-13 DIAGNOSIS — G3184 Mild cognitive impairment, so stated: Secondary | ICD-10-CM | POA: Insufficient documentation

## 2019-05-13 NOTE — Telephone Encounter (Signed)
[facility] Night - Client Nonclinical Telephone Record AccessNurse Client [facility] Night - Client Client Site [facility] - Night Physician Eustaquio Boyden - MD Contact Type Call Who Is Calling Patient / Member / Family / Caregiver Caller Name [name] [facility] Caller Phone Number [phone] Patient Name [name] Patient DOB 13-Nov-1934 Call Type Message Only Information Provided Reason for Call Request to Schedule Office Appointment Initial Comment Caller states her mother needs to schedule a follow up appointment Additional Comment Hours Provided Disp. Time Disposition Final User 05/12/2019 7:44:29 PM General Information Provided Yes [place], Anna Call Closed By: Benny Lennert Transaction Date/Time: 05/12/2019 7:40:44 PM (ET)

## 2019-05-13 NOTE — Telephone Encounter (Signed)
Noted  

## 2019-05-13 NOTE — Telephone Encounter (Signed)
Note placed in patient's chart.  Thank you.

## 2019-05-13 NOTE — Telephone Encounter (Signed)
Mrs [name] (DPR signed) said that pt fell on 05/12/19 in the morning; Mrs [name] said she was with her mom on 05/12/19 at 8:30 AM and pt advised Mrs [name] that she had fallen and pt managed to get back in her bed by herself. Not sure how or why pt fell. Is not sure if pt lost consciousness or not. Mrs [name] said "pt has concussion at back of head with dark blue bruise the size of Mrs Leach's palm with some swelling but no visible bleeding". Mrs [name] applied ice to pts head. Now pt has no H/A or dizziness but pt is feeling tired. Advised Mrs [name] pt should go to ED for eval and testing. Mrs [name] refusing to take her mother to ED again; had bad experience at ED. I spoke with Dr Sharen Hones and he said pt needed to be evaluated at ED due to hitting her head and since pt is on blood thinner,Xarelto. Mrs [name] said since pt is not bleeding she will wait to be seen next wk. I advised pt should not wait to be seen next wk due to possibility of pt bleeding internally due to blood thinner. Per Dr Sharen Hones advised could possibly take pt to [facility] for ED evaluation. Mrs [name] said her son said to go to [facility] or [place]. Mrs [name] asked me which of those 2 hospitals would I recommend. I advised I could not give a recommendation that it was the pt or pts family preference; both [facility].Mrs [name] said she would decide whether to take to [facility], [place] or [place]. I offered to call ahead to ED to let them know pt was coming and why pt needed ED eval but I would need to know which ED to call.I asked if Mrs [name] would cb with update on pt after seen at an ED. Mrs [name] said she would let Dr Sharen Hones know how her mother was doing and she also said after the last ED visit Mrs [name] was requesting Dr Sharen Hones to put a letter in pts chart that since pt had a stroke 4 years ago is not 100%; pt has good days and bad days understanding and remembering so that Mrs [name] would be  allowed to go with pt in the ED. I also advised Mrs [name] might consider a family member getting a medical POA. Mrs [name] will discuss that with Dr Sharen Hones at next FU appt but wants Dr Sharen Hones to go ahead and put letter in pts chart about needing someone with her.

## 2019-05-13 NOTE — Telephone Encounter (Signed)
Called patient and left voicemail to call back to get scheduled for OV.

## 2019-05-17 ENCOUNTER — Other Ambulatory Visit: Payer: Self-pay

## 2019-05-17 ENCOUNTER — Ambulatory Visit (INDEPENDENT_AMBULATORY_CARE_PROVIDER_SITE_OTHER): Payer: 59 | Admitting: Podiatry

## 2019-05-17 DIAGNOSIS — B351 Tinea unguium: Secondary | ICD-10-CM | POA: Diagnosis not present

## 2019-05-17 DIAGNOSIS — M79675 Pain in left toe(s): Secondary | ICD-10-CM

## 2019-05-17 DIAGNOSIS — D689 Coagulation defect, unspecified: Secondary | ICD-10-CM

## 2019-05-17 DIAGNOSIS — M79674 Pain in right toe(s): Secondary | ICD-10-CM | POA: Diagnosis not present

## 2019-05-18 ENCOUNTER — Encounter: Payer: Self-pay | Admitting: Podiatry

## 2019-05-18 NOTE — Progress Notes (Signed)
  Subjective:  Patient ID: [name], female    DOB: 06-Jul-1934,  MRN: [id number]  Chief Complaint  Patient presents with  . Diabetes    pt is here for a diabetic nail trim. Pt is also currently on xarelto.   85 y.o. female returns for the above complaint.  Patient presents with thickened elongated mycotic toenails x10.  Patient says the painful when ambulating on.  Patient is currently on Xarelto.  Patient has tried self debridement but worried about bleeding if she accidentally cuts her skin.  She denies any other acute complaints.  She would like to have them debrided down.  Objective:  There were no vitals filed for this visit. Podiatric Exam: Vascular: dorsalis pedis and posterior tibial pulses are palpable bilateral. Capillary return is immediate. Temperature gradient is WNL. Skin turgor WNL  Sensorium: Normal Semmes Weinstein monofilament test. Normal tactile sensation bilaterally. Nail Exam: Pt has thick disfigured discolored nails with subungual debris noted bilateral entire nail hallux through fifth toenails Ulcer Exam: There is no evidence of ulcer or pre-ulcerative changes or infection. Orthopedic Exam: Muscle tone and strength are WNL. No limitations in general ROM. No crepitus or effusions noted. HAV  B/L.  Hammer toes 2-5  B/L. Skin: No Porokeratosis. No infection or ulcers  Assessment & Plan:  Patient was evaluated and treated and all questions answered.  Onychomycosis with pain  -Nails palliatively debrided as below. -Educated on self-care  Procedure: Nail Debridement Rationale: pain  Type of Debridement: manual, sharp debridement. Instrumentation: Nail nipper, rotary burr. Number of Nails: 10  Procedures and Treatment: Consent by patient was obtained for treatment procedures. The patient understood the discussion of treatment and procedures well. All questions were answered thoroughly reviewed. Debridement of mycotic and hypertrophic toenails, 1 through 5  bilateral and clearing of subungual debris. No ulceration, no infection noted.  Return Visit-Office Procedure: Patient instructed to return to the office for a follow up visit 3 months for continued evaluation and treatment.  Nicholes Rough, DPM    No follow-ups on file.

## 2019-06-04 ENCOUNTER — Ambulatory Visit: Payer: 59 | Attending: Family

## 2019-06-04 DIAGNOSIS — Z23 Encounter for immunization: Secondary | ICD-10-CM

## 2019-06-04 NOTE — Progress Notes (Signed)
   Covid-19 Vaccination Clinic  Name:  [name]    MRN: [id number] DOB: May 06, 1934  06/04/2019  [name] was observed post Covid-19 immunization for 15 minutes without incident. She was provided with Vaccine Information Sheet and instruction to access the V-Safe system.   [name] was instructed to call 911 with any severe reactions post vaccine: Marland Kitchen Difficulty breathing  . Swelling of face and throat  . A fast heartbeat  . A bad rash all over body  . Dizziness and weakness   Immunizations Administered    Name Date Dose VIS Date Route   JANSSEN COVID-19 VACCINE 06/04/2019  1:03 PM 0.5 mL 04/30/2019 Intramuscular   Manufacturer: [employer]   Lot: [id number]   NDC: 86754-492-01

## 2019-06-08 ENCOUNTER — Ambulatory Visit: Payer: 59 | Attending: Otolaryngology

## 2019-06-08 ENCOUNTER — Other Ambulatory Visit: Payer: Self-pay

## 2019-06-08 VITALS — BP 128/83 | HR 76

## 2019-06-08 DIAGNOSIS — M6281 Muscle weakness (generalized): Secondary | ICD-10-CM | POA: Insufficient documentation

## 2019-06-08 DIAGNOSIS — R262 Difficulty in walking, not elsewhere classified: Secondary | ICD-10-CM | POA: Diagnosis present

## 2019-06-08 DIAGNOSIS — R2681 Unsteadiness on feet: Secondary | ICD-10-CM | POA: Insufficient documentation

## 2019-06-08 NOTE — Therapy (Signed)
[place] [facility] MAIN [facility] SERVICES [address] [place], [place], [address] Phone: [phone]   Fax:  [phone]  Physical Therapy Evaluation  Patient Details  Name: [name] MRN: [id number] Date of Birth: 07/13/1934 Referring Provider (PT): Bud Face   Encounter Date: 06/08/2019  PT End of Session - 06/09/19 1035    Visit Number  1    Number of Visits  25    Date for PT Re-Evaluation  08/31/19    Authorization Type  eval: 06/08/19    PT Start Time  1545    PT Stop Time  1640    PT Time Calculation (min)  55 min    Equipment Utilized During Treatment  Gait belt    Activity Tolerance  Patient tolerated treatment well    Behavior During Therapy  [facility] for tasks assessed/performed       Past Medical History:  Diagnosis Date  . Allergy   . Atrial fibrillation (HCC) 2014   presumed as on xarelto after CVA 2014  . Bilateral primary osteoarthritis of knee    severe by xrays  . Carotid stenosis 10/25/2015   Mild by [place] 2014  . Diabetes mellitus without complication (HCC) 1994  . Heart disease   . Hepatitis    age 85  . HLD (hyperlipidemia)   . Hypertension   . Hypothyroidism   . Ischemic stroke (HCC) 2014   R basal ganglion infarct with L hemiparesis 2014, now largely resolved, uses cane  . Rheumatic fever   . Wears dentures    upper and lower    Past Surgical History:  Procedure Laterality Date  . CATARACT EXTRACTION W/PHACO Right 12/25/2015   Procedure: CATARACT EXTRACTION PHACO AND INTRAOCULAR LENS PLACEMENT (IOC);  Surgeon: Nevada Crane, MD;  Location: [facility];  Service: Ophthalmology;  Laterality: Right;  DIABETIC NEEDS INTERPRETER RIGHT  . COLONOSCOPY  2013   per patient WNL ([place])  . TOTAL ABDOMINAL HYSTERECTOMY W/ BILATERAL SALPINGOOPHORECTOMY  1972    Vitals:   06/08/19 1553  BP: 128/83  Pulse: 76  SpO2: 100%     Subjective Assessment - 06/08/19 1546    Subjective  Imbalance     Pertinent History  Pt referred for physical therapy for falls and poor balance. Daughter assists with history and reports worsening balance since her CVA 4 years ago. She was in [place] when she had her stroke and daughter reports that pt was not very fond of the therapy believing it was not very helpful. Daughter states that pt has had 3 falls in the last 6 months the last one that occurred on 05/12/19. She has struck her head during a couple falls with bruising noted. Pt is on blood thinners and there is a concern for repeated falls. She uses a wide base single point cane for ambulation in the house and a rollator when out in the community.    Limitations  Walking    Patient Stated Goals  Daughter would like to improve patients strength and balance    Currently in Pain?  Yes    Pain Score  --   Pt unable to rate   Pain Location  Knee    Pain Orientation  Right;Left    Pain Descriptors / Indicators  Aching    Pain Type  Chronic pain    Pain Onset  More than a month ago    Multiple Pain Sites  No  [facility] - 06/09/19 1030      Assessment   Medical Diagnosis  Dizziness and giddiness    Referring Provider (PT)  Vaught, Creighton    Onset Date/Surgical Date  06/09/15   Approximate   Hand Dominance  Right    Next MD Visit  Not reported    Prior Therapy  Yes, after CVA 4 years ago in [place]      Precautions   Precautions  Fall    Required Braces or Orthoses  Other Brace/Splint    Other Brace/Splint  Pt chooses to wear bilateral knee braces due to severe OA with pain      Restrictions   Weight Bearing Restrictions  No      Balance Screen   Has the patient fallen in the past 6 months  Yes    How many times?  3 falls in the last 12 months    Has the patient had a decrease in activity level because of a fear of falling?   No    Is the patient reluctant to leave their home because of a fear of falling?   No      Home Nurse, mental healthnvironment   Living Environment  Private  residence    Living Arrangements  Children    Available Help at Discharge  Family      Prior Function   Level of Independence  Needs assistance with homemaking;Needs assistance with gait      Cognition   Overall Cognitive Status  History of cognitive impairments - at baseline    Memory  Impaired    Memory Impairment  Decreased long term memory;Decreased short term memory      Berg Balance Test   Sit to Stand  Able to stand without using hands and stabilize independently    Standing Unsupported  Able to stand safely 2 minutes    Sitting with Back Unsupported but Feet Supported on Floor or Stool  Able to sit safely and securely 2 minutes    Stand to Sit  Controls descent by using hands    Transfers  Able to transfer safely, definite need of hands    Standing Unsupported with Eyes Closed  Able to stand 3 seconds    Standing Unsupported with Feet Together  Able to place feet together independently and stand for 1 minute with supervision    From Standing, Reach Forward with Outstretched Arm  Can reach forward >12 cm safely (5")    From Standing Position, Pick up Object from Floor  Able to pick up shoe, needs supervision    From Standing Position, Turn to Look Behind Over each Shoulder  Looks behind one side only/other side shows less weight shift    Turn 360 Degrees  Able to turn 360 degrees safely but slowly    Standing Unsupported, Alternately Place Feet on Step/Stool  Needs assistance to keep from falling or unable to try    Standing Unsupported, One Foot in Front  Able to take small step independently and hold 30 seconds    Standing on One Leg  Unable to try or needs assist to prevent fall    Total Score  36        TREATMENT   SUBJECTIVE Chief complaint: Imbalance Onset: Pt referred for physical therapy for falls and poor balance. Daughter assists with history and reports worsening balance since her CVA 4 years ago. She was in [place] when she had her stroke and daughter reports  that pt  was not very fond of the therapy believing it was not very helpful. Daughter states that pt has had 3 falls in the last 6 months the last one that occurred on 05/12/19. She has struck her head during a couple falls with bruising noted. Pt is on blood thinners and there is a concern for repeated falls. She uses a wide base single point cane for ambulation in the house and a rollator when out in the community.    Red flags (bowel/bladder changes, saddle paresthesia, personal history of cancer, chills/fever, night sweats, unrelenting pain) Negative  OBJECTIVE  MUSCULOSKELETAL: Tremor: Absent Bulk: Decrease in muscle tone Tone: Normal  Posture Forward head and rounded shoulders in sitting and standing. Forward trunk lean noted  Gait Decreased step length and gait speed with single point cane in RUE. No foot drop noted on either side;  Strength R/L 4+/4+ Hip flexion 5/5 Hip abduction 5/5 Hip adduction 4+/4+ Knee extension 4+/4+ Knee flexion 4+/4+ Ankle Dorsiflexion   NEUROLOGICAL:  Mental Status Recent memory is mildly impaired Remote memory is mildly impaired Attention span and concentration are intact.  Expressive speech is intact.  Patient's fund of knowledge is within normal limits for educational level.   Sensation Grossly intact to light touch bilateral UEs as determined by testing dermatomes C2-T2 respectively Proprioception and hot/cold testing deferred on this date  Reflexes Deferred  Coordination/Cerebellar Deferred  FUNCTIONAL OUTCOME MEASURES   Results Comments  BERG 36/56 Fall risk, in need of intervention  TUG 17.9 seconds Fall risk, in need of intervention  5TSTS 14.6 seconds Fall risk, in need of intervention  10 Meter Gait Speed Self-selected: 16.3s = 0.61 m/s; Fastest: 15.5s = 0.65 m/s Below normative values for full community ambulation  ABC Scale 0% Fall risk, in need of intervention  DHI 70/100 Fall risk, in need of intervention      POSTURAL CONTROL TESTS   Modified Clinical Test of Sensory Interaction for Balance    (CTSIB): Deferred      Objective measurements completed on examination: See above findings.       PT Education - 06/09/19 1035    Education Details  Plan of care    Person(s) Educated  Patient;Child(ren)    Methods  Explanation    Comprehension  Verbalized understanding       PT Short Term Goals - 06/09/19 1054      PT SHORT TERM GOAL #1   Title  Pt and daughter will be independent with HEP in order to improve strength and balance in order to decrease fall risk and improve function at home.    Time  6    Period  Weeks    Status  New    Target Date  07/21/19        PT Long Term Goals - 06/09/19 1055      PT LONG TERM GOAL #1   Title  Pt will improve BERG by at least 3 points in order to demonstrate clinically significant improvement in balance.    Baseline  06/08/19: 36/56    Time  12    Period  Weeks    Status  New    Target Date  08/31/19      PT LONG TERM GOAL #2   Title  Pt will increase FOTO to at least 54 in order to demonstrate clinically significant improvement in function at home    Baseline  06/08/19: 36    Time  12    Period  Weeks  Status  New    Target Date  08/31/19      PT LONG TERM GOAL #3   Title  Pt will decrease 5TSTS to below 12 seconds in order to demonstrate clinically significant improvement in LE strength.    Baseline  06/08/19: 14.6s    Time  12    Period  Weeks    Status  New    Target Date  08/31/19      PT LONG TERM GOAL #4   Title  Pt will decrease TUG to below 14 seconds/decrease in order to demonstrate decreased fall risk.    Baseline  06/08/19: 17.9s    Time  12    Period  Weeks    Status  New    Target Date  08/31/19      PT LONG TERM GOAL #5   Title  Pt will increase self-selected by at least 0.13 m/s in order to demonstrate clinically significant improvement in community ambulation.    Baseline  06/08/19: self-selected:  16.3s = 0.61 m/s    Time  12    Period  Weeks    Status  New    Target Date  08/31/19             Plan - 06/09/19 1037    Clinical Impression Statement  Pt is a pleasant 85 year-old female referred for difficulty with balance. She has had 3 falls in the last 6 months and given that she is on blood thinners there is concern for additional falls.  PT examination reveals deficits in LE strength and balance. TUG and 5TSTS are 17.9s and 14.6s respectively. BERG score of 36/56 places pt in a high fall risk category. Her 58m gait speed is below speed required for full community ambulation. Pt presents with deficits in strength, gait and balance. She will benefit from skilled PT services to address these deficits and decrease her risk for future falls.    Personal Factors and Comorbidities  Age;Comorbidity 3+;Past/Current Experience;Time since onset of injury/illness/exacerbation    Comorbidities  DM, CVA, MCI, OA    Examination-Activity Limitations  Caring for Others;Carry;Lift;Locomotion Level;Squat;Stairs;Stand;Transfers    Examination-Participation Restrictions  Cleaning;Community Activity;Laundry;Medication Management;Meal Prep;Shop    Stability/Clinical Decision Making  Unstable/Unpredictable    Clinical Decision Making  High    Rehab Potential  Fair    PT Frequency  2x / week    PT Duration  12 weeks    PT Treatment/Interventions  ADLs/Self Care Home Management;Aquatic Therapy;Biofeedback;Canalith Repostioning;Cryotherapy;Electrical Stimulation;Iontophoresis 4mg /ml Dexamethasone;Moist Heat;Traction;Ultrasound;DME Instruction;Gait training;Stair training;Functional mobility training;Therapeutic activities;Therapeutic exercise;Balance training;Neuromuscular re-education;Cognitive remediation;Patient/family education;Manual techniques;Passive range of motion;Dry needling;Taping;Vestibular;Spinal Manipulations;Joint Manipulations    PT Next Visit Plan  Initiate HEP, balance and strengthening     PT Home Exercise Plan  None currently    Consulted and Agree with Plan of Care  Patient       Patient will benefit from skilled therapeutic intervention in order to improve the following deficits and impairments:  Abnormal gait, Decreased balance, Decreased mobility, Decreased strength, Difficulty walking, Pain  Visit Diagnosis: Unsteadiness on feet  Difficulty in walking, not elsewhere classified  Muscle weakness (generalized)     Problem List Patient Active Problem List   Diagnosis Date Noted  . MCI (mild cognitive impairment) 05/13/2019  . Anemia 08/12/2017  . Weight loss 08/10/2017  . Health maintenance examination 01/26/2016  . Advanced care planning/counseling discussion 01/26/2016  . Insomnia 01/26/2016  . Iron deficiency 01/26/2016  . Carotid stenosis 10/25/2015  . Osteopenia  10/25/2015  . Primary osteoarthritis of both knees 10/24/2015  . Type 2 diabetes mellitus with complication, with long-term current use of insulin (HCC) 10/24/2015  . Corns and callus 10/24/2015  . Dyslipidemia   . Hypothyroidism   . History of ischemic stroke without residual deficits 03/03/2012  . Atrial fibrillation (HCC) 03/03/2012   Lynnea Maizes PT, DPT, GCS  Carolos Fecher 06/09/2019, 11:12 AM   [facility] MAIN [facility] SERVICES [address] [place], [place], [address] Phone: [phone]   Fax:  [phone]  Name: [name] MRN: [id number] Date of Birth: 1934/11/22

## 2019-06-13 ENCOUNTER — Ambulatory Visit: Payer: 59

## 2019-06-14 ENCOUNTER — Encounter: Payer: 59 | Admitting: Physical Therapy

## 2019-06-15 ENCOUNTER — Ambulatory Visit: Payer: 59

## 2019-06-15 ENCOUNTER — Other Ambulatory Visit: Payer: Self-pay

## 2019-06-15 DIAGNOSIS — R262 Difficulty in walking, not elsewhere classified: Secondary | ICD-10-CM

## 2019-06-15 DIAGNOSIS — M6281 Muscle weakness (generalized): Secondary | ICD-10-CM

## 2019-06-15 DIAGNOSIS — R2681 Unsteadiness on feet: Secondary | ICD-10-CM | POA: Diagnosis not present

## 2019-06-15 NOTE — Therapy (Signed)
[place] MAIN [facility] SERVICES [address] [place], [place], [address] Phone: [phone]   Fax:  [phone]  Physical Therapy Treatment  Patient Details  Name: [name] MRN: [id number] Date of Birth: 1934/08/07 Referring Provider (PT): Carloyn Manner   Encounter Date: 06/15/2019  PT End of Session - 06/15/19 1553    Visit Number  2    Number of Visits  25    Date for PT Re-Evaluation  08/31/19    Authorization Type  eval: 06/08/19    PT Start Time  1440    PT Stop Time  1520    PT Time Calculation (min)  40 min    Equipment Utilized During Treatment  Gait belt    Activity Tolerance  Patient tolerated treatment well    Behavior During Therapy  [facility] for tasks assessed/performed       Past Medical History:  Diagnosis Date  . Allergy   . Atrial fibrillation ([place]) 2014   presumed as on xarelto after CVA 2014  . Bilateral primary osteoarthritis of knee    severe by xrays  . Carotid stenosis 10/25/2015   Mild by [place] 2014  . Diabetes mellitus without complication ([place]) 2831  . Heart disease   . Hepatitis    age 46  . HLD (hyperlipidemia)   . Hypertension   . Hypothyroidism   . Ischemic stroke ([place]) 2014   R basal ganglion infarct with L hemiparesis 2014, now largely resolved, uses cane  . Rheumatic fever   . Wears dentures    upper and lower    Past Surgical History:  Procedure Laterality Date  . CATARACT EXTRACTION W/PHACO Right 12/25/2015   Procedure: CATARACT EXTRACTION PHACO AND INTRAOCULAR LENS PLACEMENT (IOC);  Surgeon: Eulogio Bear, MD;  Location: [facility];  Service: Ophthalmology;  Laterality: Right;  DIABETIC NEEDS INTERPRETER RIGHT  . COLONOSCOPY  2013   per patient WNL ([place])  . TOTAL ABDOMINAL HYSTERECTOMY W/ BILATERAL SALPINGOOPHORECTOMY  1972    There were no vitals filed for this visit.  Subjective Assessment - 06/15/19 1440    Subjective  Patient and daughter report she is doing well  today. No pain reported upon arrival. They saw neurology today who is ordering a brain MRI and labwork. Otherwise no specific questions or concerns.    Pertinent History  Pt referred for physical therapy for falls and poor balance. Daughter assists with history and reports worsening balance since her CVA 4 years ago. She was in [place] when she had her stroke and daughter reports that pt was not very fond of the therapy believing it was not very helpful. Daughter states that pt has had 3 falls in the last 6 months the last one that occurred on 05/12/19. She has struck her head during a couple falls with bruising noted. Pt is on blood thinners and there is a concern for repeated falls. She uses a wide base single point cane for ambulation in the house and a rollator when out in the community.    Limitations  Walking    Patient Stated Goals  Daughter would like to improve patients strength and balance    Currently in Pain?  No/denies    Pain Onset  --           TREATMENT   Ther-ex  Seated marches with red tband 2 x 10 bilateral; Seated clams with red tband 3s hold 2 x 10 bilateral; Seated adductor squeezes with ball between knees  3s hold 2 x 10 bilateral; Seated LAQ with light manual resistance from therapist 2 x 10 bilateral; Seated HS curls with red tband 2 x 10 bilateral; Mini squats BUE support x 10, cues to keep more weight on heels however unable to perform second set due to increase in bilateral knee pain; Standing hip flexion marches with BUE support x 10 bilateral; Standing hip abduction with BUE support x 10 bilateral; Standing hip extension with BUE support x 10 bilateral; HEP issued and reviewed with daughter;   Neuromuscular Re-education (all without UE support) NBOS eyes open/closed x 30s each; NBOS horizontal and vertical head turns x 30s each; Airex NBOS eyes open/closed x 30s each; Airex NBOS horizontal and vertical head turns x 30s each; Alternating 6" step taps x 10  bilateral; Semitandem balance alternating forward LE 30s x 2 each;   Pt educated throughout session about proper posture and technique with exercises. Improved exercise technique, movement at target joints, use of target muscles after min to mod verbal, visual, tactile cues.    Pt demonstrates excellent motivation during session. She does report some increase in knee pain during squats so after first set no further squats performed. She fatigues relatively easily so seated rest breaks provided throughout session. Pt issued HEP and explanation provided to daughter. Pt instructed to start performing exercises at home. Will add balance exercises to HEP at next visit. Pt will benefit from PT services to address deficits in strength, balance, and mobility in order to return to full function at home.                      PT Short Term Goals - 06/09/19 1054      PT SHORT TERM GOAL #1   Title  Pt and daughter will be independent with HEP in order to improve strength and balance in order to decrease fall risk and improve function at home.    Time  6    Period  Weeks    Status  New    Target Date  07/21/19        PT Long Term Goals - 06/09/19 1055      PT LONG TERM GOAL #1   Title  Pt will improve BERG by at least 3 points in order to demonstrate clinically significant improvement in balance.    Baseline  06/08/19: 36/56    Time  12    Period  Weeks    Status  New    Target Date  08/31/19      PT LONG TERM GOAL #2   Title  Pt will increase FOTO to at least 54 in order to demonstrate clinically significant improvement in function at home    Baseline  06/08/19: 36    Time  12    Period  Weeks    Status  New    Target Date  08/31/19      PT LONG TERM GOAL #3   Title  Pt will decrease 5TSTS to below 12 seconds in order to demonstrate clinically significant improvement in LE strength.    Baseline  06/08/19: 14.6s    Time  12    Period  Weeks    Status  New    Target Date   08/31/19      PT LONG TERM GOAL #4   Title  Pt will decrease TUG to below 14 seconds/decrease in order to demonstrate decreased fall risk.    Baseline  06/08/19: 17.9s  Time  12    Period  Weeks    Status  New    Target Date  08/31/19      PT LONG TERM GOAL #5   Title  Pt will increase self-selected by at least 0.13 m/s in order to demonstrate clinically significant improvement in community ambulation.    Baseline  06/08/19: self-selected: 16.3s = 0.61 m/s    Time  12    Period  Weeks    Status  New    Target Date  08/31/19            Plan - 06/15/19 1442    Clinical Impression Statement  Pt demonstrates excellent motivation during session. She does report some increase in knee pain during squats so after first set no further squats performed. She fatigues relatively easily so seated rest breaks provided throughout session. Pt issued HEP and explanation provided to daughter. Pt instructed to start performing exercises at home. Will add balance exercises to HEP at next visit. Pt will benefit from PT services to address deficits in strength, balance, and mobility in order to return to full function at home.    Personal Factors and Comorbidities  Age;Comorbidity 3+;Past/Current Experience;Time since onset of injury/illness/exacerbation    Comorbidities  DM, CVA, MCI, OA    Examination-Activity Limitations  Caring for Others;Carry;Lift;Locomotion Level;Squat;Stairs;Stand;Transfers    Examination-Participation Restrictions  Cleaning;Community Activity;Laundry;Medication Management;Meal Prep;Shop    Stability/Clinical Decision Making  Unstable/Unpredictable    Rehab Potential  Fair    PT Frequency  2x / week    PT Duration  12 weeks    PT Treatment/Interventions  ADLs/Self Care Home Management;Aquatic Therapy;Biofeedback;Canalith Repostioning;Cryotherapy;Electrical Stimulation;Iontophoresis 4mg /ml Dexamethasone;Moist Heat;Traction;Ultrasound;DME Instruction;Gait training;Stair  training;Functional mobility training;Therapeutic activities;Therapeutic exercise;Balance training;Neuromuscular re-education;Cognitive remediation;Patient/family education;Manual techniques;Passive range of motion;Dry needling;Taping;Vestibular;Spinal Manipulations;Joint Manipulations    PT Next Visit Plan  Initiate HEP, balance and strengthening    PT Home Exercise Plan  Medbridge Access Code: [email or web address]    Consulted and Agree with Plan of Care  Patient       Patient will benefit from skilled therapeutic intervention in order to improve the following deficits and impairments:  Abnormal gait, Decreased balance, Decreased mobility, Decreased strength, Difficulty walking, Pain  Visit Diagnosis: Unsteadiness on feet  Difficulty in walking, not elsewhere classified  Muscle weakness (generalized)     Problem List Patient Active Problem List   Diagnosis Date Noted  . MCI (mild cognitive impairment) 05/13/2019  . Anemia 08/12/2017  . Weight loss 08/10/2017  . Health maintenance examination 01/26/2016  . Advanced care planning/counseling discussion 01/26/2016  . Insomnia 01/26/2016  . Iron deficiency 01/26/2016  . Carotid stenosis 10/25/2015  . Osteopenia 10/25/2015  . Primary osteoarthritis of both knees 10/24/2015  . Type 2 diabetes mellitus with complication, with long-term current use of insulin (HCC) 10/24/2015  . Corns and callus 10/24/2015  . Dyslipidemia   . Hypothyroidism   . History of ischemic stroke without residual deficits 03/03/2012  . Atrial fibrillation (HCC) 03/03/2012   05/01/2012 PT, DPT, GCS  Ebin Palazzi 06/15/2019, 3:57 PM  [place] [facility] MAIN [facility] SERVICES [address] [place], [place], [place] Phone: [phone]   Fax:  [phone]  Name: [name] MRN: Domingo Pulse Date of Birth: 10-12-1934

## 2019-06-17 ENCOUNTER — Other Ambulatory Visit: Payer: Self-pay | Admitting: Neurology

## 2019-06-17 DIAGNOSIS — G3184 Mild cognitive impairment, so stated: Secondary | ICD-10-CM

## 2019-06-20 ENCOUNTER — Other Ambulatory Visit: Payer: Self-pay

## 2019-06-20 ENCOUNTER — Ambulatory Visit: Payer: 59

## 2019-06-20 DIAGNOSIS — R2681 Unsteadiness on feet: Secondary | ICD-10-CM

## 2019-06-20 DIAGNOSIS — R262 Difficulty in walking, not elsewhere classified: Secondary | ICD-10-CM

## 2019-06-20 DIAGNOSIS — M6281 Muscle weakness (generalized): Secondary | ICD-10-CM

## 2019-06-20 NOTE — Therapy (Signed)
[facility] MAIN [facility] SERVICES [address] [place], [place], [address] Phone: [phone]   Fax:  [phone]  Physical Therapy Treatment  Patient Details  Name: [name] MRN: [id number] Date of Birth: [birth year] Referring Provider (PT): Bud Face   Encounter Date: 06/20/2019  PT End of Session - 06/20/19 1743    Visit Number  3    Number of Visits  25    Date for PT Re-Evaluation  08/31/19    Authorization Type  eval: 06/08/19    PT Start Time  1646    PT Stop Time  1730    PT Time Calculation (min)  44 min    Equipment Utilized During Treatment  Gait belt    Activity Tolerance  Patient tolerated treatment well    Behavior During Therapy  [facility] for tasks assessed/performed       Past Medical History:  Diagnosis Date  . Allergy   . Atrial fibrillation (HCC) 2014   presumed as on xarelto after CVA 2014  . Bilateral primary osteoarthritis of knee    severe by xrays  . Carotid stenosis 10/25/2015   Mild by [place] 2014  . Diabetes mellitus without complication (HCC) 1994  . Heart disease   . Hepatitis    age 49  . HLD (hyperlipidemia)   . Hypertension   . Hypothyroidism   . Ischemic stroke (HCC) 2014   R basal ganglion infarct with L hemiparesis 2014, now largely resolved, uses cane  . Rheumatic fever   . Wears dentures    upper and lower    Past Surgical History:  Procedure Laterality Date  . CATARACT EXTRACTION W/PHACO Right 12/25/2015   Procedure: CATARACT EXTRACTION PHACO AND INTRAOCULAR LENS PLACEMENT (IOC);  Surgeon: Nevada Crane, MD;  Location: [facility];  Service: Ophthalmology;  Laterality: Right;  DIABETIC NEEDS INTERPRETER RIGHT  . COLONOSCOPY  2013   per patient WNL ([place])  . TOTAL ABDOMINAL HYSTERECTOMY W/ BILATERAL SALPINGOOPHORECTOMY  1972    There were no vitals filed for this visit.  Subjective Assessment - 06/20/19 1741    Subjective  Patient presents with daughter. Declined use of  interpreter. No pain at time of arrival, reports she did the bike earlier today.    Pertinent History  Pt referred for physical therapy for falls and poor balance. Daughter assists with history and reports worsening balance since her CVA 4 years ago. She was in [place] when she had her stroke and daughter reports that pt was not very fond of the therapy believing it was not very helpful. Daughter states that pt has had 3 falls in the last 6 months the last one that occurred on 05/12/19. She has struck her head during a couple falls with bruising noted. Pt is on blood thinners and there is a concern for repeated falls. She uses a wide base single point cane for ambulation in the house and a rollator when out in the community.    Limitations  Walking    Patient Stated Goals  Daughter would like to improve patients strength and balance    Currently in Pain?  No/denies             TREATMENT  Nustep Lvl 2-3 RPM>60 for cardiovascular challenge 3 minutes    Ther-ex: mod to max cueing via visual, verbal, and tactile for proper body mechanics and sequencing for optimal muscle recruitment Standing in // bars:   2.5 ankle weight: side stepping 4x length of //  bars SUE support 2.5 ankle weight: forward marching in // bars 2x length; SUE support  Seated: 2.5 ankle weight LAQ 12x each LE, cueing for decreased velocity 2.5 ankle weight marching alternating 10x, each LE, cues for upright posture 2.5 ankle weight ER/IR cues for keeping knees together 10x each LE  RTB adduction 15x each LE, one LE at a time RTB abduction 15x bilateral LE: both LE's at the same time RTB hamstring curl 12x each LE, very challenging LLE.  Soccer ball between feet, LAQ with adduction to keep ball in place 8x, very challenging for patient.    Neuromuscular Re-education close CGA with max cueing via visual and verbal   airex pad: one foot on 4" step for semi tandem positioning no UE support 2x 30 seconds each LE placement,  very challenging for patient, frequently wanting to use UE's for stabilization airex pad: rainbow ball raises straight arm 10x, chest press 10x Balloon taps inside/outside BOS x 3 minutes for reaching stabilization, pertubation's, and reactions.  Speed ladder: one foot in each square SUE support 6x length of // bars for increased step length and gait mechanics    Seated soccer ball kicks with goal for coordination, sequencing, reaction timing, and muscle activation x 5 minutes   Pt educated throughout session about proper posture and technique with exercises. Improved exercise technique, movement at target joints, use of target muscles after min to mod verbal, visual, tactile cues.                    PT Education - 06/20/19 1743    Education Details  exercise technique, body mechanics    Person(s) Educated  Patient    Methods  Explanation;Demonstration;Tactile cues;Verbal cues    Comprehension  Verbalized understanding;Returned demonstration;Verbal cues required;Tactile cues required       PT Short Term Goals - 06/09/19 1054      PT SHORT TERM GOAL #1   Title  Pt and daughter will be independent with HEP in order to improve strength and balance in order to decrease fall risk and improve function at home.    Time  6    Period  Weeks    Status  New    Target Date  07/21/19        PT Long Term Goals - 06/09/19 1055      PT LONG TERM GOAL #1   Title  Pt will improve BERG by at least 3 points in order to demonstrate clinically significant improvement in balance.    Baseline  06/08/19: 36/56    Time  12    Period  Weeks    Status  New    Target Date  08/31/19      PT LONG TERM GOAL #2   Title  Pt will increase FOTO to at least 54 in order to demonstrate clinically significant improvement in function at home    Baseline  06/08/19: 36    Time  12    Period  Weeks    Status  New    Target Date  08/31/19      PT LONG TERM GOAL #3   Title  Pt will decrease 5TSTS to  below 12 seconds in order to demonstrate clinically significant improvement in LE strength.    Baseline  06/08/19: 14.6s    Time  12    Period  Weeks    Status  New    Target Date  08/31/19      PT  LONG TERM GOAL #4   Title  Pt will decrease TUG to below 14 seconds/decrease in order to demonstrate decreased fall risk.    Baseline  06/08/19: 17.9s    Time  12    Period  Weeks    Status  New    Target Date  08/31/19      PT LONG TERM GOAL #5   Title  Pt will increase self-selected by at least 0.13 m/s in order to demonstrate clinically significant improvement in community ambulation.    Baseline  06/08/19: self-selected: 16.3s = 0.61 m/s    Time  12    Period  Weeks    Status  New    Target Date  08/31/19            Plan - 06/20/19 1743    Clinical Impression Statement  Patient required max visual cueing throughout session due to limited english language understanding of patient and limited spanish of this author. Daughter assisted occasionally in treatment session however declined use of a interpreter. Patient is limited in squat position due to knee pain however does tolerate unstable surfaces well. Pt will benefit from PT services to address deficits in strength, balance, and mobility in order to return to full function at home    Personal Factors and Comorbidities  Age;Comorbidity 3+;Past/Current Experience;Time since onset of injury/illness/exacerbation    Comorbidities  DM, CVA, MCI, OA    Examination-Activity Limitations  Caring for Others;Carry;Lift;Locomotion Level;Squat;Stairs;Stand;Transfers    Examination-Participation Restrictions  Cleaning;Community Activity;Laundry;Medication Management;Meal Prep;Shop    Stability/Clinical Decision Making  Unstable/Unpredictable    Rehab Potential  Fair    PT Frequency  2x / week    PT Duration  12 weeks    PT Treatment/Interventions  ADLs/Self Care Home Management;Aquatic Therapy;Biofeedback;Canalith  Repostioning;Cryotherapy;Electrical Stimulation;Iontophoresis 4mg /ml Dexamethasone;Moist Heat;Traction;Ultrasound;DME Instruction;Gait training;Stair training;Functional mobility training;Therapeutic activities;Therapeutic exercise;Balance training;Neuromuscular re-education;Cognitive remediation;Patient/family education;Manual techniques;Passive range of motion;Dry needling;Taping;Vestibular;Spinal Manipulations;Joint Manipulations    PT Next Visit Plan  Initiate HEP, balance and strengthening    PT Home Exercise Plan  Medbridge Access Code: [email or web address]    Consulted and Agree with Plan of Care  Patient       Patient will benefit from skilled therapeutic intervention in order to improve the following deficits and impairments:  Abnormal gait, Decreased balance, Decreased mobility, Decreased strength, Difficulty walking, Pain  Visit Diagnosis: Unsteadiness on feet  Difficulty in walking, not elsewhere classified  Muscle weakness (generalized)     Problem List Patient Active Problem List   Diagnosis Date Noted  . MCI (mild cognitive impairment) 05/13/2019  . Anemia 08/12/2017  . Weight loss 08/10/2017  . Health maintenance examination 01/26/2016  . Advanced care planning/counseling discussion 01/26/2016  . Insomnia 01/26/2016  . Iron deficiency 01/26/2016  . Carotid stenosis 10/25/2015  . Osteopenia 10/25/2015  . Primary osteoarthritis of both knees 10/24/2015  . Type 2 diabetes mellitus with complication, with long-term current use of insulin (HCC) 10/24/2015  . Corns and callus 10/24/2015  . Dyslipidemia   . Hypothyroidism   . History of ischemic stroke without residual deficits 03/03/2012  . Atrial fibrillation (HCC) 03/03/2012   05/01/2012, PT, DPT   06/20/2019, 5:44 PM  [place] [facility] MAIN [facility] SERVICES [address] [place], [place], [place] Phone: [phone]   Fax:  [phone]  Name: [name] MRN: Domingo Pulse Date of  Birth: 1934/03/14

## 2019-06-22 ENCOUNTER — Other Ambulatory Visit: Payer: Self-pay

## 2019-06-22 ENCOUNTER — Ambulatory Visit: Payer: 59

## 2019-06-22 DIAGNOSIS — R2681 Unsteadiness on feet: Secondary | ICD-10-CM | POA: Diagnosis not present

## 2019-06-22 DIAGNOSIS — R262 Difficulty in walking, not elsewhere classified: Secondary | ICD-10-CM

## 2019-06-22 DIAGNOSIS — M6281 Muscle weakness (generalized): Secondary | ICD-10-CM

## 2019-06-22 NOTE — Therapy (Signed)
[place] MAIN [facility] SERVICES [address] [place], [place], [address] Phone: [phone]   Fax:  [phone]  Physical Therapy Treatment  Patient Details  Name: [name] MRN: [id number] Date of Birth: Oct 30, 1934 Referring Provider (PT): Carloyn Manner   Encounter Date: 06/22/2019  PT End of Session - 06/22/19 1439    Visit Number  4    Number of Visits  25    Date for PT Re-Evaluation  08/31/19    Authorization Type  eval: 06/08/19    PT Start Time  1435    PT Stop Time  1515    PT Time Calculation (min)  40 min    Equipment Utilized During Treatment  Gait belt    Activity Tolerance  Patient tolerated treatment well    Behavior During Therapy  [facility] for tasks assessed/performed       Past Medical History:  Diagnosis Date  . Allergy   . Atrial fibrillation ([place]) 2014   presumed as on xarelto after CVA 2014  . Bilateral primary osteoarthritis of knee    severe by xrays  . Carotid stenosis 10/25/2015   Mild by [place] 2014  . Diabetes mellitus without complication ([place]) 8786  . Heart disease   . Hepatitis    age 45  . HLD (hyperlipidemia)   . Hypertension   . Hypothyroidism   . Ischemic stroke ([place]) 2014   R basal ganglion infarct with L hemiparesis 2014, now largely resolved, uses cane  . Rheumatic fever   . Wears dentures    upper and lower    Past Surgical History:  Procedure Laterality Date  . CATARACT EXTRACTION W/PHACO Right 12/25/2015   Procedure: CATARACT EXTRACTION PHACO AND INTRAOCULAR LENS PLACEMENT (IOC);  Surgeon: Eulogio [facility], MD;  Location: [facility];  Service: Ophthalmology;  Laterality: Right;  DIABETIC NEEDS INTERPRETER RIGHT  . COLONOSCOPY  2013   per patient WNL ([place])  . TOTAL ABDOMINAL HYSTERECTOMY W/ BILATERAL SALPINGOOPHORECTOMY  1972    There were no vitals filed for this visit.  Subjective Assessment - 06/22/19 1438    Subjective  Patient presents with daughter. Declined use of  interpreter. No pain at time of arrival, reports she did exercise with the resistance bands earlier today. Continues with chronic BLE knee pain with activity but not at rest. No specific questions or concerns currently.    Pertinent History  Pt referred for physical therapy for falls and poor balance. Daughter assists with history and reports worsening balance since her CVA 4 years ago. She was in [place] when she had her stroke and daughter reports that pt was not very fond of the therapy believing it was not very helpful. Daughter states that pt has had 3 falls in the last 6 months the last one that occurred on 05/12/19. She has struck her head during a couple falls with bruising noted. Pt is on blood thinners and there is a concern for repeated falls. She uses a wide base single point cane for ambulation in the house and a rollator when out in the community.    Limitations  Walking    Patient Stated Goals  Daughter would like to improve patients strength and balance    Currently in Pain?  No/denies           TREATMENT   Ther-ex  Seated marches with red tband x 20 bilateral; Seated clams with red tband 3s hold x 20 bilateral; Seated adductor squeezes with ball between  knees 3s hold x 20 bilateral; Seated LAQ with 2.5# ankle weights (AW) x 20 bilateral; Seated HS curls with red tband x 20 bilateral; Standing hip flexion marches with 2.5# AW and BUE support x 15 bilateral; Standing hip abduction with 2.5# AW and BUE support x 15 bilateral; Standing hip extension with 2.5# AW and BUE support x 15 bilateral;   Neuromuscular Re-education (all without UE support) Airex WBOS vertical ball lifts with head/eye follow x 30s; Airex WBOS eyes open/closed x 30s each; Airex WBOS ball passes around body with therapist return catch varying height of ball between waist and overhead and also varying distance away from body x multiple bouts on each side; Airex alternating 6" step taps with 2.5# AW x  10 bilateral; Staggered stance balance with rear foot on Airex and forward foot on 6" step alternating LE position 2 x 30s each;   Pt educated throughout session about proper posture and technique with exercises. Improved exercise technique, movement at target joints, use of target muscles after min to mod verbal, visual, tactile cues.    Pt demonstrates excellent motivation during session. Most of exercises performed in sitting due to increase in knee pain in standing. She fatigues relatively easily so seated rest breaks provided throughout session. Added ankle weights to exercises today to increase challenge. Continued with balance exercises on Airex pad today. Pt will benefit from PT services to address deficits in strength, balance, and mobility in order to return to full function at home.                        PT Short Term Goals - 06/09/19 1054      PT SHORT TERM GOAL #1   Title  Pt and daughter will be independent with HEP in order to improve strength and balance in order to decrease fall risk and improve function at home.    Time  6    Period  Weeks    Status  New    Target Date  07/21/19        PT Long Term Goals - 06/09/19 1055      PT LONG TERM GOAL #1   Title  Pt will improve BERG by at least 3 points in order to demonstrate clinically significant improvement in balance.    Baseline  06/08/19: 36/56    Time  12    Period  Weeks    Status  New    Target Date  08/31/19      PT LONG TERM GOAL #2   Title  Pt will increase FOTO to at least 54 in order to demonstrate clinically significant improvement in function at home    Baseline  06/08/19: 36    Time  12    Period  Weeks    Status  New    Target Date  08/31/19      PT LONG TERM GOAL #3   Title  Pt will decrease 5TSTS to below 12 seconds in order to demonstrate clinically significant improvement in LE strength.    Baseline  06/08/19: 14.6s    Time  12    Period  Weeks    Status  New    Target  Date  08/31/19      PT LONG TERM GOAL #4   Title  Pt will decrease TUG to below 14 seconds/decrease in order to demonstrate decreased fall risk.    Baseline  06/08/19: 17.9s    Time  12    Period  Weeks    Status  New    Target Date  08/31/19      PT LONG TERM GOAL #5   Title  Pt will increase self-selected by at least 0.13 m/s in order to demonstrate clinically significant improvement in community ambulation.    Baseline  06/08/19: self-selected: 16.3s = 0.61 m/s    Time  12    Period  Weeks    Status  New    Target Date  08/31/19            Plan - 06/22/19 1439    Clinical Impression Statement  Pt demonstrates excellent motivation during session. Most of exercises performed in sitting due to increase in knee pain in standing. She fatigues relatively easily so seated rest breaks provided throughout session. Added ankle weights to exercises today to increase challenge. Continued with balance exercises on Airex pad today. Pt will benefit from PT services to address deficits in strength, balance, and mobility in order to return to full function at home.    Personal Factors and Comorbidities  Age;Comorbidity 3+;Past/Current Experience;Time since onset of injury/illness/exacerbation    Comorbidities  DM, CVA, MCI, OA    Examination-Activity Limitations  Caring for Others;Carry;Lift;Locomotion Level;Squat;Stairs;Stand;Transfers    Examination-Participation Restrictions  Cleaning;Community Activity;Laundry;Medication Management;Meal Prep;Shop    Stability/Clinical Decision Making  Unstable/Unpredictable    Rehab Potential  Fair    PT Frequency  2x / week    PT Duration  12 weeks    PT Treatment/Interventions  ADLs/Self Care Home Management;Aquatic Therapy;Biofeedback;Canalith Repostioning;Cryotherapy;Electrical Stimulation;Iontophoresis 4mg /ml Dexamethasone;Moist Heat;Traction;Ultrasound;DME Instruction;Gait training;Stair training;Functional mobility training;Therapeutic  activities;Therapeutic exercise;Balance training;Neuromuscular re-education;Cognitive remediation;Patient/family education;Manual techniques;Passive range of motion;Dry needling;Taping;Vestibular;Spinal Manipulations;Joint Manipulations    PT Next Visit Plan  Balance and strengthening    PT Home Exercise Plan  Medbridge Access Code: [email or web address]    Consulted and Agree with Plan of Care  Patient       Patient will benefit from skilled therapeutic intervention in order to improve the following deficits and impairments:  Abnormal gait, Decreased balance, Decreased mobility, Decreased strength, Difficulty walking, Pain  Visit Diagnosis: Unsteadiness on feet  Difficulty in walking, not elsewhere classified  Muscle weakness (generalized)     Problem List Patient Active Problem List   Diagnosis Date Noted  . MCI (mild cognitive impairment) 05/13/2019  . Anemia 08/12/2017  . Weight loss 08/10/2017  . Health maintenance examination 01/26/2016  . Advanced care planning/counseling discussion 01/26/2016  . Insomnia 01/26/2016  . Iron deficiency 01/26/2016  . Carotid stenosis 10/25/2015  . Osteopenia 10/25/2015  . Primary osteoarthritis of both knees 10/24/2015  . Type 2 diabetes mellitus with complication, with long-term current use of insulin (HCC) 10/24/2015  . Corns and callus 10/24/2015  . Dyslipidemia   . Hypothyroidism   . History of ischemic stroke without residual deficits 03/03/2012  . Atrial fibrillation (HCC) 03/03/2012   05/01/2012 PT, DPT, [facility]  Dani Danis 06/22/2019, 4:03 PM  [place] [facility] MAIN [facility] SERVICES [address] [place], [place], [place] Phone: [phone]   Fax:  [phone]  Name: Myeisha Kruser MRN: Domingo Pulse Date of Birth: 1934/09/20

## 2019-06-27 ENCOUNTER — Other Ambulatory Visit: Payer: Self-pay

## 2019-06-27 ENCOUNTER — Ambulatory Visit: Payer: 59

## 2019-06-27 DIAGNOSIS — R262 Difficulty in walking, not elsewhere classified: Secondary | ICD-10-CM

## 2019-06-27 DIAGNOSIS — M6281 Muscle weakness (generalized): Secondary | ICD-10-CM

## 2019-06-27 DIAGNOSIS — R2681 Unsteadiness on feet: Secondary | ICD-10-CM

## 2019-06-27 NOTE — Therapy (Signed)
[place] MAIN [facility] SERVICES [address] [place], [place], [address] Phone: [phone]   Fax:  [phone]  Physical Therapy Treatment  Patient Details  Name: [name] MRN: [id number] Date of Birth: Jun 05, 1934 Referring Provider (PT): Carloyn Manner   Encounter Date: 06/27/2019  PT End of Session - 06/27/19 1606    Visit Number  5    Number of Visits  25    Date for PT Re-Evaluation  08/31/19    Authorization Type  eval: 06/08/19    PT Start Time  1600    PT Stop Time  1644    PT Time Calculation (min)  44 min    Equipment Utilized During Treatment  Gait belt    Activity Tolerance  Patient tolerated treatment well    Behavior During Therapy  [facility] for tasks assessed/performed       Past Medical History:  Diagnosis Date  . Allergy   . Atrial fibrillation ([place]) 2014   presumed as on xarelto after CVA 2014  . Bilateral primary osteoarthritis of knee    severe by xrays  . Carotid stenosis 10/25/2015   Mild by [place] 2014  . Diabetes mellitus without complication ([place]) 1696  . Heart disease   . Hepatitis    age 16  . HLD (hyperlipidemia)   . Hypertension   . Hypothyroidism   . Ischemic stroke ([place]) 2014   R basal ganglion infarct with L hemiparesis 2014, now largely resolved, uses cane  . Rheumatic fever   . Wears dentures    upper and lower    Past Surgical History:  Procedure Laterality Date  . CATARACT EXTRACTION W/PHACO Right 12/25/2015   Procedure: CATARACT EXTRACTION PHACO AND INTRAOCULAR LENS PLACEMENT (IOC);  Surgeon: Eulogio Bear, MD;  Location: [facility];  Service: Ophthalmology;  Laterality: Right;  DIABETIC NEEDS INTERPRETER RIGHT  . COLONOSCOPY  2013   per patient WNL ([place])  . TOTAL ABDOMINAL HYSTERECTOMY W/ BILATERAL SALPINGOOPHORECTOMY  1972    There were no vitals filed for this visit.  Subjective Assessment - 06/27/19 1605    Subjective  Patient presents with daughter, declines use of  interpreter. Reports some bilateral knee pain due to riding bike for 30 minutes this morning. Daughter reports some confusion lately.    Pertinent History  Pt referred for physical therapy for falls and poor balance. Daughter assists with history and reports worsening balance since her CVA 4 years ago. She was in [place] when she had her stroke and daughter reports that pt was not very fond of the therapy believing it was not very helpful. Daughter states that pt has had 3 falls in the last 6 months the last one that occurred on 05/12/19. She has struck her head during a couple falls with bruising noted. Pt is on blood thinners and there is a concern for repeated falls. She uses a wide base single point cane for ambulation in the house and a rollator when out in the community.    Limitations  Walking    Patient Stated Goals  Daughter would like to improve patients strength and balance    Currently in Pain?  Yes    Pain Score  --   unable to give number   Pain Location  Knee    Pain Orientation  Right;Left    Pain Descriptors / Indicators  Aching    Pain Type  Chronic pain    Pain Onset  More than a month ago  Pain Frequency  Constant           TREATMENT  Nustep Lvl 3 Rpm> 60 for 3 minutes for cardiovascular support    Ther-ex  Seated marches with red tband x 20 bilateral; cues for upright posture  Seated LAQ with 2.5# ankle weights (AW) x 15 one LE at a time,;painful, improved with taking off knee braces Standing hip flexion marches 4x length of // bars with 2.5# AW and BUE support ; Standing hip abduction with 2.5# AW and BUE support x 15 bilateral; Standing hip extension with 2.5# AW and BUE support x 15 bilateral;  roller to L calf x3 minutes for reduction of knee pain.  Cross body stepping x 10 each LE, BUE support, very challenging for patient to follow.   Heel toe raises 15x with BUE support   Neuromuscular Re-education (all without UE support) Airex static stand tossing  basketballs into hoop 20x, no LOB, occasional use of SUE support  Staggered stance balance with rear foot on Airex and forward foot on 6" step alternating LE position 2 x 30s each;  airex pad 6" step lateral toe taps 20x, very challenged with step ups so decreased to toe taps for decreased pain.    Pt educated throughout session about proper posture and technique with exercises. Improved exercise technique, movement at target joints, use of target muscles after min to mod verbal, visual, tactile cues.    Patient presents with occasional bilateral knee pain limiting capacity for prolonged standing interventions. Pain was reduced with use of roller to posterior calf of LLE with multiple trigger points noted. Cross body interventions are challenging to patient as well as dual task interventions due to cognitive component and language barrier. Pt will benefit from PT services to address deficits in strength, balance, and mobility in order to return to full function at home.                  PT Education - 06/27/19 1606    Education Details  exercise technique, body mechanics    Person(s) Educated  Patient;Child(ren)    Methods  Explanation;Demonstration;Tactile cues;Verbal cues    Comprehension  Verbalized understanding;Returned demonstration;Verbal cues required;Tactile cues required       PT Short Term Goals - 06/09/19 1054      PT SHORT TERM GOAL #1   Title  Pt and daughter will be independent with HEP in order to improve strength and balance in order to decrease fall risk and improve function at home.    Time  6    Period  Weeks    Status  New    Target Date  07/21/19        PT Long Term Goals - 06/09/19 1055      PT LONG TERM GOAL #1   Title  Pt will improve BERG by at least 3 points in order to demonstrate clinically significant improvement in balance.    Baseline  06/08/19: 36/56    Time  12    Period  Weeks    Status  New    Target Date  08/31/19      PT LONG  TERM GOAL #2   Title  Pt will increase FOTO to at least 54 in order to demonstrate clinically significant improvement in function at home    Baseline  06/08/19: 36    Time  12    Period  Weeks    Status  New    Target Date  08/31/19  PT LONG TERM GOAL #3   Title  Pt will decrease 5TSTS to below 12 seconds in order to demonstrate clinically significant improvement in LE strength.    Baseline  06/08/19: 14.6s    Time  12    Period  Weeks    Status  New    Target Date  08/31/19      PT LONG TERM GOAL #4   Title  Pt will decrease TUG to below 14 seconds/decrease in order to demonstrate decreased fall risk.    Baseline  06/08/19: 17.9s    Time  12    Period  Weeks    Status  New    Target Date  08/31/19      PT LONG TERM GOAL #5   Title  Pt will increase self-selected by at least 0.13 m/s in order to demonstrate clinically significant improvement in community ambulation.    Baseline  06/08/19: self-selected: 16.3s = 0.61 m/s    Time  12    Period  Weeks    Status  New    Target Date  08/31/19            Plan - 06/27/19 1649    Clinical Impression Statement  Patient presents with occasional bilateral knee pain limiting capacity for prolonged standing interventions. Pain was reduced with use of roller to posterior calf of LLE with multiple trigger points noted. Cross body interventions are challenging to patient as well as dual task interventions due to cognitive component and language barrier. Pt will benefit from PT services to address deficits in strength, balance, and mobility in order to return to full function at home.    Personal Factors and Comorbidities  Age;Comorbidity 3+;Past/Current Experience;Time since onset of injury/illness/exacerbation    Comorbidities  DM, CVA, MCI, OA    Examination-Activity Limitations  Caring for Others;Carry;Lift;Locomotion Level;Squat;Stairs;Stand;Transfers    Examination-Participation Restrictions  Cleaning;Community  Activity;Laundry;Medication Management;Meal Prep;Shop    Stability/Clinical Decision Making  Unstable/Unpredictable    Rehab Potential  Fair    PT Frequency  2x / week    PT Duration  12 weeks    PT Treatment/Interventions  ADLs/Self Care Home Management;Aquatic Therapy;Biofeedback;Canalith Repostioning;Cryotherapy;Electrical Stimulation;Iontophoresis 4mg /ml Dexamethasone;Moist Heat;Traction;Ultrasound;DME Instruction;Gait training;Stair training;Functional mobility training;Therapeutic activities;Therapeutic exercise;Balance training;Neuromuscular re-education;Cognitive remediation;Patient/family education;Manual techniques;Passive range of motion;Dry needling;Taping;Vestibular;Spinal Manipulations;Joint Manipulations    PT Next Visit Plan  Balance and strengthening    PT Home Exercise Plan  Medbridge Access Code: [email or web address]    Consulted and Agree with Plan of Care  Patient       Patient will benefit from skilled therapeutic intervention in order to improve the following deficits and impairments:  Abnormal gait, Decreased balance, Decreased mobility, Decreased strength, Difficulty walking, Pain  Visit Diagnosis: Unsteadiness on feet  Difficulty in walking, not elsewhere classified  Muscle weakness (generalized)     Problem List Patient Active Problem List   Diagnosis Date Noted  . MCI (mild cognitive impairment) 05/13/2019  . Anemia 08/12/2017  . Weight loss 08/10/2017  . Health maintenance examination 01/26/2016  . Advanced care planning/counseling discussion 01/26/2016  . Insomnia 01/26/2016  . Iron deficiency 01/26/2016  . Carotid stenosis 10/25/2015  . Osteopenia 10/25/2015  . Primary osteoarthritis of both knees 10/24/2015  . Type 2 diabetes mellitus with complication, with long-term current use of insulin (HCC) 10/24/2015  . Corns and callus 10/24/2015  . Dyslipidemia   . Hypothyroidism   . History of ischemic stroke without residual deficits 03/03/2012  . Atrial  fibrillation ([facility]) 03/03/2012  Precious Bard, PT, DPT   06/27/2019, 4:51 PM  [facility] MAIN [facility] SERVICES [address] [place], [place], [address] Phone: [phone]   Fax:  [phone]  Name: [name] MRN: [id number] Date of Birth: 1934/10/23

## 2019-06-29 ENCOUNTER — Ambulatory Visit: Payer: 59

## 2019-06-29 ENCOUNTER — Other Ambulatory Visit: Payer: Self-pay

## 2019-06-29 DIAGNOSIS — R2681 Unsteadiness on feet: Secondary | ICD-10-CM

## 2019-06-29 DIAGNOSIS — M6281 Muscle weakness (generalized): Secondary | ICD-10-CM

## 2019-06-29 DIAGNOSIS — R262 Difficulty in walking, not elsewhere classified: Secondary | ICD-10-CM

## 2019-06-29 NOTE — Therapy (Signed)
[place] [facility] MAIN [facility] SERVICES [address] [place], [place], [address] Phone: [phone]   Fax:  [phone]  Physical Therapy Treatment  Patient Details  Name: [name] MRN: [id number] Date of Birth: 03/14/1934 Referring Provider (PT): Bud Face   Encounter Date: 06/29/2019  PT End of Session - 06/29/19 1309    Visit Number  6    Number of Visits  25    Date for PT Re-Evaluation  08/31/19    Authorization Type  eval: 06/08/19    PT Start Time  1302    PT Stop Time  1345    PT Time Calculation (min)  43 min    Equipment Utilized During Treatment  Gait belt    Activity Tolerance  Patient tolerated treatment well    Behavior During Therapy  [facility] for tasks assessed/performed       Past Medical History:  Diagnosis Date  . Allergy   . Atrial fibrillation ([facility]) 2014   presumed as on xarelto after CVA 2014  . Bilateral primary osteoarthritis of knee    severe by xrays  . Carotid stenosis 10/25/2015   Mild by [place] 2014  . Diabetes mellitus without complication (HCC) 1994  . Heart disease   . Hepatitis    age 6  . HLD (hyperlipidemia)   . Hypertension   . Hypothyroidism   . Ischemic stroke ([facility]) 2014   R basal ganglion infarct with L hemiparesis 2014, now largely resolved, uses cane  . Rheumatic fever   . Wears dentures    upper and lower    Past Surgical History:  Procedure Laterality Date  . CATARACT EXTRACTION W/PHACO Right 12/25/2015   Procedure: CATARACT EXTRACTION PHACO AND INTRAOCULAR LENS PLACEMENT (IOC);  Surgeon: Nevada Crane, MD;  Location: [facility];  Service: Ophthalmology;  Laterality: Right;  DIABETIC NEEDS INTERPRETER RIGHT  . COLONOSCOPY  2013   per patient WNL ([place])  . TOTAL ABDOMINAL HYSTERECTOMY W/ BILATERAL SALPINGOOPHORECTOMY  1972    There were no vitals filed for this visit.  Subjective Assessment - 06/29/19 1258    Subjective  Patient presents with grandson, declines use of  interpreter. Reports some bilateral knee pain with activity but none at rest currently. No specific questions or concerns currently.    Pertinent History  Pt referred for physical therapy for falls and poor balance. Daughter assists with history and reports worsening balance since her CVA 4 years ago. She was in [place] when she had her stroke and daughter reports that pt was not very fond of the therapy believing it was not very helpful. Daughter states that pt has had 3 falls in the last 6 months the last one that occurred on 05/12/19. She has struck her head during a couple falls with bruising noted. Pt is on blood thinners and there is a concern for repeated falls. She uses a wide base single point cane for ambulation in the house and a rollator when out in the community.    Limitations  Walking    Patient Stated Goals  Daughter would like to improve patients strength and balance    Currently in Pain?  No/denies   Chronic bilateral knee pain       TREATMENT   Ther-ex  Seated marches with green tband x 20 bilateral; Seated clams with green tband 3s hold x 20 bilateral; Seated adductor squeezes with ball between knees 3s hold x 20 bilateral; Seated LAQ with 3# ankle weights (AW) x  20 bilateral; Seated HS curls with green tband x 20 bilateral; Standing hip flexion marches with 3 AW and BUE support x 20 bilateral; Standing hip abduction with 3# AW and BUE support x 20 bilateral; Standing heel raises with BUE support x 20; Supine SLR with 3# AW x 20 bilateral; Hooklying bridges x 10, pt denies any knee pain during bridges;   Neuromuscular Re-education (all without UE support) NBOS eye open/closed x 30s each; NBOS eyes open horizontal and vertical head turns x 30s each; 6" alternating toe taps with cues for light touch to challenge SLS, slightly limited due to knee pain;   Pt educated throughout session about proper posture and technique with exercises. Improved exercise technique,  movement at target joints, use of target muscles after min to mod verbal, visual, tactile cues.    Pt demonstrates excellent motivation during session. Most of exercises performed in sitting or supine due to increase in knee pain in standing. She fatigues relatively easily so seated rest breaks provided throughout session. Added additional resistance to ankle weights. Continued with balance exercises as well today. Pt will benefit from PT services to address deficits in strength, balance, and mobility in order to return to full function at home.                               PT Short Term Goals - 06/09/19 1054      PT SHORT TERM GOAL #1   Title  Pt and daughter will be independent with HEP in order to improve strength and balance in order to decrease fall risk and improve function at home.    Time  6    Period  Weeks    Status  New    Target Date  07/21/19        PT Long Term Goals - 06/09/19 1055      PT LONG TERM GOAL #1   Title  Pt will improve BERG by at least 3 points in order to demonstrate clinically significant improvement in balance.    Baseline  06/08/19: 36/56    Time  12    Period  Weeks    Status  New    Target Date  08/31/19      PT LONG TERM GOAL #2   Title  Pt will increase FOTO to at least 54 in order to demonstrate clinically significant improvement in function at home    Baseline  06/08/19: 36    Time  12    Period  Weeks    Status  New    Target Date  08/31/19      PT LONG TERM GOAL #3   Title  Pt will decrease 5TSTS to below 12 seconds in order to demonstrate clinically significant improvement in LE strength.    Baseline  06/08/19: 14.6s    Time  12    Period  Weeks    Status  New    Target Date  08/31/19      PT LONG TERM GOAL #4   Title  Pt will decrease TUG to below 14 seconds/decrease in order to demonstrate decreased fall risk.    Baseline  06/08/19: 17.9s    Time  12    Period  Weeks    Status  New    Target Date   08/31/19      PT LONG TERM GOAL #5   Title  Pt will increase self-selected  10MWT by at least 0.13 m/s in order to demonstrate clinically significant improvement in community ambulation.    Baseline  06/08/19: self-selected: 16.3s = 0.61 m/s    Time  12    Period  Weeks    Status  New    Target Date  08/31/19            Plan - 06/29/19 1311    Clinical Impression Statement  Pt demonstrates excellent motivation during session. Most of exercises performed in sitting or supine due to increase in knee pain in standing. She fatigues relatively easily so seated rest breaks provided throughout session. Added additional resistance to ankle weights. Continued with balance exercises as well today. Pt will benefit from PT services to address deficits in strength, balance, and mobility in order to return to full function at home.    Personal Factors and Comorbidities  Age;Comorbidity 3+;Past/Current Experience;Time since onset of injury/illness/exacerbation    Comorbidities  DM, CVA, MCI, OA    Examination-Activity Limitations  Caring for Others;Carry;Lift;Locomotion Level;Squat;Stairs;Stand;Transfers    Examination-Participation Restrictions  Cleaning;Community Activity;Laundry;Medication Management;Meal Prep;Shop    Stability/Clinical Decision Making  Unstable/Unpredictable    Rehab Potential  Fair    PT Frequency  2x / week    PT Duration  12 weeks    PT Treatment/Interventions  ADLs/Self Care Home Management;Aquatic Therapy;Biofeedback;Canalith Repostioning;Cryotherapy;Electrical Stimulation;Iontophoresis 4mg /ml Dexamethasone;Moist Heat;Traction;Ultrasound;DME Instruction;Gait training;Stair training;Functional mobility training;Therapeutic activities;Therapeutic exercise;Balance training;Neuromuscular re-education;Cognitive remediation;Patient/family education;Manual techniques;Passive range of motion;Dry needling;Taping;Vestibular;Spinal Manipulations;Joint Manipulations    PT Next Visit Plan   Balance and strengthening    PT Home Exercise Plan  Medbridge Access Code: [email or web address]    Consulted and Agree with Plan of Care  Patient       Patient will benefit from skilled therapeutic intervention in order to improve the following deficits and impairments:  Abnormal gait, Decreased balance, Decreased mobility, Decreased strength, Difficulty walking, Pain  Visit Diagnosis: Unsteadiness on feet  Difficulty in walking, not elsewhere classified  Muscle weakness (generalized)     Problem List Patient Active Problem List   Diagnosis Date Noted  . MCI (mild cognitive impairment) 05/13/2019  . Anemia 08/12/2017  . Weight loss 08/10/2017  . Health maintenance examination 01/26/2016  . Advanced care planning/counseling discussion 01/26/2016  . Insomnia 01/26/2016  . Iron deficiency 01/26/2016  . Carotid stenosis 10/25/2015  . Osteopenia 10/25/2015  . Primary osteoarthritis of both knees 10/24/2015  . Type 2 diabetes mellitus with complication, with long-term current use of insulin ([place]) 10/24/2015  . Corns and callus 10/24/2015  . Dyslipidemia   . Hypothyroidism   . History of ischemic stroke without residual deficits 03/03/2012  . Atrial fibrillation ([place]) 03/03/2012   Phillips Grout PT, DPT, GCS  Whitt Auletta 06/29/2019, 3:53 PM  [place] MAIN [facility] SERVICES [address] [place], [place], [address] Phone: [phone]   Fax:  [phone]  Name: [name] MRN: [id number] Date of Birth: May 04, 1934

## 2019-06-30 ENCOUNTER — Encounter: Payer: Self-pay | Admitting: Family Medicine

## 2019-06-30 NOTE — Telephone Encounter (Signed)
Dr. Patsy Lager,   I have placed the retro auth for her January synvisc injection and also requested for approval for q54mth frequency ongoing injections.   I will make you aware as soon as we receive a decision.  Patients daughter made aware through my chart.   Let me know if you need any further support with this.   Thanks.

## 2019-07-01 ENCOUNTER — Ambulatory Visit
Admission: RE | Admit: 2019-07-01 | Discharge: 2019-07-01 | Disposition: A | Discharge status: Home / Self Care | Payer: 59 | Source: Ambulatory Visit | Attending: Neurology | Admitting: Neurology

## 2019-07-01 ENCOUNTER — Other Ambulatory Visit: Payer: Self-pay

## 2019-07-01 DIAGNOSIS — G3184 Mild cognitive impairment, so stated: Secondary | ICD-10-CM | POA: Insufficient documentation

## 2019-07-04 ENCOUNTER — Other Ambulatory Visit: Payer: Self-pay

## 2019-07-04 ENCOUNTER — Ambulatory Visit: Payer: 59 | Attending: Otolaryngology

## 2019-07-04 DIAGNOSIS — M6281 Muscle weakness (generalized): Secondary | ICD-10-CM | POA: Insufficient documentation

## 2019-07-04 DIAGNOSIS — R262 Difficulty in walking, not elsewhere classified: Secondary | ICD-10-CM | POA: Diagnosis present

## 2019-07-04 DIAGNOSIS — R2681 Unsteadiness on feet: Secondary | ICD-10-CM | POA: Insufficient documentation

## 2019-07-04 NOTE — Therapy (Signed)
[place] MAIN [facility] SERVICES [address] [place], [place], [address] Phone: [phone]   Fax:  [phone]  Physical Therapy Treatment  Patient Details  Name: [name] MRN: [id number] Date of Birth: 13-Jun-1934 Referring Provider (PT): Carloyn Manner   Encounter Date: 07/04/2019  PT End of Session - 07/04/19 1712    Visit Number  7    Number of Visits  25    Date for PT Re-Evaluation  08/31/19    Authorization Type  eval: 06/08/19    PT Start Time  1600    PT Stop Time  1645    PT Time Calculation (min)  45 min    Equipment Utilized During Treatment  Gait belt    Activity Tolerance  Patient tolerated treatment well    Behavior During Therapy  [facility] for tasks assessed/performed       Past Medical History:  Diagnosis Date  . Allergy   . Atrial fibrillation ([place]) 2014   presumed as on xarelto after CVA 2014  . Bilateral primary osteoarthritis of knee    severe by xrays  . Carotid stenosis 10/25/2015   Mild by [place] 2014  . Diabetes mellitus without complication ([place]) 3235  . Heart disease   . Hepatitis    age 85  . HLD (hyperlipidemia)   . Hypertension   . Hypothyroidism   . Ischemic stroke ([place]) 2014   R basal ganglion infarct with L hemiparesis 2014, now largely resolved, uses cane  . Rheumatic fever   . Wears dentures    upper and lower    Past Surgical History:  Procedure Laterality Date  . CATARACT EXTRACTION W/PHACO Right 12/25/2015   Procedure: CATARACT EXTRACTION PHACO AND INTRAOCULAR LENS PLACEMENT (IOC);  Surgeon: Eulogio Bear, MD;  Location: [place];  Service: Ophthalmology;  Laterality: Right;  DIABETIC NEEDS INTERPRETER RIGHT  . COLONOSCOPY  2013   per patient WNL ([place])  . TOTAL ABDOMINAL HYSTERECTOMY W/ BILATERAL SALPINGOOPHORECTOMY  1972    There were no vitals filed for this visit.  Subjective Assessment - 07/04/19 1612    Subjective  Patient presents with daughter, declines use of  interpreter. Reports some bilateral knee pain with activity but none at rest currently. She complains of some knee pain following last therapy session however pt attributes it to excessive time on the stationary bike at home. No specific questions or concerns currently.    Pertinent History  Pt referred for physical therapy for falls and poor balance. Daughter assists with history and reports worsening balance since her CVA 4 years ago. She was in [place] when she had her stroke and daughter reports that pt was not very fond of the therapy believing it was not very helpful. Daughter states that pt has had 3 falls in the last 6 months the last one that occurred on 05/12/19. She has struck her head during a couple falls with bruising noted. Pt is on blood thinners and there is a concern for repeated falls. She uses a wide base single point cane for ambulation in the house and a rollator when out in the community.    Limitations  Walking    Patient Stated Goals  Daughter would like to improve patients strength and balance    Currently in Pain?  No/denies          TREATMENT   Ther-ex Hooklying bridges with bolster under knees 2-3s hold 2 x 15 bilateral; Supine SLR with 3# ankle weights (AW)  2 x 15 bilateral; Sidelying hip abduction SLR with 3# AW 2 x 15 bilateral; Hooklying clams with manual resistance 3s hold 2 x 15 bilateral; Hooklying adductor squeeze with manual resistance 3s hold 2 x 15 bilateral; Seated marches 2 x 15 bilateral; Seated HS curls with green tband 2 x 15;   Neuromuscular Re-education (all without UE support) NBOS on Airex eyes open/closed x 30s each; 1/2 foam roll (flat side up) static balance x 60s; NBOS on Airex with horizontal and vertical head turns x 30s each; 6" alternating toe taps with cues for light touch to challenge SLS, slightly limited due to knee pain;   Pt educated throughout session about proper posture and technique with exercises. Improved  exercise technique, movement at target joints, use of target muscles after min to mod verbal, visual, tactile cues.   Pt demonstrates excellent motivation during session. Most of exercises performed in sitting or supine due to increase in knee pain in standing. She fatigues relatively easily so intermittent rest breaks provided throughout session. Continue to demonstrate difficulty with balance when performing horizontal and vertical head turns especially on unstable surfaces.Pt will benefit from PT services to address deficits in strength, balance, and mobility in order to return to full function at home.                       PT Short Term Goals - 06/09/19 1054      PT SHORT TERM GOAL #1   Title  Pt and daughter will be independent with HEP in order to improve strength and balance in order to decrease fall risk and improve function at home.    Time  6    Period  Weeks    Status  New    Target Date  07/21/19        PT Long Term Goals - 06/09/19 1055      PT LONG TERM GOAL #1   Title  Pt will improve BERG by at least 3 points in order to demonstrate clinically significant improvement in balance.    Baseline  06/08/19: 36/56    Time  12    Period  Weeks    Status  New    Target Date  08/31/19      PT LONG TERM GOAL #2   Title  Pt will increase FOTO to at least 54 in order to demonstrate clinically significant improvement in function at home    Baseline  06/08/19: 36    Time  12    Period  Weeks    Status  New    Target Date  08/31/19      PT LONG TERM GOAL #3   Title  Pt will decrease 5TSTS to below 12 seconds in order to demonstrate clinically significant improvement in LE strength.    Baseline  06/08/19: 14.6s    Time  12    Period  Weeks    Status  New    Target Date  08/31/19      PT LONG TERM GOAL #4   Title  Pt will decrease TUG to below 14 seconds/decrease in order to demonstrate decreased fall risk.    Baseline  06/08/19: 17.9s    Time  12     Period  Weeks    Status  New    Target Date  08/31/19      PT LONG TERM GOAL #5   Title  Pt will increase self-selected by at  least 0.13 m/s in order to demonstrate clinically significant improvement in community ambulation.    Baseline  06/08/19: self-selected: 16.3s = 0.61 m/s    Time  12    Period  Weeks    Status  New    Target Date  08/31/19            Plan - 07/04/19 1712    Clinical Impression Statement  Pt demonstrates excellent motivation during session. Most of exercises performed in sitting or supine due to increase in knee pain in standing. She fatigues relatively easily so intermittent rest breaks provided throughout session. Continue to demonstrate difficulty with balance when performing horizontal and vertical head turns especially on unstable surfaces. Pt will benefit from PT services to address deficits in strength, balance, and mobility in order to return to full function at home.    Personal Factors and Comorbidities  Age;Comorbidity 3+;Past/Current Experience;Time since onset of injury/illness/exacerbation    Comorbidities  DM, CVA, MCI, OA    Examination-Activity Limitations  Caring for Others;Carry;Lift;Locomotion Level;Squat;Stairs;Stand;Transfers    Examination-Participation Restrictions  Cleaning;Community Activity;Laundry;Medication Management;Meal Prep;Shop    Stability/Clinical Decision Making  Unstable/Unpredictable    Rehab Potential  Fair    PT Frequency  2x / week    PT Duration  12 weeks    PT Treatment/Interventions  ADLs/Self Care Home Management;Aquatic Therapy;Biofeedback;Canalith Repostioning;Cryotherapy;Electrical Stimulation;Iontophoresis 4mg /ml Dexamethasone;Moist Heat;Traction;Ultrasound;DME Instruction;Gait training;Stair training;Functional mobility training;Therapeutic activities;Therapeutic exercise;Balance training;Neuromuscular re-education;Cognitive remediation;Patient/family education;Manual techniques;Passive range of motion;Dry  needling;Taping;Vestibular;Spinal Manipulations;Joint Manipulations    PT Next Visit Plan  Balance and strengthening    PT Home Exercise Plan  Medbridge Access Code: [email or web address]    Consulted and Agree with Plan of Care  Patient       Patient will benefit from skilled therapeutic intervention in order to improve the following deficits and impairments:  Abnormal gait, Decreased balance, Decreased mobility, Decreased strength, Difficulty walking, Pain  Visit Diagnosis: Unsteadiness on feet  Difficulty in walking, not elsewhere classified  Muscle weakness (generalized)     Problem List Patient Active Problem List   Diagnosis Date Noted  . MCI (mild cognitive impairment) 05/13/2019  . Anemia 08/12/2017  . Weight loss 08/10/2017  . Health maintenance examination 01/26/2016  . Advanced care planning/counseling discussion 01/26/2016  . Insomnia 01/26/2016  . Iron deficiency 01/26/2016  . Carotid stenosis 10/25/2015  . Osteopenia 10/25/2015  . Primary osteoarthritis of both knees 10/24/2015  . Type 2 diabetes mellitus with complication, with long-term current use of insulin (HCC) 10/24/2015  . Corns and callus 10/24/2015  . Dyslipidemia   . Hypothyroidism   . History of ischemic stroke without residual deficits 03/03/2012  . Atrial fibrillation (HCC) 03/03/2012   05/01/2012 PT, DPT, GCS  Kemet Nijjar 07/04/2019, 5:23 PM  [place] [facility] MAIN [facility] SERVICES [address] [place], [place], [place] Phone: [phone]   Fax:  [phone]  Name: Kaydan Wilhoite MRN: Domingo Pulse Date of Birth: 12-31-1934

## 2019-07-06 ENCOUNTER — Ambulatory Visit: Payer: 59

## 2019-07-06 ENCOUNTER — Other Ambulatory Visit: Payer: Self-pay

## 2019-07-06 DIAGNOSIS — R2681 Unsteadiness on feet: Secondary | ICD-10-CM

## 2019-07-06 DIAGNOSIS — M6281 Muscle weakness (generalized): Secondary | ICD-10-CM

## 2019-07-06 DIAGNOSIS — R262 Difficulty in walking, not elsewhere classified: Secondary | ICD-10-CM

## 2019-07-06 NOTE — Therapy (Signed)
[place] MAIN [facility] SERVICES [address] [place], [place], [address] Phone: [phone]   Fax:  [phone]  Physical Therapy Treatment  Patient Details  Name: [name] MRN: [id number] Date of Birth: [birth year] Referring Provider (PT): Carloyn Manner   Encounter Date: 07/06/2019  PT End of Session - 07/06/19 1441    Visit Number  8    Number of Visits  25    Date for PT Re-Evaluation  08/31/19    Authorization Type  eval: 06/08/19    PT Start Time  6712    PT Stop Time  1515    PT Time Calculation (min)  41 min    Equipment Utilized During Treatment  Gait belt    Activity Tolerance  Patient tolerated treatment well    Behavior During Therapy  [facility] for tasks assessed/performed       Past Medical History:  Diagnosis Date  . Allergy   . Atrial fibrillation ([place]) 2014   presumed as on xarelto after CVA 2014  . Bilateral primary osteoarthritis of knee    severe by xrays  . Carotid stenosis 10/25/2015   Mild by [place] 2014  . Diabetes mellitus without complication ([place]) 4580  . Heart disease   . Hepatitis    age 49  . HLD (hyperlipidemia)   . Hypertension   . Hypothyroidism   . Ischemic stroke ([place]) 2014   R basal ganglion infarct with L hemiparesis 2014, now largely resolved, uses cane  . Rheumatic fever   . Wears dentures    upper and lower    Past Surgical History:  Procedure Laterality Date  . CATARACT EXTRACTION W/PHACO Right 12/25/2015   Procedure: CATARACT EXTRACTION PHACO AND INTRAOCULAR LENS PLACEMENT (IOC);  Surgeon: Eulogio Bear, MD;  Location: [place];  Service: Ophthalmology;  Laterality: Right;  DIABETIC NEEDS INTERPRETER RIGHT  . COLONOSCOPY  2013   per patient WNL ([place])  . TOTAL ABDOMINAL HYSTERECTOMY W/ BILATERAL SALPINGOOPHORECTOMY  1972    There were no vitals filed for this visit.  Subjective Assessment - 07/06/19 1720    Subjective  Patient presents with daughter, declines use of  interpreter. Reports some bilateral knee pain with activity but none at rest currently. She denies any increase in soreness or pain after last therapy session. No specific questions or concerns currently.    Pertinent History  Pt referred for physical therapy for falls and poor balance. Daughter assists with history and reports worsening balance since her CVA 4 years ago. She was in [place] when she had her stroke and daughter reports that pt was not very fond of the therapy believing it was not very helpful. Daughter states that pt has had 3 falls in the last 6 months the last one that occurred on 05/12/19. She has struck her head during a couple falls with bruising noted. Pt is on blood thinners and there is a concern for repeated falls. She uses a wide base single point cane for ambulation in the house and a rollator when out in the community.    Limitations  Walking    Patient Stated Goals  Daughter would like to improve patients strength and balance    Currently in Pain?  No/denies          TREATMENT   Ther-ex Hooklying bridges with bolster under knees 2-3s hold 2 x 15 bilateral; Supine SLR with 3# ankle weights (AW) 2 x 15 bilateral; Sidelying hip abduction SLR with 3# AW  x 10 bilateral, L hip is mildly painful today so second set avoided; Hooklying clams with manual resistance 3s hold 2 x 15 bilateral; Hooklying adductor squeeze with manual resistance 3s hold 2 x 15 bilateral; Seated marches with 3# AW 2 x 15 bilateral; Seated LAQ with 3# AW 2 x 15 bilateral; Seated HS curls with green tband 2 x 15; Standing marches with 3# AW 2 x 15;   Neuromuscular Re-education (all without UE support) WBOS/NBOS on Airex with horizontal and vertical head turns x 30s each; 6" alternating toe taps with cues for light touch to challenge SLS, slightly limited due to knee pain, pt has to maintain at least single UE support throughout; Airex NBOS cone passes with therapist adjusting distance and  direction of cone and pt returning cone on opposite side x multiple bouts on each side;   Pt educated throughout session about proper posture and technique with exercises. Improved exercise technique, movement at target joints, use of target muscles after min to mod verbal, visual, tactile cues.   Pt demonstrates excellent motivation during session. Most of exercises performed in sitting or supine due to increase in knee pain in standing. She fatigues relatively easily so intermittent rest breaks provided throughout session however her endurance does appear to be improving slightly. She continues to demonstrate difficulty with balance when performing horizontal and vertical head turns especially on unstable surfaces.Pt will benefit from PT services to address deficits in strength, balance, and mobility in order to return to full function at home.                           PT Short Term Goals - 06/09/19 1054      PT SHORT TERM GOAL #1   Title  Pt and daughter will be independent with HEP in order to improve strength and balance in order to decrease fall risk and improve function at home.    Time  6    Period  Weeks    Status  New    Target Date  07/21/19        PT Long Term Goals - 06/09/19 1055      PT LONG TERM GOAL #1   Title  Pt will improve BERG by at least 3 points in order to demonstrate clinically significant improvement in balance.    Baseline  06/08/19: 36/56    Time  12    Period  Weeks    Status  New    Target Date  08/31/19      PT LONG TERM GOAL #2   Title  Pt will increase FOTO to at least 54 in order to demonstrate clinically significant improvement in function at home    Baseline  06/08/19: 36    Time  12    Period  Weeks    Status  New    Target Date  08/31/19      PT LONG TERM GOAL #3   Title  Pt will decrease 5TSTS to below 12 seconds in order to demonstrate clinically significant improvement in LE strength.    Baseline  06/08/19:  14.6s    Time  12    Period  Weeks    Status  New    Target Date  08/31/19      PT LONG TERM GOAL #4   Title  Pt will decrease TUG to below 14 seconds/decrease in order to demonstrate decreased fall risk.    Baseline  06/08/19: 17.9s    Time  12    Period  Weeks    Status  New    Target Date  08/31/19      PT LONG TERM GOAL #5   Title  Pt will increase self-selected by at least 0.13 m/s in order to demonstrate clinically significant improvement in community ambulation.    Baseline  06/08/19: self-selected: 16.3s = 0.61 m/s    Time  12    Period  Weeks    Status  New    Target Date  08/31/19            Plan - 07/06/19 1723    Clinical Impression Statement  Pt demonstrates excellent motivation during session. Most of exercises performed in sitting or supine due to increase in knee pain in standing. She fatigues relatively easily so intermittent rest breaks provided throughout session however her endurance does appear to be improving slightly. She continues to demonstrate difficulty with balance when performing horizontal and vertical head turns especially on unstable surfaces. Pt will benefit from PT services to address deficits in strength, balance, and mobility in order to return to full function at home.    Personal Factors and Comorbidities  Age;Comorbidity 3+;Past/Current Experience;Time since onset of injury/illness/exacerbation    Comorbidities  DM, CVA, MCI, OA    Examination-Activity Limitations  Caring for Others;Carry;Lift;Locomotion Level;Squat;Stairs;Stand;Transfers    Examination-Participation Restrictions  Cleaning;Community Activity;Laundry;Medication Management;Meal Prep;Shop    Stability/Clinical Decision Making  Unstable/Unpredictable    Rehab Potential  Fair    PT Frequency  2x / week    PT Duration  12 weeks    PT Treatment/Interventions  ADLs/Self Care Home Management;Aquatic Therapy;Biofeedback;Canalith Repostioning;Cryotherapy;Electrical  Stimulation;Iontophoresis 4mg /ml Dexamethasone;Moist Heat;Traction;Ultrasound;DME Instruction;Gait training;Stair training;Functional mobility training;Therapeutic activities;Therapeutic exercise;Balance training;Neuromuscular re-education;Cognitive remediation;Patient/family education;Manual techniques;Passive range of motion;Dry needling;Taping;Vestibular;Spinal Manipulations;Joint Manipulations    PT Next Visit Plan  Balance and strengthening    PT Home Exercise Plan  Medbridge Access Code: [email or web address]    Consulted and Agree with Plan of Care  Patient       Patient will benefit from skilled therapeutic intervention in order to improve the following deficits and impairments:  Abnormal gait, Decreased balance, Decreased mobility, Decreased strength, Difficulty walking, Pain  Visit Diagnosis: Unsteadiness on feet  Difficulty in walking, not elsewhere classified  Muscle weakness (generalized)     Problem List Patient Active Problem List   Diagnosis Date Noted  . MCI (mild cognitive impairment) 05/13/2019  . Anemia 08/12/2017  . Weight loss 08/10/2017  . Health maintenance examination 01/26/2016  . Advanced care planning/counseling discussion 01/26/2016  . Insomnia 01/26/2016  . Iron deficiency 01/26/2016  . Carotid stenosis 10/25/2015  . Osteopenia 10/25/2015  . Primary osteoarthritis of both knees 10/24/2015  . Type 2 diabetes mellitus with complication, with long-term current use of insulin (HCC) 10/24/2015  . Corns and callus 10/24/2015  . Dyslipidemia   . Hypothyroidism   . History of ischemic stroke without residual deficits 03/03/2012  . Atrial fibrillation (HCC) 03/03/2012   05/01/2012 PT, DPT, GCS  Cally Nygard 07/06/2019, 5:28 PM  [place] MAIN [facility] SERVICES [address] [place], [place], [place] Phone: [phone]   Fax:  [phone]  Name: [name] MRN: Domingo Pulse Date of Birth: 1934/12/10

## 2019-07-11 ENCOUNTER — Ambulatory Visit: Payer: 59

## 2019-07-11 NOTE — Patient Instructions (Incomplete)
TREATMENT ?? ?? ?Ther-ex? ?Hooklying bridges with bolster under knees 2-3s hold 2 x 15 bilateral; ?Supine SLR with 3# ankle weights (AW) 2 x 15 bilateral; ?Sidelying hip abduction SLR with 3# AW x 10 bilateral, L hip is mildly painful today so second set avoided; ?Hooklying clams with?manual resistance 3s hold 2 x 15 bilateral; ?Hooklying adductor squeeze with?manual resistance 3s hold 2 x 15 bilateral; ?Seated marches with 3# AW 2 x 15 bilateral; ?Seated LAQ with 3# AW 2 x 15 bilateral; ?Seated HS curls with green tband 2 x 15; ?Standing marches with 3# AW 2 x 15; ?? ?? ?Neuromuscular Re-education (all without UE support) ?WBOS/NBOS on Airex with horizontal and vertical head turns x 30s each; ?6" alternating toe taps with cues for light touch to challenge SLS, slightly limited due to knee pain, pt has to maintain at least single UE support throughout; ?Airex NBOS cone passes with therapist adjusting distance and direction of cone and pt returning cone on opposite side x multiple bouts on each side;? ?? ?? ?Pt educated throughout session about proper posture and technique with exercises. Improved exercise technique, movement at target joints, use of target muscles after min to mod verbal, visual, tactile cues.? ?? ?? ?Pt demonstrates excellent motivation during session. Most of exercises performed in sitting?or supine?due to increase in knee pain in standing. She fatigues relatively easily so intermittent rest breaks provided throughout session however her endurance does appear to be improving slightly. She continues to demonstrate difficulty with balance when performing horizontal and vertical head turns especially on unstable surfaces.?Pt will benefit from PT services to address deficits in strength, balance, and mobility in order to return to full function at home.? ?? ?? ?

## 2019-07-13 ENCOUNTER — Ambulatory Visit: Payer: 59

## 2019-07-13 ENCOUNTER — Other Ambulatory Visit: Payer: Self-pay

## 2019-07-13 DIAGNOSIS — R2681 Unsteadiness on feet: Secondary | ICD-10-CM

## 2019-07-13 DIAGNOSIS — R262 Difficulty in walking, not elsewhere classified: Secondary | ICD-10-CM

## 2019-07-13 DIAGNOSIS — M6281 Muscle weakness (generalized): Secondary | ICD-10-CM

## 2019-07-13 NOTE — Therapy (Signed)
[place] [facility] MAIN [facility] SERVICES [address] [place], [place], [address] Phone: [phone]   Fax:  [phone]  Physical Therapy Treatment  Patient Details  Name: [name] MRN: [id number] Date of Birth: 1934/06/29 Referring Provider (PT): Bud Face   Encounter Date: 07/13/2019  PT End of Session - 07/13/19 1458    Visit Number  9    Number of Visits  25    Date for PT Re-Evaluation  08/31/19    Authorization Type  eval: 06/08/19    PT Start Time  1432    PT Stop Time  1515    PT Time Calculation (min)  43 min    Equipment Utilized During Treatment  Gait belt    Activity Tolerance  Patient tolerated treatment well    Behavior During Therapy  [facility] for tasks assessed/performed       Past Medical History:  Diagnosis Date  . Allergy   . Atrial fibrillation (HCC) 2014   presumed as on xarelto after CVA 2014  . Bilateral primary osteoarthritis of knee    severe by xrays  . Carotid stenosis 10/25/2015   Mild by [place] 2014  . Diabetes mellitus without complication (HCC) 1994  . Heart disease   . Hepatitis    age 85  . HLD (hyperlipidemia)   . Hypertension   . Hypothyroidism   . Ischemic stroke (HCC) 2014   R basal ganglion infarct with L hemiparesis 2014, now largely resolved, uses cane  . Rheumatic fever   . Wears dentures    upper and lower    Past Surgical History:  Procedure Laterality Date  . CATARACT EXTRACTION W/PHACO Right 12/25/2015   Procedure: CATARACT EXTRACTION PHACO AND INTRAOCULAR LENS PLACEMENT (IOC);  Surgeon: Nevada Crane, MD;  Location: [facility];  Service: Ophthalmology;  Laterality: Right;  DIABETIC NEEDS INTERPRETER RIGHT  . COLONOSCOPY  2013   per patient WNL ([place])  . TOTAL ABDOMINAL HYSTERECTOMY W/ BILATERAL SALPINGOOPHORECTOMY  1972    There were no vitals filed for this visit.  Subjective Assessment - 07/13/19 1631    Subjective  Patient presents with daughter, declines use of  interpreter. Reports continued bilateral knee pain with activity but none at rest. She denies any increase in soreness or pain after last therapy session. No specific questions or concerns currently.    Pertinent History  Pt referred for physical therapy for falls and poor balance. Daughter assists with history and reports worsening balance since her CVA 4 years ago. She was in [place] when she had her stroke and daughter reports that pt was not very fond of the therapy believing it was not very helpful. Daughter states that pt has had 3 falls in the last 6 months the last one that occurred on 05/12/19. She has struck her head during a couple falls with bruising noted. Pt is on blood thinners and there is a concern for repeated falls. She uses a wide base single point cane for ambulation in the house and a rollator when out in the community.    Limitations  Walking    Patient Stated Goals  Daughter would like to improve patients strength and balance    Currently in Pain?  No/denies         TREATMENT   Ther-ex Hooklying bridges 2 x 10; Supine SLR with 4# ankle weights (AW) x 15 bilateral; Sidelying hip abduction SLR with 3# AW x 10 bilateral, L hip is mildly painful again today;  Seated clams with manual resistance 3s hold x 15 bilateral; Seated adductor squeeze with manual resistance 3s hold x 15 bilateral; Seated marches with 3# AW 2 x 15 bilateral; Seated LAQ with manual resistance from therapist x 15 bilateral; Seated HS curls with green tband 2 x 15;   Neuromuscular Re-education (all without UE support) 1/2 foam roll A/P balance without UE support; 1/2 foam roll heel/toe rocking without UE support, pt requires continual reminder to not hold onto bars;   Pt educated throughout session about proper posture and technique with exercises. Improved exercise technique, movement at target joints, use of target muscles after min to mod verbal, visual, tactile cues.   Pt demonstrates  excellent motivation during session. Most exercises performed in sitting or supine due to increase in knee pain in standing. She requires intermittent rest breaks throughout session however her endurance does appear to be improving slightly. She continues to demonstrate difficulty with balance especially on unstable 1/2 foam bolster today. Notable fear of falling while attempting balance exercises today.She will need updated outcome measures, goals, and progress note at next visit. Pt will benefit from PT services to address deficits in strength, balance, and mobility in order to return to full function at home.                              PT Short Term Goals - 06/09/19 1054      PT SHORT TERM GOAL #1   Title  Pt and daughter will be independent with HEP in order to improve strength and balance in order to decrease fall risk and improve function at home.    Time  6    Period  Weeks    Status  New    Target Date  07/21/19        PT Long Term Goals - 06/09/19 1055      PT LONG TERM GOAL #1   Title  Pt will improve BERG by at least 3 points in order to demonstrate clinically significant improvement in balance.    Baseline  06/08/19: 36/56    Time  12    Period  Weeks    Status  New    Target Date  08/31/19      PT LONG TERM GOAL #2   Title  Pt will increase FOTO to at least 54 in order to demonstrate clinically significant improvement in function at home    Baseline  06/08/19: 36    Time  12    Period  Weeks    Status  New    Target Date  08/31/19      PT LONG TERM GOAL #3   Title  Pt will decrease 5TSTS to below 12 seconds in order to demonstrate clinically significant improvement in LE strength.    Baseline  06/08/19: 14.6s    Time  12    Period  Weeks    Status  New    Target Date  08/31/19      PT LONG TERM GOAL #4   Title  Pt will decrease TUG to below 14 seconds/decrease in order to demonstrate decreased fall risk.    Baseline  06/08/19: 17.9s     Time  12    Period  Weeks    Status  New    Target Date  08/31/19      PT LONG TERM GOAL #5   Title  Pt will increase self-selected  by at least 0.13 m/s in order to demonstrate clinically significant improvement in community ambulation.    Baseline  06/08/19: self-selected: 16.3s = 0.61 m/s    Time  12    Period  Weeks    Status  New    Target Date  08/31/19            Plan - 07/13/19 1632    Clinical Impression Statement  Pt demonstrates excellent motivation during session. Most exercises performed in sitting or supine due to increase in knee pain in standing. She requires intermittent rest breaks throughout session however her endurance does appear to be improving slightly. She continues to demonstrate difficulty with balance especially on unstable 1/2 foam bolster today. Notable fear of falling while attempting balance exercises today. She will need updated outcome measures, goals, and progress note at next visit. Pt will benefit from PT services to address deficits in strength, balance, and mobility in order to return to full function at home.    Personal Factors and Comorbidities  Age;Comorbidity 3+;Past/Current Experience;Time since onset of injury/illness/exacerbation    Comorbidities  DM, CVA, MCI, OA    Examination-Activity Limitations  Caring for Others;Carry;Lift;Locomotion Level;Squat;Stairs;Stand;Transfers    Examination-Participation Restrictions  Cleaning;Community Activity;Laundry;Medication Management;Meal Prep;Shop    Stability/Clinical Decision Making  Unstable/Unpredictable    Rehab Potential  Fair    PT Frequency  2x / week    PT Duration  12 weeks    PT Treatment/Interventions  ADLs/Self Care Home Management;Aquatic Therapy;Biofeedback;Canalith Repostioning;Cryotherapy;Electrical Stimulation;Iontophoresis 4mg /ml Dexamethasone;Moist Heat;Traction;Ultrasound;DME Instruction;Gait training;Stair training;Functional mobility training;Therapeutic  activities;Therapeutic exercise;Balance training;Neuromuscular re-education;Cognitive remediation;Patient/family education;Manual techniques;Passive range of motion;Dry needling;Taping;Vestibular;Spinal Manipulations;Joint Manipulations    PT Next Visit Plan  Update outcome measures, goals, progress note, Progress balance and strengthening    PT Home Exercise Plan  Medbridge Access Code: [email or web address]    Consulted and Agree with Plan of Care  Patient       Patient will benefit from skilled therapeutic intervention in order to improve the following deficits and impairments:  Abnormal gait, Decreased balance, Decreased mobility, Decreased strength, Difficulty walking, Pain  Visit Diagnosis: Unsteadiness on feet  Difficulty in walking, not elsewhere classified  Muscle weakness (generalized)     Problem List Patient Active Problem List   Diagnosis Date Noted  . MCI (mild cognitive impairment) 05/13/2019  . Anemia 08/12/2017  . Weight loss 08/10/2017  . Health maintenance examination 01/26/2016  . Advanced care planning/counseling discussion 01/26/2016  . Insomnia 01/26/2016  . Iron deficiency 01/26/2016  . Carotid stenosis 10/25/2015  . Osteopenia 10/25/2015  . Primary osteoarthritis of both knees 10/24/2015  . Type 2 diabetes mellitus with complication, with long-term current use of insulin (HCC) 10/24/2015  . Corns and callus 10/24/2015  . Dyslipidemia   . Hypothyroidism   . History of ischemic stroke without residual deficits 03/03/2012  . Atrial fibrillation (HCC) 03/03/2012   05/01/2012 PT, DPT, GCS  [name] 07/13/2019, 4:41 PM  [place] [facility] MAIN [facility] SERVICES [address] [place], [place], [place] Phone: [phone]   Fax:  [phone]  Name: [name] MRN: Domingo Pulse Date of Birth: December 05, 1934

## 2019-07-18 ENCOUNTER — Ambulatory Visit: Payer: 59

## 2019-07-18 ENCOUNTER — Other Ambulatory Visit: Payer: Self-pay

## 2019-07-18 DIAGNOSIS — R2681 Unsteadiness on feet: Secondary | ICD-10-CM | POA: Diagnosis not present

## 2019-07-18 DIAGNOSIS — R262 Difficulty in walking, not elsewhere classified: Secondary | ICD-10-CM

## 2019-07-18 DIAGNOSIS — M6281 Muscle weakness (generalized): Secondary | ICD-10-CM

## 2019-07-18 NOTE — Therapy (Signed)
[place] [facility] MAIN [facility] SERVICES [address] [place], [place], [address] Phone: [phone]   Fax:  [phone]  Physical Therapy Progress Note   Dates of reporting period  06/08/19   to   07/18/19  Patient Details  Name: [name] MRN: [id number] Date of Birth: [birth year] Referring Provider (PT): Bud Face   Encounter Date: 07/18/2019  PT End of Session - 07/18/19 1643    Visit Number  10    Number of Visits  25    Date for PT Re-Evaluation  08/31/19    Authorization Type  eval: 06/08/19    PT Start Time  1645    PT Stop Time  1730    PT Time Calculation (min)  45 min    Equipment Utilized During Treatment  Gait belt    Activity Tolerance  Patient tolerated treatment well    Behavior During Therapy  WFL for tasks assessed/performed       Past Medical History:  Diagnosis Date  . Allergy   . Atrial fibrillation (HCC) 2014   presumed as on xarelto after CVA 2014  . Bilateral primary osteoarthritis of knee    severe by xrays  . Carotid stenosis 10/25/2015   Mild by [place] 2014  . Diabetes mellitus without complication (HCC) 1994  . Heart disease   . Hepatitis    age 45  . HLD (hyperlipidemia)   . Hypertension   . Hypothyroidism   . Ischemic stroke (HCC) 2014   R basal ganglion infarct with L hemiparesis 2014, now largely resolved, uses cane  . Rheumatic fever   . Wears dentures    upper and lower    Past Surgical History:  Procedure Laterality Date  . CATARACT EXTRACTION W/PHACO Right 12/25/2015   Procedure: CATARACT EXTRACTION PHACO AND INTRAOCULAR LENS PLACEMENT (IOC);  Surgeon: Nevada Crane, MD;  Location: [facility];  Service: Ophthalmology;  Laterality: Right;  DIABETIC NEEDS INTERPRETER RIGHT  . COLONOSCOPY  2013   per patient WNL ([place])  . TOTAL ABDOMINAL HYSTERECTOMY W/ BILATERAL SALPINGOOPHORECTOMY  1972    There were no vitals filed for this visit.  Subjective Assessment - 07/18/19 1643     Subjective  Patient presents with daughter, declines use of interpreter. Reports continued bilateral knee pain with activity but none at rest. No specific questions or concerns currently.    Pertinent History  Pt referred for physical therapy for falls and poor balance. Daughter assists with history and reports worsening balance since her CVA 4 years ago. She was in [place] when she had her stroke and daughter reports that pt was not very fond of the therapy believing it was not very helpful. Daughter states that pt has had 3 falls in the last 6 months the last one that occurred on 05/12/19. She has struck her head during a couple falls with bruising noted. Pt is on blood thinners and there is a concern for repeated falls. She uses a wide base single point cane for ambulation in the house and a rollator when out in the community.    Limitations  Walking    Patient Stated Goals  Daughter would like to improve patients strength and balance    Currently in Pain?  No/denies        TREATMENT   Neuromuscular Re-education  Updated outcome measures and goals with patient:   Results Comments  BERG 36/56 Fall risk, in need of intervention  TUG 28.2s Fall risk, in need of  intervention  5TSTS 21.0s Fall risk, in need of intervention  10 Meter Gait Speed Self-selected: 26. 5s =  0.38 m/s; Fastest: 23.7s = 0.42 m/s Below normative values for full community ambulation  ABC Scale 1.3%  Fall risk, in need of intervention  FOTO 23 Predicted improvement to 54  DHI 78 High self-reported disability     Updated outcome measures and goals with patient today. Unfortunately she is not demonstrating improvement at this time. She actually showed worsening of her TUG, 5TSTS, 55m gait speed, FOTO, and DHI. Her BERG was unchanged at 36/56 and her ABC was also essentially unchanged going from 0% at initial evaluation to 1.3% today. With respect to self-report measures they are completed by patient with significant  assistance from daughter. Unsure why objective outcome measures worsened, whether it is related to pt effort or true decline. Pt has been reporting continued bilateral knee pain as well as increase in L hip pain since having a fall in the grass a few weeks back. Encouraged daughter to seek assessment of L hip pain by patient's MD. Provided emphasis to patient and daughter regarding importance of continued strengthening/conditioning at home. Will decrease frequency of therapy to once/week with continued emphasis on independent management.           [facility] PT Assessment - 07/18/19 1724      Berg Balance Test   Sit to Stand  Able to stand without using hands and stabilize independently    Standing Unsupported  Able to stand safely 2 minutes    Sitting with Back Unsupported but Feet Supported on Floor or Stool  Able to sit safely and securely 2 minutes    Stand to Sit  Controls descent by using hands    Transfers  Able to transfer safely, definite need of hands    Standing Unsupported with Eyes Closed  Able to stand 10 seconds with supervision    Standing Unsupported with Feet Together  Able to place feet together independently and stand for 1 minute with supervision    From Standing, Reach Forward with Outstretched Arm  Can reach forward >12 cm safely (5")    From Standing Position, Pick up Object from Floor  Able to pick up shoe, needs supervision    From Standing Position, Turn to Look Behind Over each Shoulder  Looks behind one side only/other side shows less weight shift    Turn 360 Degrees  Needs close supervision or verbal cueing    Standing Unsupported, Alternately Place Feet on Step/Stool  Needs assistance to keep from falling or unable to try    Standing Unsupported, One Foot in Front  Able to take small step independently and hold 30 seconds    Standing on One Leg  Unable to try or needs assist to prevent fall    Total Score  36                             PT  Short Term Goals - 07/18/19 1643      PT SHORT TERM GOAL #1   Title  Pt and daughter will be independent with HEP in order to improve strength and balance in order to decrease fall risk and improve function at home.    Time  6    Period  Weeks    Status  On-going    Target Date  07/21/19        PT Long Term Goals -  07/18/19 1644      PT LONG TERM GOAL #1   Title  Pt will improve BERG by at least 3 points in order to demonstrate clinically significant improvement in balance.    Baseline  06/08/19: 36/56; 07/18/19: 36/56    Time  12    Period  Weeks    Status  On-going    Target Date  08/31/19      PT LONG TERM GOAL #2   Title  Pt will increase FOTO to at least 54 in order to demonstrate clinically significant improvement in function at home    Baseline  06/08/19: 36; 07/18/19: 23    Time  12    Period  Weeks    Status  On-going    Target Date  08/31/19      PT LONG TERM GOAL #3   Title  Pt will decrease 5TSTS to below 12 seconds in order to demonstrate clinically significant improvement in LE strength.    Baseline  06/08/19: 14.6s; 07/18/19: 21.0s    Time  12    Period  Weeks    Status  On-going    Target Date  08/31/19      PT LONG TERM GOAL #4   Title  Pt will decrease TUG to below 14 seconds/decrease in order to demonstrate decreased fall risk.    Baseline  06/08/19: 17.9s; 07/19/19: 28.2s    Time  12    Period  Weeks    Status  On-going    Target Date  08/31/19      PT LONG TERM GOAL #5   Title  Pt will increase self-selected 10MWT by at least 0.13 m/s in order to demonstrate clinically significant improvement in community ambulation.    Baseline  06/08/19: self-selected: 16.3s = 0.61 m/s; 07/18/19: self-selected: 26.5s = 0.38 m/s    Time  12    Period  Weeks    Status  On-going    Target Date  08/31/19            Plan - 07/18/19 1643    Clinical Impression Statement  Updated outcome measures and goals with patient today. Unfortunately she is not demonstrating  improvement at this time. She actually showed worsening of her TUG, 5TSTS, 86m gait speed, FOTO, and DHI. Her BERG was unchanged at 36/56 and her ABC was also essentially unchanged going from 0% at initial evaluation to 1.3% today. With respect to self-report measures they are completed by patient with significant assistance from daughter. Unsure why objective outcome measures worsened, whether it is related to pt effort or true decline. Pt has been reporting continued bilateral knee pain as well as increase in L hip pain since having a fall in the grass a few weeks back. Encouraged daughter to seek assessment of L hip pain by patient's MD. Provided emphasis to patient and daughter regarding importance of continued strengthening/conditioning at home. Will decrease frequency of therapy to once/week with continued emphasis on independent management    Personal Factors and Comorbidities  Age;Comorbidity 3+;Past/Current Experience;Time since onset of injury/illness/exacerbation    Comorbidities  DM, CVA, MCI, OA    Examination-Activity Limitations  Caring for Others;Carry;Lift;Locomotion Level;Squat;Stairs;Stand;Transfers    Examination-Participation Restrictions  Cleaning;Community Activity;Laundry;Medication Management;Meal Prep;Shop    Stability/Clinical Decision Making  Unstable/Unpredictable    Rehab Potential  Fair    PT Frequency  2x / week    PT Duration  12 weeks    PT Treatment/Interventions  ADLs/Self Care Home Management;Aquatic Therapy;Biofeedback;Canalith Repostioning;Cryotherapy;Dealer  Stimulation;Iontophoresis 4mg /ml Dexamethasone;Moist Heat;Traction;Ultrasound;DME Instruction;Gait training;Stair training;Functional mobility training;Therapeutic activities;Therapeutic exercise;Balance training;Neuromuscular re-education;Cognitive remediation;Patient/family education;Manual techniques;Passive range of motion;Dry needling;Taping;Vestibular;Spinal Manipulations;Joint Manipulations    PT Next  Visit Plan  Progress balance and strengthening    PT Home Exercise Plan  Medbridge Access Code: [email or web address]    Consulted and Agree with Plan of Care  Patient       Patient will benefit from skilled therapeutic intervention in order to improve the following deficits and impairments:  Abnormal gait, Decreased balance, Decreased mobility, Decreased strength, Difficulty walking, Pain  Visit Diagnosis: Unsteadiness on feet  Difficulty in walking, not elsewhere classified  Muscle weakness (generalized)     Problem List Patient Active Problem List   Diagnosis Date Noted  . MCI (mild cognitive impairment) 05/13/2019  . Anemia 08/12/2017  . Weight loss 08/10/2017  . Health maintenance examination 01/26/2016  . Advanced care planning/counseling discussion 01/26/2016  . Insomnia 01/26/2016  . Iron deficiency 01/26/2016  . Carotid stenosis 10/25/2015  . Osteopenia 10/25/2015  . Primary osteoarthritis of both knees 10/24/2015  . Type 2 diabetes mellitus with complication, with long-term current use of insulin (HCC) 10/24/2015  . Corns and callus 10/24/2015  . Dyslipidemia   . Hypothyroidism   . History of ischemic stroke without residual deficits 03/03/2012  . Atrial fibrillation (HCC) 03/03/2012   05/01/2012 PT, DPT, GCS  [name] 07/19/2019, 8:41 AM  [place] [facility] MAIN [facility] SERVICES [address] [place], [place], [place] Phone: [phone]   Fax:  [phone]  Name: [name] MRN: Domingo Pulse Date of Birth: Jan 23, 1935

## 2019-07-20 ENCOUNTER — Ambulatory Visit: Payer: 59

## 2019-07-25 ENCOUNTER — Ambulatory Visit: Payer: 59

## 2019-07-25 NOTE — Telephone Encounter (Signed)
I have not received updated status on retro-preauth starting 03/21/19 for q13mth B knee synvisc injections faxed to insurance on 06/30/19.   I have left message with Prior auth center for [facility] for update today, waiting on return call.

## 2019-07-26 ENCOUNTER — Ambulatory Visit: Payer: 59 | Admitting: Podiatry

## 2019-07-27 ENCOUNTER — Ambulatory Visit: Payer: 59

## 2019-07-27 DIAGNOSIS — R262 Difficulty in walking, not elsewhere classified: Secondary | ICD-10-CM

## 2019-07-27 DIAGNOSIS — R2681 Unsteadiness on feet: Secondary | ICD-10-CM | POA: Diagnosis not present

## 2019-07-27 DIAGNOSIS — M6281 Muscle weakness (generalized): Secondary | ICD-10-CM

## 2019-07-27 NOTE — Therapy (Signed)
[facility] MAIN [facility] SERVICES [address] [place], [place], [address] Phone: [phone]   Fax:  [phone]  Physical Therapy Treatment  Patient Details  Name: [name] MRN: [id number] Date of Birth: 12-20-1934 Referring Provider (PT): Bud Face   Encounter Date: 07/27/2019  PT End of Session - 07/27/19 1656    Visit Number  11    Number of Visits  25    Date for PT Re-Evaluation  08/31/19    Authorization Type  eval: 06/08/19    PT Start Time  1648    PT Stop Time  1730    PT Time Calculation (min)  42 min    Equipment Utilized During Treatment  Gait belt    Activity Tolerance  Patient tolerated treatment well    Behavior During Therapy  [facility] for tasks assessed/performed       Past Medical History:  Diagnosis Date  . Allergy   . Atrial fibrillation (HCC) 2014   presumed as on xarelto after CVA 2014  . Bilateral primary osteoarthritis of knee    severe by xrays  . Carotid stenosis 10/25/2015   Mild by [place] 2014  . Diabetes mellitus without complication (HCC) 1994  . Heart disease   . Hepatitis    age 45  . HLD (hyperlipidemia)   . Hypertension   . Hypothyroidism   . Ischemic stroke (HCC) 2014   R basal ganglion infarct with L hemiparesis 2014, now largely resolved, uses cane  . Rheumatic fever   . Wears dentures    upper and lower    Past Surgical History:  Procedure Laterality Date  . CATARACT EXTRACTION W/PHACO Right 12/25/2015   Procedure: CATARACT EXTRACTION PHACO AND INTRAOCULAR LENS PLACEMENT (IOC);  Surgeon: Nevada Crane, MD;  Location: [facility];  Service: Ophthalmology;  Laterality: Right;  DIABETIC NEEDS INTERPRETER RIGHT  . COLONOSCOPY  2013   per patient WNL ([place])  . TOTAL ABDOMINAL HYSTERECTOMY W/ BILATERAL SALPINGOOPHORECTOMY  1972    There were no vitals filed for this visit.  Subjective Assessment - 07/27/19 1653    Subjective  Patient presents with daughter, declines use  of interpreter. Reports continued bilateral knee pain with activity but none at rest. She saw Dr. Sherryll Burger who believes that her hip pain is likely musculoskeletal and he prescribed baclofen. The hip pain has improved slightly.    Pertinent History  Pt referred for physical therapy for falls and poor balance. Daughter assists with history and reports worsening balance since her CVA 4 years ago. She was in [place] when she had her stroke and daughter reports that pt was not very fond of the therapy believing it was not very helpful. Daughter states that pt has had 3 falls in the last 6 months the last one that occurred on 05/12/19. She has struck her head during a couple falls with bruising noted. Pt is on blood thinners and there is a concern for repeated falls. She uses a wide base single point cane for ambulation in the house and a rollator when out in the community.    Limitations  Walking    Patient Stated Goals  Daughter would like to improve patients strength and balance    Currently in Pain?  No/denies   No hip or knee pain reported at rest          TREATMENT   Ther-ex NuStep L0/1 x 5 minutes for warm-up during history (4 minutes unbilled); Hooklying bridges 2 x  15; Supine SLR with 4# ankle weights (AW) x 15 bilateral; Sidelying hip abduction SLR with 4# AW x 15 bilateral, L hip is mildly painful again today so ankle weight removed; Hooklying clams with manual resistance 3s hold x 15 bilateral; Hooklying adductor squeeze with manual resistance 3s hold x 15 bilateral; Supine manually resisted leg press x 15 BLE;   Pt educated throughout session about proper posture and technique with exercises. Improved exercise technique, movement at target joints, use of target muscles after min to mod verbal, visual, tactile cues.   Pt demonstrates excellent motivation during session. Most exercises performed in supine/hooklying today due to continued L hip and bilateral knee pain. She requires  intermittent rest breaks throughout session. She reports a mild increase in L hip pain with ankle weights during sidelying L hip abduction so ankle weight removed. Pt encouraged to continue her HEP. At this point her knee pain is significantly limiting to her progress. Pt will benefit from PT services to address deficits in strength, balance, and mobility in order to return to full function at home.                         PT Short Term Goals - 07/18/19 1643      PT SHORT TERM GOAL #1   Title  Pt and daughter will be independent with HEP in order to improve strength and balance in order to decrease fall risk and improve function at home.    Time  6    Period  Weeks    Status  On-going    Target Date  07/21/19        PT Long Term Goals - 07/18/19 1644      PT LONG TERM GOAL #1   Title  Pt will improve BERG by at least 3 points in order to demonstrate clinically significant improvement in balance.    Baseline  06/08/19: 36/56; 07/18/19: 36/56    Time  12    Period  Weeks    Status  On-going    Target Date  08/31/19      PT LONG TERM GOAL #2   Title  Pt will increase FOTO to at least 54 in order to demonstrate clinically significant improvement in function at home    Baseline  06/08/19: 36; 07/18/19: 23    Time  12    Period  Weeks    Status  On-going    Target Date  08/31/19      PT LONG TERM GOAL #3   Title  Pt will decrease 5TSTS to below 12 seconds in order to demonstrate clinically significant improvement in LE strength.    Baseline  06/08/19: 14.6s; 07/18/19: 21.0s    Time  12    Period  Weeks    Status  On-going    Target Date  08/31/19      PT LONG TERM GOAL #4   Title  Pt will decrease TUG to below 14 seconds/decrease in order to demonstrate decreased fall risk.    Baseline  06/08/19: 17.9s; 07/19/19: 28.2s    Time  12    Period  Weeks    Status  On-going    Target Date  08/31/19      PT LONG TERM GOAL #5   Title  Pt will increase self-selected  by at least 0.13 m/s in order to demonstrate clinically significant improvement in community ambulation.    Baseline  06/08/19: self-selected:  16.3s = 0.61 m/s; 07/18/19: self-selected: 26.5s = 0.38 m/s    Time  12    Period  Weeks    Status  On-going    Target Date  08/31/19            Plan - 07/27/19 1657    Clinical Impression Statement  Pt demonstrates excellent motivation during session. Most exercises performed in supine/hooklying today due to continued L hip and bilateral knee pain. She requires intermittent rest breaks throughout session. She reports a mild increase in L hip pain with ankle weights during sidelying L hip abduction so ankle weight removed. Pt encouraged to continue her HEP. At this point her knee pain is significantly limiting to her progress. Pt will benefit from PT services to address deficits in strength, balance, and mobility in order to return to full function at home.    Personal Factors and Comorbidities  Age;Comorbidity 3+;Past/Current Experience;Time since onset of injury/illness/exacerbation    Comorbidities  DM, CVA, MCI, OA    Examination-Activity Limitations  Caring for Others;Carry;Lift;Locomotion Level;Squat;Stairs;Stand;Transfers    Examination-Participation Restrictions  Cleaning;Community Activity;Laundry;Medication Management;Meal Prep;Shop    Stability/Clinical Decision Making  Unstable/Unpredictable    Rehab Potential  Fair    PT Frequency  2x / week    PT Duration  12 weeks    PT Treatment/Interventions  ADLs/Self Care Home Management;Aquatic Therapy;Biofeedback;Canalith Repostioning;Cryotherapy;Electrical Stimulation;Iontophoresis 4mg /ml Dexamethasone;Moist Heat;Traction;Ultrasound;DME Instruction;Gait training;Stair training;Functional mobility training;Therapeutic activities;Therapeutic exercise;Balance training;Neuromuscular re-education;Cognitive remediation;Patient/family education;Manual techniques;Passive range of motion;Dry  needling;Taping;Vestibular;Spinal Manipulations;Joint Manipulations    PT Next Visit Plan  Progress balance and strengthening    PT Home Exercise Plan  Medbridge Access Code: [email or web address]    Consulted and Agree with Plan of Care  Patient       Patient will benefit from skilled therapeutic intervention in order to improve the following deficits and impairments:  Abnormal gait, Decreased balance, Decreased mobility, Decreased strength, Difficulty walking, Pain  Visit Diagnosis: Unsteadiness on feet  Difficulty in walking, not elsewhere classified  Muscle weakness (generalized)     Problem List Patient Active Problem List   Diagnosis Date Noted  . MCI (mild cognitive impairment) 05/13/2019  . Anemia 08/12/2017  . Weight loss 08/10/2017  . Health maintenance examination 01/26/2016  . Advanced care planning/counseling discussion 01/26/2016  . Insomnia 01/26/2016  . Iron deficiency 01/26/2016  . Carotid stenosis 10/25/2015  . Osteopenia 10/25/2015  . Primary osteoarthritis of both knees 10/24/2015  . Type 2 diabetes mellitus with complication, with long-term current use of insulin ([place]) 10/24/2015  . Corns and callus 10/24/2015  . Dyslipidemia   . Hypothyroidism   . History of ischemic stroke without residual deficits 03/03/2012  . Atrial fibrillation ([place]) 03/03/2012   Phillips Grout PT, DPT, GCS  Jayliana Valencia 07/28/2019, 2:23 PM  [place] MAIN [facility] SERVICES [address] [place], [place], [address] Phone: [phone]   Fax:  [phone]  Name: [name] MRN: [id number] Date of Birth: [birth year]

## 2019-07-30 ENCOUNTER — Encounter: Payer: Self-pay | Admitting: Family Medicine

## 2019-08-02 NOTE — Telephone Encounter (Signed)
I have not received an update from the company yet. I will call them today and see what I can find out.

## 2019-08-03 ENCOUNTER — Other Ambulatory Visit: Payer: Self-pay

## 2019-08-03 ENCOUNTER — Ambulatory Visit: Payer: 59 | Attending: Otolaryngology

## 2019-08-03 DIAGNOSIS — R262 Difficulty in walking, not elsewhere classified: Secondary | ICD-10-CM

## 2019-08-03 DIAGNOSIS — R2681 Unsteadiness on feet: Secondary | ICD-10-CM | POA: Insufficient documentation

## 2019-08-03 DIAGNOSIS — M6281 Muscle weakness (generalized): Secondary | ICD-10-CM | POA: Diagnosis present

## 2019-08-03 NOTE — Therapy (Signed)
[facility] MAIN [facility] SERVICES [address] [place], [place], [address] Phone: [phone]   Fax:  [phone]  Physical Therapy Treatment  Patient Details  Name: [name] MRN: [id number] Date of Birth: 02/21/1935 Referring Provider (PT): Bud Face   Encounter Date: 08/03/2019  PT End of Session - 08/03/19 1526    Visit Number  12    Number of Visits  25    Date for PT Re-Evaluation  08/31/19    Authorization Type  eval: 06/08/19    PT Start Time  1520    PT Stop Time  1600    PT Time Calculation (min)  40 min    Equipment Utilized During Treatment  Gait belt    Activity Tolerance  Patient tolerated treatment well    Behavior During Therapy  [facility] for tasks assessed/performed       Past Medical History:  Diagnosis Date  . Allergy   . Atrial fibrillation (HCC) 2014   presumed as on xarelto after CVA 2014  . Bilateral primary osteoarthritis of knee    severe by xrays  . Carotid stenosis 10/25/2015   Mild by [place] 2014  . Diabetes mellitus without complication (HCC) 1994  . Heart disease   . Hepatitis    age 51  . HLD (hyperlipidemia)   . Hypertension   . Hypothyroidism   . Ischemic stroke (HCC) 2014   R basal ganglion infarct with L hemiparesis 2014, now largely resolved, uses cane  . Rheumatic fever   . Wears dentures    upper and lower    Past Surgical History:  Procedure Laterality Date  . CATARACT EXTRACTION W/PHACO Right 12/25/2015   Procedure: CATARACT EXTRACTION PHACO AND INTRAOCULAR LENS PLACEMENT (IOC);  Surgeon: Nevada Crane, MD;  Location: [facility];  Service: Ophthalmology;  Laterality: Right;  DIABETIC NEEDS INTERPRETER RIGHT  . COLONOSCOPY  2013   per patient WNL ([place])  . TOTAL ABDOMINAL HYSTERECTOMY W/ BILATERAL SALPINGOOPHORECTOMY  1972    There were no vitals filed for this visit.  Subjective Assessment - 08/03/19 1525    Subjective  Patient presents with daughter, declines use of  interpreter. Reports continued bilateral knee pain with activity but none at rest. The hip pain continues to improve. She continues performing stationary bike at home and denies any increase in pain during activity however does have some mild increase following.    Pertinent History  Pt referred for physical therapy for falls and poor balance. Daughter assists with history and reports worsening balance since her CVA 4 years ago. She was in [place] when she had her stroke and daughter reports that pt was not very fond of the therapy believing it was not very helpful. Daughter states that pt has had 3 falls in the last 6 months the last one that occurred on 05/12/19. She has struck her head during a couple falls with bruising noted. Pt is on blood thinners and there is a concern for repeated falls. She uses a wide base single point cane for ambulation in the house and a rollator when out in the community.    Limitations  Walking    Patient Stated Goals  Daughter would like to improve patients strength and balance    Currently in Pain?  No/denies          TREATMENT   Ther-ex NuStep L0/1 x 5 minutes for warm-up during history (4 minutes unbilled); Hooklying bridges 2 x 15; Supine SLR with 4#  ankle weights (AW) 2 x 15 bilateral; Sidelying hip abduction SLR, no ankle weights x 15 BLE; Hooklying clams with manual resistance 3s hold 2 x 15 bilateral; Hooklying adductor squeeze with manual resistance 3s hold 2 x 15 bilateral; Supine manually resisted leg press x 15 BLE; Sit to stand with BUE handheld support 2 x 10, cues for foot placement to minimize knee pain;   Neuromuscular Re-education  Static standing balance on Airex pad in wide stance without UE support x 30s; Airex balance with horizontal and vertical head turns x 30s each; Airex balance with balloon passes with student PT;   Pt educated throughout session about proper posture and technique with exercises. Improved exercise technique,  movement at target joints, use of target muscles after min to mod verbal, visual, tactile cues.   Pt demonstrates excellent motivation during session. Most exercises performed in supine/hooklying today due to continued L hip and bilateral knee pain. She requires intermittent rest breaks throughout session. Incorporated standing balance exercises today into session. Pt demonstrates considerable anxiety regarding imbalance and fear of falling. Pt encouraged to continue her HEP. At this point her knee pain is significantly limiting to her progress. Pt will benefit from PT services to address deficits in strength, balance, and mobility in order to return to full function at home.                            PT Short Term Goals - 07/18/19 1643      PT SHORT TERM GOAL #1   Title  Pt and daughter will be independent with HEP in order to improve strength and balance in order to decrease fall risk and improve function at home.    Time  6    Period  Weeks    Status  On-going    Target Date  07/21/19        PT Long Term Goals - 07/18/19 1644      PT LONG TERM GOAL #1   Title  Pt will improve BERG by at least 3 points in order to demonstrate clinically significant improvement in balance.    Baseline  06/08/19: 36/56; 07/18/19: 36/56    Time  12    Period  Weeks    Status  On-going    Target Date  08/31/19      PT LONG TERM GOAL #2   Title  Pt will increase FOTO to at least 54 in order to demonstrate clinically significant improvement in function at home    Baseline  06/08/19: 36; 07/18/19: 23    Time  12    Period  Weeks    Status  On-going    Target Date  08/31/19      PT LONG TERM GOAL #3   Title  Pt will decrease 5TSTS to below 12 seconds in order to demonstrate clinically significant improvement in LE strength.    Baseline  06/08/19: 14.6s; 07/18/19: 21.0s    Time  12    Period  Weeks    Status  On-going    Target Date  08/31/19      PT LONG TERM GOAL #4    Title  Pt will decrease TUG to below 14 seconds/decrease in order to demonstrate decreased fall risk.    Baseline  06/08/19: 17.9s; 07/19/19: 28.2s    Time  12    Period  Weeks    Status  On-going    Target Date  08/31/19      PT LONG TERM GOAL #5   Title  Pt will increase self-selected by at least 0.13 m/s in order to demonstrate clinically significant improvement in community ambulation.    Baseline  06/08/19: self-selected: 16.3s = 0.61 m/s; 07/18/19: self-selected: 26.5s = 0.38 m/s    Time  12    Period  Weeks    Status  On-going    Target Date  08/31/19            Plan - 08/03/19 1526    Clinical Impression Statement  Pt demonstrates excellent motivation during session. Most exercises performed in supine/hooklying today due to continued L hip and bilateral knee pain. She requires intermittent rest breaks throughout session. Incorporated standing balance exercises today into session. Pt demonstrates considerable anxiety regarding imbalance and fear of falling. Pt encouraged to continue her HEP. At this point her knee pain is significantly limiting to her progress. Pt will benefit from PT services to address deficits in strength, balance, and mobility in order to return to full function at home.    Personal Factors and Comorbidities  Age;Comorbidity 3+;Past/Current Experience;Time since onset of injury/illness/exacerbation    Comorbidities  DM, CVA, MCI, OA    Examination-Activity Limitations  Caring for Others;Carry;Lift;Locomotion Level;Squat;Stairs;Stand;Transfers    Examination-Participation Restrictions  Cleaning;Community Activity;Laundry;Medication Management;Meal Prep;Shop    Stability/Clinical Decision Making  Unstable/Unpredictable    Rehab Potential  Fair    PT Frequency  2x / week    PT Duration  12 weeks    PT Treatment/Interventions  ADLs/Self Care Home Management;Aquatic Therapy;Biofeedback;Canalith Repostioning;Cryotherapy;Electrical Stimulation;Iontophoresis 4mg /ml  Dexamethasone;Moist Heat;Traction;Ultrasound;DME Instruction;Gait training;Stair training;Functional mobility training;Therapeutic activities;Therapeutic exercise;Balance training;Neuromuscular re-education;Cognitive remediation;Patient/family education;Manual techniques;Passive range of motion;Dry needling;Taping;Vestibular;Spinal Manipulations;Joint Manipulations    PT Next Visit Plan  Progress balance and strengthening    PT Home Exercise Plan  Medbridge Access Code: [email or web address]    Consulted and Agree with Plan of Care  Patient       Patient will benefit from skilled therapeutic intervention in order to improve the following deficits and impairments:  Abnormal gait, Decreased balance, Decreased mobility, Decreased strength, Difficulty walking, Pain  Visit Diagnosis: Unsteadiness on feet  Difficulty in walking, not elsewhere classified  Muscle weakness (generalized)     Problem List Patient Active Problem List   Diagnosis Date Noted  . MCI (mild cognitive impairment) 05/13/2019  . Anemia 08/12/2017  . Weight loss 08/10/2017  . Health maintenance examination 01/26/2016  . Advanced care planning/counseling discussion 01/26/2016  . Insomnia 01/26/2016  . Iron deficiency 01/26/2016  . Carotid stenosis 10/25/2015  . Osteopenia 10/25/2015  . Primary osteoarthritis of both knees 10/24/2015  . Type 2 diabetes mellitus with complication, with long-term current use of insulin (HCC) 10/24/2015  . Corns and callus 10/24/2015  . Dyslipidemia   . Hypothyroidism   . History of ischemic stroke without residual deficits 03/03/2012  . Atrial fibrillation (HCC) 03/03/2012   05/01/2012 PT, DPT, GCS  [name] 08/04/2019, 11:01 AM  [facility] MAIN [facility] SERVICES [address] [place], [place], [place] Phone: [phone]   Fax:  [phone]  Name: [name] MRN: Domingo Pulse Date of Birth: 1934/07/15

## 2019-08-08 ENCOUNTER — Ambulatory Visit: Payer: 59

## 2019-08-09 ENCOUNTER — Telehealth: Payer: Self-pay | Admitting: Family Medicine

## 2019-08-09 NOTE — Telephone Encounter (Signed)
[facility] called in regards to a pa that submitted They stated it was for an injection. They did not know the name  She stated that the PA was denied and that they have faxed over a copy of the denial    PHONE - [phone]   She provided a denial # [id number]

## 2019-08-09 NOTE — Telephone Encounter (Signed)
Do not see where a PA was submitted by our office.  I will check into this.

## 2019-08-10 ENCOUNTER — Other Ambulatory Visit: Payer: Self-pay

## 2019-08-10 ENCOUNTER — Ambulatory Visit: Payer: 59

## 2019-08-10 DIAGNOSIS — R262 Difficulty in walking, not elsewhere classified: Secondary | ICD-10-CM

## 2019-08-10 DIAGNOSIS — R2681 Unsteadiness on feet: Secondary | ICD-10-CM | POA: Diagnosis not present

## 2019-08-10 DIAGNOSIS — M6281 Muscle weakness (generalized): Secondary | ICD-10-CM

## 2019-08-10 NOTE — Therapy (Signed)
[facility] MAIN [facility] SERVICES [address] [place], [place], [address] Phone: [phone]   Fax:  [phone]  Physical Therapy Treatment  Patient Details  Name: [name] MRN: [id number] Date of Birth: 19-Oct-1934 Referring Provider (PT): Bud Face   Encounter Date: 08/10/2019   PT End of Session - 08/12/19 0805    Visit Number 13    Number of Visits 25    Date for PT Re-Evaluation 08/31/19    Authorization Type eval: 06/08/19    PT Start Time 1600    PT Stop Time 1645    PT Time Calculation (min) 45 min    Equipment Utilized During Treatment Gait belt    Activity Tolerance Patient tolerated treatment well    Behavior During Therapy [facility] for tasks assessed/performed           Past Medical History:  Diagnosis Date   Allergy    Atrial fibrillation (HCC) 2014   presumed as on xarelto after CVA 2014   Bilateral primary osteoarthritis of knee    severe by xrays   Carotid stenosis 10/25/2015   Mild by [place] 2014   Diabetes mellitus without complication (HCC) 1994   Heart disease    Hepatitis    age 79   HLD (hyperlipidemia)    Hypertension    Hypothyroidism    Ischemic stroke (HCC) 2014   R basal ganglion infarct with L hemiparesis 2014, now largely resolved, uses cane   Rheumatic fever    Wears dentures    upper and lower    Past Surgical History:  Procedure Laterality Date   CATARACT EXTRACTION W/PHACO Right 12/25/2015   Procedure: CATARACT EXTRACTION PHACO AND INTRAOCULAR LENS PLACEMENT (IOC);  Surgeon: Nevada Crane, MD;  Location: [facility];  Service: Ophthalmology;  Laterality: Right;  DIABETIC NEEDS INTERPRETER RIGHT   COLONOSCOPY  2013   per patient WNL ([place])   TOTAL ABDOMINAL HYSTERECTOMY W/ BILATERAL SALPINGOOPHORECTOMY  1972    There were no vitals filed for this visit.   Subjective Assessment - 08/12/19 0805    Subjective Patient presents with daughter, declines use of  interpreter. Reports continued bilateral knee pain with activity but none at rest. Her hip pain continues to improve and only hurts once in a while. She continues performing stationary bike some at home. No specific questions or concerns currently.    Pertinent History Pt referred for physical therapy for falls and poor balance. Daughter assists with history and reports worsening balance since her CVA 4 years ago. She was in [place] when she had her stroke and daughter reports that pt was not very fond of the therapy believing it was not very helpful. Daughter states that pt has had 3 falls in the last 6 months the last one that occurred on 05/12/19. She has struck her head during a couple falls with bruising noted. Pt is on blood thinners and there is a concern for repeated falls. She uses a wide base single point cane for ambulation in the house and a rollator when out in the community.    Limitations Walking    Patient Stated Goals Daughter would like to improve patients strength and balance              TREATMENT   Ther-ex NuStep L2 x 6 minutes for warm-up during history (4 minutes unbilled); Seated exercises with 4# ankle weights (AW): Marches x 20 BLE; LAQ x 20 BLE;  Standing exercises with 4# AW: [address]  x 20 BLE; Hip abduction x 20 BLE; HS curls x 20 BLE; Hip extension x 20 BLE;  Seated clams with manual resistance x 20 BLE; Seated adductor squeeze with manual resistance x 20 BLE;  Sit to stand with BUE handheld support x 10, cues for foot placement to minimize knee pain;   Neuromuscular Re-education  Airex NBOS ball passes around body with return pass on opposite side, therapist adjusting height between waist and overhead and also increasing distance and direction from body; Airex NBOS ball toss into dirty equipment box with pt turning to pick up ball followed by throw x multiple bouts; Airex WBOS eyes open/closed x 30s each; Airex NBOS eyes open/closed x 30s  each; Airex staggerred balance with front foot on 6" step x 30s with each leg;   Pt educated throughout session about proper posture and technique with exercises. Improved exercise technique, movement at target joints, use of target muscles after min to mod verbal, visual, tactile cues.   Pt demonstrates excellent motivation during session. Progressed additional standing exercises today however she is somewhat limited secondary to knee pain. She requires intermittent rest breaks throughout session due to knee pain and fatigue. Incorporated additional standing balance exercises today into session. Pt is able to perform more dynamic balance activities today however she does intermittent try to reach for UE support. Pt encouraged to continue her HEP. At this point her knee pain is significantly limiting to her progress. Pt will benefit from PT services to address deficits in strength, balance, and mobility in order to return to full function at home.                            PT Short Term Goals - 07/18/19 1643      PT SHORT TERM GOAL #1   Title Pt and daughter will be independent with HEP in order to improve strength and balance in order to decrease fall risk and improve function at home.    Time 6    Period Weeks    Status On-going    Target Date 07/21/19             PT Long Term Goals - 07/18/19 1644      PT LONG TERM GOAL #1   Title Pt will improve BERG by at least 3 points in order to demonstrate clinically significant improvement in balance.    Baseline 06/08/19: 36/56; 07/18/19: 36/56    Time 12    Period Weeks    Status On-going    Target Date 08/31/19      PT LONG TERM GOAL #2   Title Pt will increase FOTO to at least 54 in order to demonstrate clinically significant improvement in function at home    Baseline 06/08/19: 36; 07/18/19: 23    Time 12    Period Weeks    Status On-going    Target Date 08/31/19      PT LONG TERM GOAL #3   Title Pt  will decrease 5TSTS to below 12 seconds in order to demonstrate clinically significant improvement in LE strength.    Baseline 06/08/19: 14.6s; 07/18/19: 21.0s    Time 12    Period Weeks    Status On-going    Target Date 08/31/19      PT LONG TERM GOAL #4   Title Pt will decrease TUG to below 14 seconds/decrease in order to demonstrate decreased fall risk.    Baseline 06/08/19:  17.9s; 07/19/19: 28.2s    Time 12    Period Weeks    Status On-going    Target Date 08/31/19      PT LONG TERM GOAL #5   Title Pt will increase self-selected 10MWT by at least 0.13 m/s in order to demonstrate clinically significant improvement in community ambulation.    Baseline 06/08/19: self-selected: 16.3s = 0.61 m/s; 07/18/19: self-selected: 26.5s = 0.38 m/s    Time 12    Period Weeks    Status On-going    Target Date 08/31/19                 Plan - 08/12/19 0806    Clinical Impression Statement Pt demonstrates excellent motivation during session. Progressed additional standing exercises today however she is somewhat limited secondary to knee pain. She requires intermittent rest breaks throughout session due to knee pain and fatigue. Incorporated additional standing balance exercises today into session. Pt is able to perform more dynamic balance activities today however she does intermittent try to reach for UE support. Pt encouraged to continue her HEP. At this point her knee pain is significantly limiting to her progress. Pt will benefit from PT services to address deficits in strength, balance, and mobility in order to return to full function at home.    Personal Factors and Comorbidities Age;Comorbidity 3+;Past/Current Experience;Time since onset of injury/illness/exacerbation    Comorbidities DM, CVA, MCI, OA    Examination-Activity Limitations Caring for Others;Carry;Lift;Locomotion Level;Squat;Stairs;Stand;Transfers    Examination-Participation Restrictions Cleaning;Community Activity;Laundry;Medication  Management;Meal Prep;Shop    Stability/Clinical Decision Making Unstable/Unpredictable    Rehab Potential Fair    PT Frequency 2x / week    PT Duration 12 weeks    PT Treatment/Interventions ADLs/Self Care Home Management;Aquatic Therapy;Biofeedback;Canalith Repostioning;Cryotherapy;Electrical Stimulation;Iontophoresis 4mg /ml Dexamethasone;Moist Heat;Traction;Ultrasound;DME Instruction;Gait training;Stair training;Functional mobility training;Therapeutic activities;Therapeutic exercise;Balance training;Neuromuscular re-education;Cognitive remediation;Patient/family education;Manual techniques;Passive range of motion;Dry needling;Taping;Vestibular;Spinal Manipulations;Joint Manipulations    PT Next Visit Plan Progress balance and strengthening    PT Home Exercise Plan Medbridge Access Code: [email or web address]    Consulted and Agree with Plan of Care Patient           Patient will benefit from skilled therapeutic intervention in order to improve the following deficits and impairments:  Abnormal gait, Decreased balance, Decreased mobility, Decreased strength, Difficulty walking, Pain  Visit Diagnosis: Unsteadiness on feet  Difficulty in walking, not elsewhere classified  Muscle weakness (generalized)     Problem List Patient Active Problem List   Diagnosis Date Noted   MCI (mild cognitive impairment) 05/13/2019   Anemia 08/12/2017   Weight loss 08/10/2017   Health maintenance examination 01/26/2016   Advanced care planning/counseling discussion 01/26/2016   Insomnia 01/26/2016   Iron deficiency 01/26/2016   Carotid stenosis 10/25/2015   Osteopenia 10/25/2015   Primary osteoarthritis of both knees 10/24/2015   Type 2 diabetes mellitus with complication, with long-term current use of insulin ([place]) 10/24/2015   Corns and callus 10/24/2015   Dyslipidemia    Hypothyroidism    History of ischemic stroke without residual deficits 03/03/2012   Atrial fibrillation ([place])  03/03/2012   Lyndel Safe Bienvenido Proehl PT, DPT, GCS  Lylee Corrow 08/12/2019, 8:08 AM  [place] MAIN [facility] SERVICES [address] [place], [place], [address] Phone: [phone]   Fax:  [phone]  Name: Brin Ruggerio MRN: [id number] Date of Birth: 12-26-1934

## 2019-08-11 ENCOUNTER — Other Ambulatory Visit: Payer: Self-pay | Admitting: Family Medicine

## 2019-08-11 NOTE — Telephone Encounter (Signed)
This is a PA started for knee injections. PA was started here.

## 2019-08-12 NOTE — Telephone Encounter (Signed)
Contacted [employer] and spoke with Jonny Ruiz who reports PA was denied because it was deemed not medically necessary.  They will refax the denial letter.

## 2019-08-15 ENCOUNTER — Ambulatory Visit: Payer: 59

## 2019-08-15 NOTE — Telephone Encounter (Signed)
Discussed with Dr. Patsy Lager. He reported that this was most likely denied due to the request that there be injections every 3 months.  Will look into the possibility of sending in a new PA.

## 2019-08-15 NOTE — Telephone Encounter (Signed)
Contacted [employer] and spoke with Selena Batten R who reports PA was denied because the evidence for the knee injection is not supported. He is faxing the denial.

## 2019-08-16 ENCOUNTER — Encounter: Payer: Self-pay | Admitting: Family Medicine

## 2019-08-16 NOTE — Telephone Encounter (Signed)
Resubmitted PA and only requesting the prior inj to be covered.

## 2019-08-17 ENCOUNTER — Other Ambulatory Visit: Payer: Self-pay

## 2019-08-17 ENCOUNTER — Ambulatory Visit: Payer: 59

## 2019-08-17 DIAGNOSIS — R2681 Unsteadiness on feet: Secondary | ICD-10-CM

## 2019-08-17 DIAGNOSIS — M6281 Muscle weakness (generalized): Secondary | ICD-10-CM

## 2019-08-17 DIAGNOSIS — R262 Difficulty in walking, not elsewhere classified: Secondary | ICD-10-CM

## 2019-08-17 NOTE — Therapy (Signed)
[place] MAIN [facility] SERVICES [address] [place], [place], [address] Phone: [phone]   Fax:  [phone]  Physical Therapy Treatment/Goal Update  Patient Details  Name: [name] MRN: [id number] Date of Birth: [birth year] Referring Provider (PT): Carloyn Manner   Encounter Date: 08/17/2019   PT End of Session - 08/17/19 1558    Visit Number 14    Number of Visits 25    Date for PT Re-Evaluation 08/31/19    Authorization Type eval: 06/08/19    PT Start Time 1600    PT Stop Time 1645    PT Time Calculation (min) 45 min    Equipment Utilized During Treatment Gait belt    Activity Tolerance Patient tolerated treatment well    Behavior During Therapy [facility] for tasks assessed/performed           Past Medical History:  Diagnosis Date  . Allergy   . Atrial fibrillation ([place]) 2014   presumed as on xarelto after CVA 2014  . Bilateral primary osteoarthritis of knee    severe by xrays  . Carotid stenosis 10/25/2015   Mild by [place] 2014  . Diabetes mellitus without complication ([place]) 8416  . Heart disease   . Hepatitis    age 69  . HLD (hyperlipidemia)   . Hypertension   . Hypothyroidism   . Ischemic stroke ([place]) 2014   R basal ganglion infarct with L hemiparesis 2014, now largely resolved, uses cane  . Rheumatic fever   . Wears dentures    upper and lower    Past Surgical History:  Procedure Laterality Date  . CATARACT EXTRACTION W/PHACO Right 12/25/2015   Procedure: CATARACT EXTRACTION PHACO AND INTRAOCULAR LENS PLACEMENT (IOC);  Surgeon: Eulogio Bear, MD;  Location: [facility];  Service: Ophthalmology;  Laterality: Right;  DIABETIC NEEDS INTERPRETER RIGHT  . COLONOSCOPY  2013   per patient WNL ([place])  . TOTAL ABDOMINAL HYSTERECTOMY W/ BILATERAL SALPINGOOPHORECTOMY  1972    There were no vitals filed for this visit.   Subjective Assessment - 08/17/19 1558    Subjective Patient presents with daughter,  declines use of interpreter. Reports continued bilateral knee pain with activity but none at rest. Insurance denied knee injections and her MD office is working on trying to get approval. She continues performing stationary bike some at home as well as resistance band strengthening.. No specific questions or concerns currently.    Pertinent History Pt referred for physical therapy for falls and poor balance. Daughter assists with history and reports worsening balance since her CVA 4 years ago. She was in [place] when she had her stroke and daughter reports that pt was not very fond of the therapy believing it was not very helpful. Daughter states that pt has had 3 falls in the last 6 months the last one that occurred on 05/12/19. She has struck her head during a couple falls with bruising noted. Pt is on blood thinners and there is a concern for repeated falls. She uses a wide base single point cane for ambulation in the house and a rollator when out in the community.    Limitations Walking    Patient Stated Goals Daughter would like to improve patients strength and balance    Currently in Pain? No/denies              [facility] PT Assessment - 08/17/19 1620      Standardized Balance Assessment   Standardized Balance Assessment Berg Balance Test  Berg Balance Test   Sit to Stand Able to stand without using hands and stabilize independently    Standing Unsupported Able to stand safely 2 minutes    Sitting with Back Unsupported but Feet Supported on Floor or Stool Able to sit safely and securely 2 minutes    Stand to Sit Controls descent by using hands    Transfers Able to transfer safely, definite need of hands    Standing Unsupported with Eyes Closed Able to stand 10 seconds with supervision    Standing Unsupported with Feet Together Needs help to attain position but able to stand for 30 seconds with feet together    From Standing, Reach Forward with Outstretched Arm Can reach forward >12 cm  safely (5")    From Standing Position, Pick up Object from Floor Able to pick up shoe, needs supervision    From Standing Position, Turn to Look Behind Over each Shoulder Looks behind from both sides and weight shifts well    Turn 360 Degrees Needs close supervision or verbal cueing    Standing Unsupported, Alternately Place Feet on Step/Stool Needs assistance to keep from falling or unable to try    Standing Unsupported, One Foot in Front Able to take small step independently and hold 30 seconds    Standing on One Leg Tries to lift leg/unable to hold 3 seconds but remains standing independently    Total Score 36             TREATMENT   Neuromuscular Re-education  Updated outcome measures and goals with patient:   07/18/19 08/17/19 Comments  BERG 36/56 36/56 Fall risk, in need of intervention  TUG 28.2s 21.3s Fall risk, in need of intervention  5TSTS 21.0s 13.2s Fall risk, in need of intervention  10 Meter Gait Speed Self-selected: 26. 5s =  0.38 m/s; Fastest: 23.7s = 0.42 m/s Self-selected: 21.7s =  0.46 m/s; Fastest: 17.7s =  0.56 m/s Below normative values for full community ambulation  ABC Scale 1.3%  3.1% Fall risk, in need of intervention  FOTO 23 12 Predicted improvement to 54  DHI 78/100 72/100 High self-reported disability     Ther-ex NuStep L2 x 6 minutes for warm-up during history (4 minutes unbilled); Seated exercises with 4# ankle weights (AW): Marches x 20 BLE; LAQ x 20 BLE;  Standing marches with 4# AW x 10; Standing mini squats 2 x 10; Seated clams with manual resistance x 20 BLE; Seated adductor squeeze with manual resistance x 20 BLE;    Pt educated throughout session about proper posture and technique with exercises. Improved exercise technique, movement at target joints, use of target muscles after min to mod verbal, visual, tactile cues.   Updated outcome measures and goals with patient today. She demonstrates improvement in her outcome  measures since they were last updated however they are essentially unchanged since the initial evaluation. Her BERG has remained unchanged at 36/56 and her ABC was also essentially unchanged going from 0% at initial evaluation to 1.3% last visit and 3.1% today. With respect to self-report measures they are completed by daughter today. Pt has been reporting continued bilateral knee pain and this has been a significant limitation for her therapy sessions. Her MD office is still trying to get the knee injections improved by the insurance company. Provided emphasis to patient and daughter regarding importance of continued strengthening/conditioning at home. Pt will have two more therapy sessions after which she will be discharged to continue independently.  PT Short Term Goals - 08/17/19 1604      PT SHORT TERM GOAL #1   Title Pt and daughter will be independent with HEP in order to improve strength and balance in order to decrease fall risk and improve function at home.    Time 6    Period Weeks    Status On-going    Target Date 07/21/19             PT Long Term Goals - 08/17/19 1604      PT LONG TERM GOAL #1   Title Pt will improve BERG by at least 3 points in order to demonstrate clinically significant improvement in balance.    Baseline 06/08/19: 36/56; 07/18/19: 36/56; 08/17/19: 36/56    Time 12    Period Weeks    Status On-going    Target Date 08/31/19      PT LONG TERM GOAL #2   Title Pt will increase FOTO to at least 54 in order to demonstrate clinically significant improvement in function at home    Baseline 06/08/19: 36; 07/18/19: 23; 08/17/19: 12    Time 12    Period Weeks    Status On-going    Target Date 08/31/19      PT LONG TERM GOAL #3   Title Pt will decrease 5TSTS to below 12 seconds in order to demonstrate clinically significant improvement in LE strength.    Baseline 06/08/19: 14.6s; 07/18/19: 21.0s; 08/17/19: 13.2s    Time 12     Period Weeks    Status On-going    Target Date 08/31/19      PT LONG TERM GOAL #4   Title Pt will decrease TUG to below 14 seconds/decrease in order to demonstrate decreased fall risk.    Baseline 06/08/19: 17.9s; 07/19/19: 28.2s; 08/17/19: 21.3s    Time 12    Period Weeks    Status On-going      PT LONG TERM GOAL #5   Title Pt will increase self-selected by at least 0.13 m/s in order to demonstrate clinically significant improvement in community ambulation.    Baseline 06/08/19: self-selected: 16.3s = 0.61 m/s; 07/18/19: self-selected: 26.5s = 0.38 m/s; 08/17/19: 21.7s = 0.46 m/s    Time 12    Period Weeks    Status On-going    Target Date 08/31/19                 Plan - 08/17/19 1559    Clinical Impression Statement Updated outcome measures and goals with patient today. She demonstrates improvement in her outcome measures since they were last updated however they are essentially unchanged since the initial evaluation. Her BERG has remained unchanged at 36/56 and her ABC was also essentially unchanged going from 0% at initial evaluation to 1.3% last visit and 3.1% today. With respect to self-report measures they are completed by daughter today. Pt has been reporting continued bilateral knee pain and this has been a significant limitation for her therapy sessions. Her MD office is still trying to get the knee injections improved by the insurance company. Provided emphasis to patient and daughter regarding importance of continued strengthening/conditioning at home. Pt will have two more therapy sessions after which she will be discharged to continue independently.    Personal Factors and Comorbidities Age;Comorbidity 3+;Past/Current Experience;Time since onset of injury/illness/exacerbation    Comorbidities DM, CVA, MCI, OA    Examination-Activity Limitations Caring for Others;Carry;Lift;Locomotion Level;Squat;Stairs;Stand;Transfers    Examination-Participation Restrictions  Cleaning;Community Activity;Laundry;Medication Management;Meal Prep;Shop  Stability/Clinical Decision Making Unstable/Unpredictable    Rehab Potential Fair    PT Frequency 2x / week    PT Duration 12 weeks    PT Treatment/Interventions ADLs/Self Care Home Management;Aquatic Therapy;Biofeedback;Canalith Repostioning;Cryotherapy;Electrical Stimulation;Iontophoresis 4mg /ml Dexamethasone;Moist Heat;Traction;Ultrasound;DME Instruction;Gait training;Stair training;Functional mobility training;Therapeutic activities;Therapeutic exercise;Balance training;Neuromuscular re-education;Cognitive remediation;Patient/family education;Manual techniques;Passive range of motion;Dry needling;Taping;Vestibular;Spinal Manipulations;Joint Manipulations    PT Next Visit Plan Progress balance and strengthening. Discharge on 08/31/19 visit    PT Home Exercise Plan Medbridge Access Code: [email or web address]    Consulted and Agree with Plan of Care Patient           Patient will benefit from skilled therapeutic intervention in order to improve the following deficits and impairments:  Abnormal gait, Decreased balance, Decreased mobility, Decreased strength, Difficulty walking, Pain  Visit Diagnosis: Unsteadiness on feet  Difficulty in walking, not elsewhere classified  Muscle weakness (generalized)     Problem List Patient Active Problem List   Diagnosis Date Noted  . MCI (mild cognitive impairment) 05/13/2019  . Anemia 08/12/2017  . Weight loss 08/10/2017  . Health maintenance examination 01/26/2016  . Advanced care planning/counseling discussion 01/26/2016  . Insomnia 01/26/2016  . Iron deficiency 01/26/2016  . Carotid stenosis 10/25/2015  . Osteopenia 10/25/2015  . Primary osteoarthritis of both knees 10/24/2015  . Type 2 diabetes mellitus with complication, with long-term current use of insulin (HCC) 10/24/2015  . Corns and callus 10/24/2015  . Dyslipidemia   . Hypothyroidism   . History of ischemic stroke  without residual deficits 03/03/2012  . Atrial fibrillation (HCC) 03/03/2012   05/01/2012 PT, DPT, GCS  Timiya Howells 08/18/2019, 10:07 AM  [facility] MAIN [facility] SERVICES [address] [place], [place], [place] Phone: [phone]   Fax:  [phone]  Name: [name] MRN: Domingo Pulse Date of Birth: Jul 18, 1934

## 2019-08-19 NOTE — Telephone Encounter (Signed)
The visco injection for patient, [name], has been quite difficult to navigate.  [name] has been helping me from day 1 with it.    So this is where we are:  1.  The Retro Preauth from 03/21/19 injections -Form was resubmitted with correct clinical information on 6/15.  I called yesterday for an update, because it was initially denied, the new request has been entered in as an appeal.  They do not (as of late yesterday pm) have a decision on this and say it can take up to 30 days to process.    But, [employer], does NOT cover ANY Visco injections and this likely will continue to be denied despite clinical need.  We may have to write this off with billing.   2.  I discussed with [name] our options for future injections now that [facility] will not approve any form of visco.  [employer] will not cover any Visco injection period.  [name] says her providers are left with only the following choices for [facility] patients for treatment:  A.  PRP treatment (a separated plasma injection treatment) which is $450.00 out of pocket expense for patient.  B.  Ongoing steroid injections C.  Surgical eval/treatment  I am not sure of what would be our next step with this based on her tx needs but will defer to Dr. Patsy Lager for next step advice.    I will keep checking with the appeals service until we get an answer.  But, sounds like either patient pays out of pocket (2700.00 per injection), considers another tx alternative, or as suggested in the my chart encounter, family looks into changing insurances.  [employer] has terrible coverage in this area.   Let me know how you would like to proceed.

## 2019-08-22 ENCOUNTER — Ambulatory Visit: Payer: 59

## 2019-08-23 ENCOUNTER — Ambulatory Visit: Payer: 59 | Admitting: Podiatry

## 2019-08-24 ENCOUNTER — Other Ambulatory Visit: Payer: Self-pay

## 2019-08-24 ENCOUNTER — Ambulatory Visit: Payer: 59

## 2019-08-24 DIAGNOSIS — M6281 Muscle weakness (generalized): Secondary | ICD-10-CM

## 2019-08-24 DIAGNOSIS — R2681 Unsteadiness on feet: Secondary | ICD-10-CM | POA: Diagnosis not present

## 2019-08-24 DIAGNOSIS — R262 Difficulty in walking, not elsewhere classified: Secondary | ICD-10-CM

## 2019-08-24 NOTE — Therapy (Signed)
[place] [facility] MAIN [facility] SERVICES [address] [place], [place], [address] Phone: [phone]   Fax:  [phone]  Physical Therapy Treatment  Patient Details  Name: [name] MRN: [id number] Date of Birth: October 15, 1934 Referring Provider (PT): Bud Face   Encounter Date: 08/24/2019   PT End of Session - 08/24/19 1709    Visit Number 15    Number of Visits 25    Date for PT Re-Evaluation 08/31/19    Authorization Type eval: 06/08/19    PT Start Time 1645    PT Stop Time 1725    PT Time Calculation (min) 40 min    Equipment Utilized During Treatment Gait belt    Activity Tolerance Patient tolerated treatment well;No increased pain    Behavior During Therapy WFL for tasks assessed/performed           Past Medical History:  Diagnosis Date  . Allergy   . Atrial fibrillation (HCC) 2014   presumed as on xarelto after CVA 2014  . Bilateral primary osteoarthritis of knee    severe by xrays  . Carotid stenosis 10/25/2015   Mild by [place] 2014  . Diabetes mellitus without complication (HCC) 1994  . Heart disease   . Hepatitis    age 85  . HLD (hyperlipidemia)   . Hypertension   . Hypothyroidism   . Ischemic stroke (HCC) 2014   R basal ganglion infarct with L hemiparesis 2014, now largely resolved, uses cane  . Rheumatic fever   . Wears dentures    upper and lower    Past Surgical History:  Procedure Laterality Date  . CATARACT EXTRACTION W/PHACO Right 12/25/2015   Procedure: CATARACT EXTRACTION PHACO AND INTRAOCULAR LENS PLACEMENT (IOC);  Surgeon: Nevada Crane, MD;  Location: [facility];  Service: Ophthalmology;  Laterality: Right;  DIABETIC NEEDS INTERPRETER RIGHT  . COLONOSCOPY  2013   per patient WNL ([place])  . TOTAL ABDOMINAL HYSTERECTOMY W/ BILATERAL SALPINGOOPHORECTOMY  1972    There were no vitals filed for this visit.   Subjective Assessment - 08/24/19 1654    Subjective Pt reports doing well in  general. No updates since last session. DTR reports pt is preparing to move to [place] in July to rotate to the next hijo residency.    Pertinent History Pt referred for physical therapy for falls and poor balance. Daughter assists with history and reports worsening balance since her CVA 4 years ago. She was in [place] when she had her stroke and daughter reports that pt was not very fond of the therapy believing it was not very helpful. Daughter states that pt has had 3 falls in the last 6 months the last one that occurred on 05/12/19. She has struck her head during a couple falls with bruising noted. Pt is on blood thinners and there is a concern for repeated falls. She uses a wide base single point cane for ambulation in the house and a rollator when out in the community.    Limitations Walking    Patient Stated Goals Daughter would like to improve patients strength and balance    Currently in Pain? No/denies          INTERVENTION THIS DATE:  -Nustep for AA/ROM bilat knees, Level 2, seat 7, arms 11 -Fulcrum distraction mobilization bilat knees 3x30sec -Rt SAQ: 2x10 @ 5lb -Lt SAQ: 2x10 @ 2.5lb (higher bolster due ot TKE ROM limitations  -AA/ROM between sets -Seated LAQ -STS 2x10 hands free, minA for  1 LOB  -Reciprocal marching in place BUE support, 5lb AW 1x20  -lateral step ups (6") 1x8 bilat    PT Short Term Goals - 08/17/19 1604      PT SHORT TERM GOAL #1   Title Pt and daughter will be independent with HEP in order to improve strength and balance in order to decrease fall risk and improve function at home.    Time 6    Period Weeks    Status On-going    Target Date 07/21/19             PT Long Term Goals - 08/17/19 1604      PT LONG TERM GOAL #1   Title Pt will improve BERG by at least 3 points in order to demonstrate clinically significant improvement in balance.    Baseline 06/08/19: 36/56; 07/18/19: 36/56; 08/17/19: 36/56    Time 12    Period Weeks    Status On-going     Target Date 08/31/19      PT LONG TERM GOAL #2   Title Pt will increase FOTO to at least 54 in order to demonstrate clinically significant improvement in function at home    Baseline 06/08/19: 36; 07/18/19: 23; 08/17/19: 12    Time 12    Period Weeks    Status On-going    Target Date 08/31/19      PT LONG TERM GOAL #3   Title Pt will decrease 5TSTS to below 12 seconds in order to demonstrate clinically significant improvement in LE strength.    Baseline 06/08/19: 14.6s; 07/18/19: 21.0s; 08/17/19: 13.2s    Time 12    Period Weeks    Status On-going    Target Date 08/31/19      PT LONG TERM GOAL #4   Title Pt will decrease TUG to below 14 seconds/decrease in order to demonstrate decreased fall risk.    Baseline 06/08/19: 17.9s; 07/19/19: 28.2s; 08/17/19: 21.3s    Time 12    Period Weeks    Status On-going      PT LONG TERM GOAL #5   Title Pt will increase self-selected by at least 0.13 m/s in order to demonstrate clinically significant improvement in community ambulation.    Baseline 06/08/19: self-selected: 16.3s = 0.61 m/s; 07/18/19: self-selected: 26.5s = 0.38 m/s; 08/17/19: 21.7s = 0.46 m/s    Time 12    Period Weeks    Status On-going    Target Date 08/31/19                 Plan - 08/24/19 1710    Clinical Impression Statement In general, patient demonstrating good tolerance to therapy session this date, reasonable accommodations are alllowed in-session to allow adequate rest between activities as needed, pt reports to be somewhat tired on arrival and after each intervention. All interventions executed without any exacerbation of pain or other symptoms. Pt struggles with isolated Lt knee extension due to chronic joint range limitations and well establish compensated movement patterns, but responds well to tactile cues for correction. Pt given intermittent multimodal cues to teach best possible form with exercises. Pt continues to make steady progress toward most goals. No home  exercise updates made at this time. SPC used to navigate between stations in session.    Personal Factors and Comorbidities Age;Comorbidity 3+;Past/Current Experience;Time since onset of injury/illness/exacerbation    Comorbidities DM, CVA, MCI, OA    Examination-Activity Limitations Caring for Others;Carry;Lift;Locomotion Level;Squat;Stairs;Stand;Transfers    Examination-Participation Restrictions Cleaning;Community Activity;Laundry;Medication  Management;Meal Prep;Shop    Stability/Clinical Decision Making Stable/Uncomplicated    Clinical Decision Making High    Rehab Potential Fair    PT Frequency 2x / week    PT Duration 12 weeks    PT Treatment/Interventions ADLs/Self Care Home Management;Aquatic Therapy;Biofeedback;Canalith Repostioning;Cryotherapy;Electrical Stimulation;Iontophoresis 4mg /ml Dexamethasone;Moist Heat;Traction;Ultrasound;DME Instruction;Gait training;Stair training;Functional mobility training;Therapeutic activities;Therapeutic exercise;Balance training;Neuromuscular re-education;Cognitive remediation;Patient/family education;Manual techniques;Passive range of motion;Dry needling;Taping;Vestibular;Spinal Manipulations;Joint Manipulations    PT Next Visit Plan Progress balance and strengthening. Discharge on 08/31/19 visit    PT [place] Access Code: [email or web address]    Consulted and Agree with Plan of Care Patient;Family member/caregiver    Family Member Consulted :Local DTR           Patient will benefit from skilled therapeutic intervention in order to improve the following deficits and impairments:  Abnormal gait, Decreased balance, Decreased mobility, Decreased strength, Difficulty walking, Pain  Visit Diagnosis: Unsteadiness on feet  Difficulty in walking, not elsewhere classified  Muscle weakness (generalized)     Problem List Patient Active Problem List   Diagnosis Date Noted  . MCI (mild cognitive impairment) 05/13/2019  . Anemia 08/12/2017   . Weight loss 08/10/2017  . Health maintenance examination 01/26/2016  . Advanced care planning/counseling discussion 01/26/2016  . Insomnia 01/26/2016  . Iron deficiency 01/26/2016  . Carotid stenosis 10/25/2015  . Osteopenia 10/25/2015  . Primary osteoarthritis of both knees 10/24/2015  . Type 2 diabetes mellitus with complication, with long-term current use of insulin ([place]) 10/24/2015  . Corns and callus 10/24/2015  . Dyslipidemia   . Hypothyroidism   . History of ischemic stroke without residual deficits 03/03/2012  . Atrial fibrillation ([place]) 03/03/2012   5:40 PM, 08/24/19 Etta Grandchild, PT, DPT Physical Therapist - [facility]  [phone] ([address])    [place] [facility] 08/24/2019, 5:34 PM  [place] MAIN [facility] SERVICES [address] [place], [place], [address] Phone: [phone]   Fax:  [phone]  Name: Lanasia Porras MRN: [id number] Date of Birth: [birth year]

## 2019-08-29 ENCOUNTER — Ambulatory Visit: Payer: 59

## 2019-08-31 ENCOUNTER — Ambulatory Visit: Payer: 59

## 2019-08-31 ENCOUNTER — Other Ambulatory Visit: Payer: Self-pay

## 2019-08-31 DIAGNOSIS — R2681 Unsteadiness on feet: Secondary | ICD-10-CM | POA: Diagnosis not present

## 2019-08-31 DIAGNOSIS — M6281 Muscle weakness (generalized): Secondary | ICD-10-CM

## 2019-08-31 DIAGNOSIS — R262 Difficulty in walking, not elsewhere classified: Secondary | ICD-10-CM

## 2019-08-31 NOTE — Therapy (Signed)
[facility] MAIN [facility] SERVICES [address] [place], [place], [address] Phone: [phone]   Fax:  [phone]  Physical Therapy Treatment/Discharge  Patient Details  Name: [name] MRN: [id number] Date of Birth: 02-Apr-1934 Referring Provider (PT): Bud Face   Encounter Date: 08/31/2019   PT End of Session - 08/31/19 1648    Visit Number 16    Number of Visits 25    Date for PT Re-Evaluation 08/31/19    Authorization Type eval: 06/08/19    PT Start Time 1645    PT Stop Time 1730    PT Time Calculation (min) 45 min    Equipment Utilized During Treatment Gait belt    Activity Tolerance Patient tolerated treatment well;No increased pain    Behavior During Therapy WFL for tasks assessed/performed           Past Medical History:  Diagnosis Date  . Allergy   . Atrial fibrillation (HCC) 2014   presumed as on xarelto after CVA 2014  . Bilateral primary osteoarthritis of knee    severe by xrays  . Carotid stenosis 10/25/2015   Mild by [place] 2014  . Diabetes mellitus without complication (HCC) 1994  . Heart disease   . Hepatitis    age 69  . HLD (hyperlipidemia)   . Hypertension   . Hypothyroidism   . Ischemic stroke (HCC) 2014   R basal ganglion infarct with L hemiparesis 2014, now largely resolved, uses cane  . Rheumatic fever   . Wears dentures    upper and lower    Past Surgical History:  Procedure Laterality Date  . CATARACT EXTRACTION W/PHACO Right 12/25/2015   Procedure: CATARACT EXTRACTION PHACO AND INTRAOCULAR LENS PLACEMENT (IOC);  Surgeon: Nevada Crane, MD;  Location: [facility];  Service: Ophthalmology;  Laterality: Right;  DIABETIC NEEDS INTERPRETER RIGHT  . COLONOSCOPY  2013   per patient WNL ([place])  . TOTAL ABDOMINAL HYSTERECTOMY W/ BILATERAL SALPINGOOPHORECTOMY  1972    There were no vitals filed for this visit.   Subjective Assessment - 08/31/19 1643    Subjective Patient presents with  daughter, declines use of interpreter. Reports continued bilateral knee pain with activity but none at rest. She continues performing stationary bike some at home as well as resistance band strengthening.. No specific questions or concerns currently.    Pertinent History Pt referred for physical therapy for falls and poor balance. Daughter assists with history and reports worsening balance since her CVA 4 years ago. She was in [place] when she had her stroke and daughter reports that pt was not very fond of the therapy believing it was not very helpful. Daughter states that pt has had 3 falls in the last 6 months the last one that occurred on 05/12/19. She has struck her head during a couple falls with bruising noted. Pt is on blood thinners and there is a concern for repeated falls. She uses a wide base single point cane for ambulation in the house and a rollator when out in the community.    Limitations Walking    Patient Stated Goals Daughter would like to improve patients strength and balance    Currently in Pain? No/denies               TREATMENT   Ther-ex NuStep L2 x 5 minutes for warm-up during history (4 minutes unbilled); Supine SLR with 4# ankle weights (AW) 2 x 15 bilateral; Hooklying bridges 2 x 15; Hooklying clams with  manual resistance 3s hold 2 x 15 bilateral; Hooklying adductor squeeze with manual resistance 3s hold 2 x 15 bilateral;  Supine manually resisted leg press 2 x 15 BLE; Sidelying hip abduction SLR, no ankle weights x 15 BLE;  Seated exercises with 4# ankle weights (AW); Marches x 20 BLE; LAQ x 20 BLE;  Sit to stand with BUE handheld support 2 x 10, cues for foot placement to minimize knee pain; Standing marches with 4# AW x 10;   Pt educated throughout session about proper posture and technique with exercises. Improved exercise technique, movement at target joints, use of target muscles after min to mod verbal, visual, tactile cues.   Updated outcome  measures and goals with patient during last session. She demonstrated improvement in her outcome measures since they were last updated however they are essentially unchanged since the initial evaluation. Her BERG had remained unchanged at 36/56 and her ABC was also essentially unchanged going from 0% at initial evaluation to 1.3% previously and 3.1% last visit. With respect to self-report measures they were completed by her daughter at last session. Pt has been reporting continued bilateral knee pain and this has been a significant limitation for her therapy sessions. Her MD office is still trying to get the knee injections improved by the insurance company. Provided emphasis to patient and daughter regarding importance of continued strengthening/conditioning at home. Reprinted her HEP today. Pt will be discharged on this date.              PT Short Term Goals - 08/31/19 1649      PT SHORT TERM GOAL #1   Title Pt and daughter will be independent with HEP in order to improve strength and balance in order to decrease fall risk and improve function at home.    Time 6    Period Weeks    Status Achieved    Target Date 07/21/19             PT Long Term Goals - 08/31/19 1649      PT LONG TERM GOAL #1   Title Pt will improve BERG by at least 3 points in order to demonstrate clinically significant improvement in balance.    Baseline 06/08/19: 36/56; 07/18/19: 36/56; 08/17/19: 36/56    Time 12    Period Weeks    Status On-going      PT LONG TERM GOAL #2   Title Pt will increase FOTO to at least 54 in order to demonstrate clinically significant improvement in function at home    Baseline 06/08/19: 36; 07/18/19: 23; 08/17/19: 12    Time 12    Period Weeks    Status On-going      PT LONG TERM GOAL #3   Title Pt will decrease 5TSTS to below 12 seconds in order to demonstrate clinically significant improvement in LE strength.    Baseline 06/08/19: 14.6s; 07/18/19: 21.0s; 08/17/19: 13.2s    Time 12     Period Weeks    Status On-going      PT LONG TERM GOAL #4   Title Pt will decrease TUG to below 14 seconds/decrease in order to demonstrate decreased fall risk.    Baseline 06/08/19: 17.9s; 07/19/19: 28.2s; 08/17/19: 21.3s    Time 12    Period Weeks    Status On-going      PT LONG TERM GOAL #5   Title Pt will increase self-selected by at least 0.13 m/s in order to demonstrate clinically significant improvement  in community ambulation.    Baseline 06/08/19: self-selected: 16.3s = 0.61 m/s; 07/18/19: self-selected: 26.5s = 0.38 m/s; 08/17/19: 21.7s = 0.46 m/s    Time 12    Period Weeks    Status On-going                 Plan - 08/31/19 1648    Clinical Impression Statement Updated outcome measures and goals with patient during last session. She demonstrated improvement in her outcome measures since they were last updated however they are essentially unchanged since the initial evaluation. Her BERG had remained unchanged at 36/56 and her ABC was also essentially unchanged going from 0% at initial evaluation to 1.3% previously and 3.1% last visit. With respect to self-report measures they were completed by her daughter at last session. Pt has been reporting continued bilateral knee pain and this has been a significant limitation for her therapy sessions. Her MD office is still trying to get the knee injections improved by the insurance company. Provided emphasis to patient and daughter regarding importance of continued strengthening/conditioning at home. Reprinted her HEP today. Pt will be discharged on this date.    Personal Factors and Comorbidities Age;Comorbidity 3+;Past/Current Experience;Time since onset of injury/illness/exacerbation    Comorbidities DM, CVA, MCI, OA    Examination-Activity Limitations Caring for Others;Carry;Lift;Locomotion Level;Squat;Stairs;Stand;Transfers    Examination-Participation Restrictions Cleaning;Community Activity;Laundry;Medication Management;Meal  Prep;Shop    Stability/Clinical Decision Making Unstable/Unpredictable    Rehab Potential Fair    PT Frequency 2x / week    PT Duration 12 weeks    PT Treatment/Interventions ADLs/Self Care Home Management;Aquatic Therapy;Biofeedback;Canalith Repostioning;Cryotherapy;Electrical Stimulation;Iontophoresis 4mg /ml Dexamethasone;Moist Heat;Traction;Ultrasound;DME Instruction;Gait training;Stair training;Functional mobility training;Therapeutic activities;Therapeutic exercise;Balance training;Neuromuscular re-education;Cognitive remediation;Patient/family education;Manual techniques;Passive range of motion;Dry needling;Taping;Vestibular;Spinal Manipulations;Joint Manipulations    PT Next Visit Plan Progress balance and strengthening. Discharge on 08/31/19 visit    PT Home Exercise Plan [employer] Access Code: [email or web address]    Consulted and Agree with Plan of Care Patient           Patient will benefit from skilled therapeutic intervention in order to improve the following deficits and impairments:  Abnormal gait, Decreased balance, Decreased mobility, Decreased strength, Difficulty walking, Pain  Visit Diagnosis: Unsteadiness on feet  Difficulty in walking, not elsewhere classified  Muscle weakness (generalized)     Problem List Patient Active Problem List   Diagnosis Date Noted  . MCI (mild cognitive impairment) 05/13/2019  . Anemia 08/12/2017  . Weight loss 08/10/2017  . Health maintenance examination 01/26/2016  . Advanced care planning/counseling discussion 01/26/2016  . Insomnia 01/26/2016  . Iron deficiency 01/26/2016  . Carotid stenosis 10/25/2015  . Osteopenia 10/25/2015  . Primary osteoarthritis of both knees 10/24/2015  . Type 2 diabetes mellitus with complication, with long-term current use of insulin (HCC) 10/24/2015  . Corns and callus 10/24/2015  . Dyslipidemia   . Hypothyroidism   . History of ischemic stroke without residual deficits 03/03/2012  . Atrial fibrillation  (HCC) 03/03/2012   05/01/2012 PT, DPT, [facility]  Karely Hurtado 08/31/2019, 5:39 PM  [place] [facility] MAIN [facility] SERVICES [address] [place], [place], [place] Phone: [phone]   Fax:  [phone]  Name: [name] MRN: Domingo Pulse Date of Birth: 1934-12-07

## 2019-09-13 ENCOUNTER — Ambulatory Visit (INDEPENDENT_AMBULATORY_CARE_PROVIDER_SITE_OTHER): Payer: 59 | Admitting: Podiatry

## 2019-09-13 ENCOUNTER — Other Ambulatory Visit: Payer: Self-pay

## 2019-09-13 DIAGNOSIS — B351 Tinea unguium: Secondary | ICD-10-CM | POA: Diagnosis not present

## 2019-09-13 DIAGNOSIS — D689 Coagulation defect, unspecified: Secondary | ICD-10-CM

## 2019-09-13 DIAGNOSIS — M79674 Pain in right toe(s): Secondary | ICD-10-CM | POA: Diagnosis not present

## 2019-09-13 DIAGNOSIS — M79675 Pain in left toe(s): Secondary | ICD-10-CM

## 2019-09-14 ENCOUNTER — Other Ambulatory Visit: Payer: Self-pay | Admitting: Family Medicine

## 2019-09-14 ENCOUNTER — Encounter: Payer: Self-pay | Admitting: Podiatry

## 2019-09-14 NOTE — Progress Notes (Signed)
  Subjective:  Patient ID: [name], female    DOB: 1934/06/06,  MRN: [id number]  No chief complaint on file.  85 y.o. female returns for the above complaint.  Patient presents with thickened elongated mycotic toenails x10.  Patient says the painful when ambulating on.  Patient is currently on Xarelto.  Patient has tried self debridement but worried about bleeding if she accidentally cuts her skin.  She denies any other acute complaints.  She would like to have them debrided down.  Objective:  There were no vitals filed for this visit. Podiatric Exam: Vascular: dorsalis pedis and posterior tibial pulses are palpable bilateral. Capillary return is immediate. Temperature gradient is WNL. Skin turgor WNL  Sensorium: Normal Semmes Weinstein monofilament test. Normal tactile sensation bilaterally. Nail Exam: Pt has thick disfigured discolored nails with subungual debris noted bilateral entire nail hallux through fifth toenails Ulcer Exam: There is no evidence of ulcer or pre-ulcerative changes or infection. Orthopedic Exam: Muscle tone and strength are WNL. No limitations in general ROM. No crepitus or effusions noted. HAV  B/L.  Hammer toes 2-5  B/L. Skin: No Porokeratosis. No infection or ulcers  Assessment & Plan:  Patient was evaluated and treated and all questions answered.  Onychomycosis with pain  -Nails palliatively debrided as below. -Educated on self-care  Procedure: Nail Debridement Rationale: pain  Type of Debridement: manual, sharp debridement. Instrumentation: Nail nipper, rotary burr. Number of Nails: 10  Procedures and Treatment: Consent by patient was obtained for treatment procedures. The patient understood the discussion of treatment and procedures well. All questions were answered thoroughly reviewed. Debridement of mycotic and hypertrophic toenails, 1 through 5 bilateral and clearing of subungual debris. No ulceration, no infection noted.  Return Visit-Office  Procedure: Patient instructed to return to the office for a follow up visit 3 months for continued evaluation and treatment.  Nicholes Rough, DPM    No follow-ups on file.

## 2019-09-14 NOTE — Telephone Encounter (Signed)
E-scribed refill.  Plz schedule cpe and lab visits.  

## 2019-09-15 ENCOUNTER — Telehealth: Payer: Self-pay

## 2019-09-15 NOTE — Telephone Encounter (Signed)
Patient's daughter contacted the office and states that the patient is leaving on Tuesday for [place] to stay with family for 6 months. She is wondering if we can work her in for this physical prior to her leaving? Dr. Reece Agar, please advise?

## 2019-09-15 NOTE — Telephone Encounter (Signed)
I left message for patient's daughter [name] to return phone call.

## 2019-09-15 NOTE — Telephone Encounter (Addendum)
We could see tomorrow at 7:30am or Mon at 3:30pm.  Would want fasting labs when she comes in (4 hour fast).

## 2019-09-15 NOTE — Telephone Encounter (Signed)
Called patient and left voicemail for her to call the office and get scheduled for cpe and labs.

## 2019-09-18 ENCOUNTER — Other Ambulatory Visit: Payer: Self-pay | Admitting: Family Medicine

## 2019-09-18 ENCOUNTER — Encounter: Payer: Self-pay | Admitting: Family Medicine

## 2019-09-19 ENCOUNTER — Telehealth: Payer: Self-pay | Admitting: Family Medicine

## 2019-09-19 DIAGNOSIS — E785 Hyperlipidemia, unspecified: Secondary | ICD-10-CM

## 2019-09-19 DIAGNOSIS — I480 Paroxysmal atrial fibrillation: Secondary | ICD-10-CM

## 2019-09-19 DIAGNOSIS — E039 Hypothyroidism, unspecified: Secondary | ICD-10-CM

## 2019-09-19 DIAGNOSIS — D649 Anemia, unspecified: Secondary | ICD-10-CM

## 2019-09-19 DIAGNOSIS — E118 Type 2 diabetes mellitus with unspecified complications: Secondary | ICD-10-CM

## 2019-09-19 MED ORDER — LANTUS SOLOSTAR 100 UNIT/ML ~~LOC~~ SOPN: 8.0000 [IU] | PEN_INJECTOR | Freq: Every day | SUBCUTANEOUS | 3 refills | Status: DC | PRN

## 2019-09-19 NOTE — Telephone Encounter (Signed)
Spoke with pt's daughter, [name] (on dpr), relaying Dr. Timoteo Expose message.  Verbalizes understanding and schedule lab visit tomorrow at 8:35.  She will call back to schedule a virtual visit for pt.

## 2019-09-19 NOTE — Telephone Encounter (Signed)
Patient's daughter [name] called wanting her mom to get scheduled for CPE and labs before her mom moves for 6 months tomorrow morning. I informed her that theres now no more availability for today and Dr.G will be out tomorrow so theres no available slots to get her scheduled today. She now wants to see if patient could get labs done today and get scheduled for a virtual CPE in the next few weeks. [name] also wanted me to state that she never received any voice mails from [place] regardless of the 3 that were left.

## 2019-09-19 NOTE — Addendum Note (Signed)
Addended by: Eustaquio Boyden on: 09/19/2019 03:36 PM   Modules accepted: Orders

## 2019-09-19 NOTE — Telephone Encounter (Signed)
I left 2nd message for patient's daughter [name] to return phone call to see if pt could come today at 3:30.

## 2019-09-19 NOTE — Telephone Encounter (Signed)
Please have her come in tomorrow for fasting labs.  plz go over our lab visit protocol as it may have changed since last seen.  May schedule virtual physical when she can although may not be covered by her insurance if out of state - may have her check prior to completing.

## 2019-09-19 NOTE — Telephone Encounter (Signed)
E-scribed refill 

## 2019-09-20 ENCOUNTER — Other Ambulatory Visit (INDEPENDENT_AMBULATORY_CARE_PROVIDER_SITE_OTHER): Payer: 59

## 2019-09-20 ENCOUNTER — Other Ambulatory Visit: Payer: Self-pay

## 2019-09-20 DIAGNOSIS — D649 Anemia, unspecified: Secondary | ICD-10-CM | POA: Diagnosis not present

## 2019-09-20 DIAGNOSIS — Z794 Long term (current) use of insulin: Secondary | ICD-10-CM | POA: Diagnosis not present

## 2019-09-20 DIAGNOSIS — I480 Paroxysmal atrial fibrillation: Secondary | ICD-10-CM

## 2019-09-20 DIAGNOSIS — E039 Hypothyroidism, unspecified: Secondary | ICD-10-CM | POA: Diagnosis not present

## 2019-09-20 DIAGNOSIS — E118 Type 2 diabetes mellitus with unspecified complications: Secondary | ICD-10-CM | POA: Diagnosis not present

## 2019-09-20 DIAGNOSIS — E785 Hyperlipidemia, unspecified: Secondary | ICD-10-CM | POA: Diagnosis not present

## 2019-09-20 LAB — CBC WITH DIFFERENTIAL/PLATELET
Basophils Absolute: 0.1 10*3/uL (ref 0.0–0.1)
Basophils Relative: 1.2 % (ref 0.0–3.0)
Eosinophils Absolute: 0.2 10*3/uL (ref 0.0–0.7)
Eosinophils Relative: 2.7 % (ref 0.0–5.0)
HCT: 32.4 % — ABNORMAL LOW (ref 36.0–46.0)
Hemoglobin: 10.5 g/dL — ABNORMAL LOW (ref 12.0–15.0)
Lymphocytes Relative: 27.3 % (ref 12.0–46.0)
Lymphs Abs: 1.6 10*3/uL (ref 0.7–4.0)
MCHC: 32.4 g/dL (ref 30.0–36.0)
MCV: 79.8 fl (ref 78.0–100.0)
Monocytes Absolute: 0.5 10*3/uL (ref 0.1–1.0)
Monocytes Relative: 8.6 % (ref 3.0–12.0)
Neutro Abs: 3.4 10*3/uL (ref 1.4–7.7)
Neutrophils Relative %: 60.2 % (ref 43.0–77.0)
Platelets: 223 10*3/uL (ref 150.0–400.0)
RBC: 4.06 Mil/uL (ref 3.87–5.11)
RDW: 14.6 % (ref 11.5–15.5)
WBC: 5.7 10*3/uL (ref 4.0–10.5)

## 2019-09-20 LAB — COMPREHENSIVE METABOLIC PANEL
ALT: 9 U/L (ref 0–35)
AST: 14 U/L (ref 0–37)
Albumin: 4.1 g/dL (ref 3.5–5.2)
Alkaline Phosphatase: 51 U/L (ref 39–117)
BUN: 17 mg/dL (ref 6–23)
CO2: 27 mEq/L (ref 19–32)
Calcium: 9 mg/dL (ref 8.4–10.5)
Chloride: 101 mEq/L (ref 96–112)
Creatinine, Ser: 0.85 mg/dL (ref 0.40–1.20)
GFR: 63.89 mL/min (ref >60.00)
Glucose, Bld: 112 mg/dL — ABNORMAL HIGH (ref 70–99)
Potassium: 4.2 mEq/L (ref 3.5–5.1)
Sodium: 134 mEq/L — ABNORMAL LOW (ref 135–145)
Total Bilirubin: 0.4 mg/dL (ref 0.2–1.2)
Total Protein: 6.7 g/dL (ref 6.0–8.3)

## 2019-09-20 LAB — HEMOGLOBIN A1C: Hgb A1c MFr Bld: 7 % — ABNORMAL HIGH (ref 4.6–6.5)

## 2019-09-20 LAB — LIPID PANEL
Cholesterol: 132 mg/dL (ref 0–200)
HDL: 43.7 mg/dL (ref >39.00)
LDL Cholesterol: 74 mg/dL (ref 0–99)
NonHDL: 88.24
Total CHOL/HDL Ratio: 3
Triglycerides: 72 mg/dL (ref 0.0–149.0)
VLDL: 14.4 mg/dL (ref 0.0–40.0)

## 2019-09-20 LAB — VITAMIN B12: Vitamin B-12: >1526 pg/mL — ABNORMAL HIGH (ref 211–911)

## 2019-09-20 LAB — TSH: TSH: 1.09 u[IU]/mL (ref 0.35–4.50)

## 2019-09-20 LAB — IBC PANEL
Iron: 36 ug/dL — ABNORMAL LOW (ref 42–145)
Saturation Ratios: 7.8 % — ABNORMAL LOW (ref 20.0–50.0)
Transferrin: 328 mg/dL (ref 212.0–360.0)

## 2019-09-20 LAB — FERRITIN: Ferritin: 5.8 ng/mL — ABNORMAL LOW (ref 10.0–291.0)

## 2019-09-20 LAB — FOLATE: Folate: >24.8 ng/mL (ref >5.9)

## 2019-09-20 NOTE — Addendum Note (Signed)
Addended by: Aquilla Solian on: 09/20/2019 04:41 PM   Modules accepted: Orders

## 2019-09-21 LAB — MICROALBUMIN / CREATININE URINE RATIO
Creatinine,U: 102.1 mg/dL
Microalb Creat Ratio: 0.8 mg/g (ref 0.0–30.0)
Microalb, Ur: 0.9 mg/dL (ref 0.0–1.9)

## 2019-09-23 ENCOUNTER — Other Ambulatory Visit: Payer: Self-pay

## 2019-09-23 DIAGNOSIS — E118 Type 2 diabetes mellitus with unspecified complications: Secondary | ICD-10-CM

## 2019-09-23 MED ORDER — BD PEN NEEDLE NANO 2ND GEN 32G X 4 MM MISC: 1 refills | Status: DC

## 2019-09-23 NOTE — Telephone Encounter (Signed)
Received faxed refill request from [facility] in [address]  Odis Luster refill.

## 2019-11-03 ENCOUNTER — Encounter: Payer: Self-pay | Admitting: Family Medicine

## 2019-12-11 ENCOUNTER — Other Ambulatory Visit: Payer: Self-pay | Admitting: Family Medicine

## 2019-12-12 NOTE — Telephone Encounter (Signed)
E-scribed refill.  Plz schedule cpe and lab visits.  

## 2019-12-13 NOTE — Telephone Encounter (Signed)
Ok to do then. Thank you.

## 2019-12-13 NOTE — Telephone Encounter (Signed)
Called patient. Daughter, [name], picked up. Scheduled cpe as daughter said labs were already done before she left for [place] to be with other daughter. Patient wanted to inform PCP that she did also speak with insurance and they stated there is no issue with doing virtual cpe's as well. Please advise.

## 2020-03-06 ENCOUNTER — Other Ambulatory Visit: Payer: Self-pay | Admitting: Family Medicine

## 2020-03-26 ENCOUNTER — Encounter: Payer: 59 | Admitting: Family Medicine

## 2020-04-17 ENCOUNTER — Encounter: Payer: Self-pay | Admitting: Family Medicine

## 2020-04-17 NOTE — Telephone Encounter (Signed)
I believe this was meant for you and Dr Patsy Lager??

## 2020-04-18 NOTE — Telephone Encounter (Signed)
I don't know how to handle this.  Can you all help? Coverage and cost to patient.  I am not sure about the medicaid question either?

## 2020-04-19 ENCOUNTER — Other Ambulatory Visit: Payer: Self-pay | Admitting: Family Medicine

## 2020-04-19 DIAGNOSIS — E785 Hyperlipidemia, unspecified: Secondary | ICD-10-CM

## 2020-04-19 DIAGNOSIS — E611 Iron deficiency: Secondary | ICD-10-CM

## 2020-04-19 DIAGNOSIS — E118 Type 2 diabetes mellitus with unspecified complications: Secondary | ICD-10-CM

## 2020-04-19 DIAGNOSIS — Z794 Long term (current) use of insulin: Secondary | ICD-10-CM

## 2020-04-19 DIAGNOSIS — I480 Paroxysmal atrial fibrillation: Secondary | ICD-10-CM

## 2020-04-19 DIAGNOSIS — E039 Hypothyroidism, unspecified: Secondary | ICD-10-CM

## 2020-04-19 DIAGNOSIS — D649 Anemia, unspecified: Secondary | ICD-10-CM

## 2020-04-19 NOTE — Telephone Encounter (Signed)
This nurse will look into this and contact pt with information.

## 2020-04-20 ENCOUNTER — Encounter: Payer: Self-pay | Admitting: Family Medicine

## 2020-04-20 ENCOUNTER — Other Ambulatory Visit (INDEPENDENT_AMBULATORY_CARE_PROVIDER_SITE_OTHER): Payer: 59

## 2020-04-20 ENCOUNTER — Ambulatory Visit (INDEPENDENT_AMBULATORY_CARE_PROVIDER_SITE_OTHER): Payer: 59 | Admitting: Family Medicine

## 2020-04-20 ENCOUNTER — Other Ambulatory Visit: Payer: Self-pay

## 2020-04-20 ENCOUNTER — Other Ambulatory Visit: Payer: Self-pay | Admitting: Family Medicine

## 2020-04-20 VITALS — BP 124/68 | HR 82 | Temp 98.5°F | Ht 62.0 in | Wt 137.2 lb

## 2020-04-20 DIAGNOSIS — Z794 Long term (current) use of insulin: Secondary | ICD-10-CM | POA: Diagnosis not present

## 2020-04-20 DIAGNOSIS — E785 Hyperlipidemia, unspecified: Secondary | ICD-10-CM | POA: Diagnosis not present

## 2020-04-20 DIAGNOSIS — E118 Type 2 diabetes mellitus with unspecified complications: Secondary | ICD-10-CM

## 2020-04-20 DIAGNOSIS — E039 Hypothyroidism, unspecified: Secondary | ICD-10-CM

## 2020-04-20 DIAGNOSIS — D649 Anemia, unspecified: Secondary | ICD-10-CM | POA: Diagnosis not present

## 2020-04-20 DIAGNOSIS — R296 Repeated falls: Secondary | ICD-10-CM | POA: Insufficient documentation

## 2020-04-20 DIAGNOSIS — F5101 Primary insomnia: Secondary | ICD-10-CM

## 2020-04-20 DIAGNOSIS — E611 Iron deficiency: Secondary | ICD-10-CM

## 2020-04-20 DIAGNOSIS — G3184 Mild cognitive impairment, so stated: Secondary | ICD-10-CM

## 2020-04-20 DIAGNOSIS — Z0001 Encounter for general adult medical examination with abnormal findings: Secondary | ICD-10-CM

## 2020-04-20 DIAGNOSIS — M170 Bilateral primary osteoarthritis of knee: Secondary | ICD-10-CM | POA: Diagnosis not present

## 2020-04-20 DIAGNOSIS — R3915 Urgency of urination: Secondary | ICD-10-CM | POA: Insufficient documentation

## 2020-04-20 DIAGNOSIS — Z8673 Personal history of transient ischemic attack (TIA), and cerebral infarction without residual deficits: Secondary | ICD-10-CM

## 2020-04-20 DIAGNOSIS — I480 Paroxysmal atrial fibrillation: Secondary | ICD-10-CM

## 2020-04-20 DIAGNOSIS — R634 Abnormal weight loss: Secondary | ICD-10-CM

## 2020-04-20 LAB — FERRITIN: Ferritin: 12.9 ng/mL (ref 10.0–291.0)

## 2020-04-20 LAB — POC URINALSYSI DIPSTICK (AUTOMATED)
Bilirubin, UA: NEGATIVE
Glucose, UA: NEGATIVE
Nitrite, UA: NEGATIVE
Protein, UA: NEGATIVE
Spec Grav, UA: >=1.03 — AB (ref 1.010–1.025)
Urobilinogen, UA: 0.2 E.U./dL
pH, UA: 6 (ref 5.0–8.0)

## 2020-04-20 LAB — CBC WITH DIFFERENTIAL/PLATELET
Basophils Absolute: 0.1 10*3/uL (ref 0.0–0.1)
Basophils Relative: 0.7 % (ref 0.0–3.0)
Eosinophils Absolute: 0.1 10*3/uL (ref 0.0–0.7)
Eosinophils Relative: 1.8 % (ref 0.0–5.0)
HCT: 30.2 % — ABNORMAL LOW (ref 36.0–46.0)
Hemoglobin: 10.3 g/dL — ABNORMAL LOW (ref 12.0–15.0)
Lymphocytes Relative: 23.2 % (ref 12.0–46.0)
Lymphs Abs: 1.6 10*3/uL (ref 0.7–4.0)
MCHC: 34.1 g/dL (ref 30.0–36.0)
MCV: 80.3 fl (ref 78.0–100.0)
Monocytes Absolute: 0.5 10*3/uL (ref 0.1–1.0)
Monocytes Relative: 7.8 % (ref 3.0–12.0)
Neutro Abs: 4.7 10*3/uL (ref 1.4–7.7)
Neutrophils Relative %: 66.5 % (ref 43.0–77.0)
Platelets: 212 10*3/uL (ref 150.0–400.0)
RBC: 3.76 Mil/uL — ABNORMAL LOW (ref 3.87–5.11)
RDW: 16.6 % — ABNORMAL HIGH (ref 11.5–15.5)
WBC: 7 10*3/uL (ref 4.0–10.5)

## 2020-04-20 LAB — MICROALBUMIN / CREATININE URINE RATIO
Creatinine,U: 91.1 mg/dL
Microalb Creat Ratio: 0.8 mg/g (ref 0.0–30.0)
Microalb, Ur: 0.7 mg/dL (ref 0.0–1.9)

## 2020-04-20 LAB — LIPID PANEL
Cholesterol: 129 mg/dL (ref 0–200)
HDL: 40.9 mg/dL (ref >39.00)
LDL Cholesterol: 70 mg/dL (ref 0–99)
NonHDL: 88.56
Total CHOL/HDL Ratio: 3
Triglycerides: 95 mg/dL (ref 0.0–149.0)
VLDL: 19 mg/dL (ref 0.0–40.0)

## 2020-04-20 LAB — TSH: TSH: 6.97 u[IU]/mL — ABNORMAL HIGH (ref 0.35–4.50)

## 2020-04-20 LAB — COMPREHENSIVE METABOLIC PANEL
ALT: 8 U/L (ref 0–35)
AST: 14 U/L (ref 0–37)
Albumin: 3.9 g/dL (ref 3.5–5.2)
Alkaline Phosphatase: 48 U/L (ref 39–117)
BUN: 13 mg/dL (ref 6–23)
CO2: 28 mEq/L (ref 19–32)
Calcium: 9 mg/dL (ref 8.4–10.5)
Chloride: 98 mEq/L (ref 96–112)
Creatinine, Ser: 0.91 mg/dL (ref 0.40–1.20)
GFR: 58.36 mL/min — ABNORMAL LOW (ref >60.00)
Glucose, Bld: 90 mg/dL (ref 70–99)
Potassium: 4.1 mEq/L (ref 3.5–5.1)
Sodium: 132 mEq/L — ABNORMAL LOW (ref 135–145)
Total Bilirubin: 0.5 mg/dL (ref 0.2–1.2)
Total Protein: 6.6 g/dL (ref 6.0–8.3)

## 2020-04-20 LAB — IBC PANEL
Iron: 41 ug/dL — ABNORMAL LOW (ref 42–145)
Saturation Ratios: 10.6 % — ABNORMAL LOW (ref 20.0–50.0)
Transferrin: 276 mg/dL (ref 212.0–360.0)

## 2020-04-20 LAB — VITAMIN B12: Vitamin B-12: >1506 pg/mL — ABNORMAL HIGH (ref 211–911)

## 2020-04-20 LAB — FOLATE: Folate: 18.1 ng/mL (ref >5.9)

## 2020-04-20 LAB — HEMOGLOBIN A1C: Hgb A1c MFr Bld: 7 % — ABNORMAL HIGH (ref 4.6–6.5)

## 2020-04-20 MED ORDER — POLYETHYLENE GLYCOL 3350 17 GM/SCOOP PO POWD: 17.0000 g | Freq: Every day | ORAL | 1 refills | Status: DC | PRN

## 2020-04-20 MED ORDER — POLYETHYLENE GLYCOL 3350 17 GM/SCOOP PO POWD: 17.0000 g | Freq: Every day | ORAL | Status: DC | PRN

## 2020-04-20 MED ORDER — METFORMIN HCL 500 MG PO TABS: 500.0000 mg | ORAL_TABLET | Freq: Every day | ORAL | 3 refills | Status: DC

## 2020-04-20 MED ORDER — RIVAROXABAN 20 MG PO TABS: 20.0000 mg | ORAL_TABLET | Freq: Every day | ORAL | 3 refills | Status: DC

## 2020-04-20 MED ORDER — ATENOLOL 25 MG PO TABS: 25.0000 mg | ORAL_TABLET | Freq: Every day | ORAL | 3 refills | Status: DC

## 2020-04-20 MED ORDER — B COMPLEX VITAMINS PO CAPS: 1.0000 | ORAL_CAPSULE | ORAL | Status: DC

## 2020-04-20 MED ORDER — LEVOTHYROXINE SODIUM 75 MCG PO TABS: 75.0000 ug | ORAL_TABLET | Freq: Every day | ORAL | 3 refills | Status: DC

## 2020-04-20 NOTE — Assessment & Plan Note (Signed)
Continue xarelto

## 2020-04-20 NOTE — Assessment & Plan Note (Signed)
Check UA/micro and UCx.

## 2020-04-20 NOTE — Progress Notes (Signed)
Patient ID: [name] Robinette, female    DOB: 05/17/1934, 86 y.o.   MRN: [id number]  This visit was conducted in person.  BP 124/68   Pulse 82   Temp 98.5 F (36.9 C) (Temporal)   Ht 5\' 2"  (1.575 m)   Wt 137 lb 3 oz (62.2 kg)   SpO2 98%   BMI 25.09 kg/m    CC: CPE Subjective:   HPI: [name] [place]atte is a 86 y.o. female presenting on 04/20/2020 for Annual Exam (Pt accompanied by daughter, Vernona RiegerLaura- temp 98.1.)   Did not see health advisor.  Recently qualified for medicaid.  Other daughter is also here.    Hearing Screening   125Hz  250Hz  500Hz  1000Hz  2000Hz  3000Hz  4000Hz  6000Hz  8000Hz   Right ear:   40 40 20  0    Left ear:   20 40 25  0    Comments: Last hearing exam, 05/2019.   Visual Acuity Screening   Right eye Left eye Both eyes  Without correction: 20/40 20/100 20/30  With correction:         No flowsheet data found.    Had a fall this week - slipped while walking using walker. Has had 3 falls in 2021. She is currently undergoing fall prevention program with outpatient PT.   Last seen 09/2018 (virtual visit) Spends part of year with son in [place], part of the time with daughter in [place], other part of year with daughter in [place].   Saw cardiology Dr Lady GaryFath 09/2019 - persistent afib and monitoring carotid stenosis   Saw neurology Dr Sherryll BurgerShah 07/2019 - mild cognitive impairment with brain MRI completed 06/2019. ?late onset mixed dementia (vascular and alz). MRI showed chronic R MCA infarcts involving R frontal and temporal lobes as well as R basal ganglia - continue statin, xarelto, good BP control. Started on baclofen PRN.   Ran out of levothyroxine 75mcg several months ago.   Notes increased urinary urgency and nocturia, without dysuria, hematuria.  Ongoing knee pain bilaterally s/p synvisc x1 (08/2017). Sees Dr Patsy [place] for this. She continues regular exercise on stationary bicycle daily 30-5960min.   DM - checks sugars fasting daily - 148 fasting yesterday. Compliant  with metformin 500mg  daily, lantus 8u PRN   Preventative: Colon cancer screening -s/p colonoscopy 2013 [place] Lung cancer screening -non smoker Breast cancer screening - mammo remotely WNL. Declines rpt. Does check breast exams at home without concerns. No fmhx breast cancer.  Well woman exam -s/p hysterectomywith BSO.Underwent premarin treatment after menopause. DEXA scan -2013 normal per patient Flu shot -declines due to bad reactionin the past  COVID vaccine J&J 06/2019, booster 10/2019 Prevnar11/2017. Pneumovax - 08/2017. Shingles shot -declines Advanced directive discussion -does not have living will. Discussed. Packet provided today(spanish).Does not want dialysis. Ok with temporary life support, does not want prolonged life support. Seat belt use discussed Sunscreen use discussed. No changing moles on skin.  Non-smoker  Alcohol -rare  Dentist - has dentures Eye exam - DUE Bowels - constipation - managed with water/fiber and soluble fiber supplement.  Bladder - no incontinence   Lives with daughter Jonell CluckWidow  Originally from [place], moved 2013 G5P5 Activity: stationary bicycle 45 min/day Diet: good water, fruits/vegetables daily     Relevant past medical, surgical, family and social history reviewed and updated as indicated. Interim medical history since our last visit reviewed. Allergies and medications reviewed and updated. Outpatient Medications Prior to Visit  Medication Sig Dispense Refill  . Bioflavonoid  Products (BIOFLEX) TABS Take 1 tablet by mouth daily.    . insulin glargine (LANTUS SOLOSTAR) 100 UNIT/ML Solostar Pen Inject 8 Units into the skin daily as needed. 15 mL 3  . Insulin Pen Needle (BD PEN NEEDLE NANO 2ND GEN) 32G X 4 MM MISC USE TO ADMINISTER INSULIN AS DIRECTED 100 each 1  . atenolol (TENORMIN) 25 MG tablet Take 1 tablet (25 mg total) by mouth daily. 90 tablet 3  . levothyroxine (SYNTHROID) 75 MCG tablet Take 1  tablet (75 mcg total) by mouth daily before breakfast. 90 tablet 3  . metFORMIN (GLUCOPHAGE) 500 MG tablet TAKE 1 TABLET BY MOUTH EVERY DAY WITH BREAKFAST 90 tablet 0  . rivaroxaban (XARELTO) 20 MG TABS tablet Take 1 tablet (20 mg total) by mouth daily with supper. 90 tablet 3  . Sennosides 15 MG TABS Take 1 tablet by mouth every other day.    . traZODone (DESYREL) 50 MG tablet TAKE 0.5-1 TABLETS BY MOUTH AT BEDTIME AS NEEDED FOR SLEEP. 90 tablet 0   No facility-administered medications prior to visit.     Per HPI unless specifically indicated in ROS section below Review of Systems  Constitutional: Negative for activity change, appetite change, chills, fatigue, fever and unexpected weight change.  HENT: Negative for hearing loss.   Eyes: Negative for visual disturbance.  Respiratory: Negative for cough, chest tightness, shortness of breath and wheezing.   Cardiovascular: Negative for chest pain, palpitations and leg swelling.  Gastrointestinal: Positive for constipation. Negative for abdominal distention, abdominal pain, blood in stool, diarrhea, nausea and vomiting.  Genitourinary: Negative for difficulty urinating and hematuria.  Musculoskeletal: Negative for arthralgias, myalgias and neck pain.  Skin: Negative for rash.  Neurological: Positive for headaches. Negative for dizziness, seizures and syncope.  Hematological: Negative for adenopathy. Does not bruise/bleed easily.  Psychiatric/Behavioral: Negative for dysphoric mood. The patient is nervous/anxious (at night).    Objective:  BP 124/68   Pulse 82   Temp 98.5 F (36.9 C) (Temporal)   Ht 5\' 2"  (1.575 m)   Wt 137 lb 3 oz (62.2 kg)   SpO2 98%   BMI 25.09 kg/m   Wt Readings from Last 3 Encounters:  04/20/20 137 lb 3 oz (62.2 kg)  05/05/19 109 lb (49.4 kg)  03/21/19 125 lb 4 oz (56.8 kg)      Physical Exam Vitals and nursing note reviewed.  Constitutional:      General: She is not in acute distress.    Appearance:  Normal appearance. She is well-developed and well-nourished. She is not ill-appearing.     Comments: Ambulates with walker   HENT:     Head: Normocephalic and atraumatic.     Right Ear: Hearing, tympanic membrane, ear canal and external ear normal.     Left Ear: Hearing, tympanic membrane, ear canal and external ear normal.     Mouth/Throat:     Mouth: Oropharynx is clear and moist and mucous membranes are normal.     Pharynx: Uvula midline. No posterior oropharyngeal edema.  Eyes:     General: No scleral icterus.    Extraocular Movements: Extraocular movements intact and EOM normal.     Conjunctiva/sclera: Conjunctivae normal.     Pupils: Pupils are equal, round, and reactive to light.  Cardiovascular:     Rate and Rhythm: Normal rate. Rhythm irregular.     Pulses: Normal pulses and intact distal pulses.          Radial pulses are 2+ on the  right side and 2+ on the left side.     Heart sounds: Normal heart sounds. No murmur heard.   Pulmonary:     Effort: Pulmonary effort is normal. No respiratory distress.     Breath sounds: Normal breath sounds. No wheezing, rhonchi or rales.  Abdominal:     General: Abdomen is flat. Bowel sounds are normal. There is no distension.     Palpations: Abdomen is soft. There is no mass.     Tenderness: There is no abdominal tenderness. There is no guarding or rebound.     Hernia: No hernia is present.  Musculoskeletal:        General: No edema. Normal range of motion.     Cervical back: Normal range of motion and neck supple.     Right lower leg: No edema.     Left lower leg: No edema.  Lymphadenopathy:     Cervical: No cervical adenopathy.  Skin:    General: Skin is warm and dry.     Findings: No rash.  Neurological:     General: No focal deficit present.     Mental Status: She is alert and oriented to person, place, and time.     Comments: CN grossly intact, station and gait intact  Psychiatric:        Mood and Affect: Mood and affect and  mood normal.        Behavior: Behavior normal.        Thought Content: Thought content normal.        Judgment: Judgment normal.       Results for orders placed or performed in visit on 04/20/20  POCT Urinalysis Dipstick (Automated)  Result Value Ref Range   Color, UA yellow    Clarity, UA cloudy    Glucose, UA Negative Negative   Bilirubin, UA neg    Ketones, UA eng    Spec Grav, UA >=1.030 (A) 1.010 - 1.025   Blood, UA 1+    pH, UA 6.0 5.0 - 8.0   Protein, UA Negative Negative   Urobilinogen, UA 0.2 0.2 or 1.0 E.U./dL   Nitrite, UA neg    Leukocytes, UA Small (1+) (A) Negative   Assessment & Plan:  This visit occurred during the SARS-CoV-2 public health emergency.  Safety protocols were in place, including screening questions prior to the visit, additional usage of staff PPE, and extensive cleaning of exam room while observing appropriate contact time as indicated for disinfecting solutions.   Problem List Items Addressed This Visit    Weight loss    Weight gain noted.       Urinary urgency    Check UA/micro and UCx.      Relevant Orders   Urine Culture   POCT Urinalysis Dipstick (Automated) (Completed)   Type 2 diabetes mellitus with complication, with long-term current use of insulin (HCC)    Chronic, great control on current regimen of metformin 500mg  daily with PRN lantus 8u Discussed statin indication in diabetic and h/o CVA - they decline at this time.       Relevant Medications   metFORMIN (GLUCOPHAGE) 500 MG tablet   Recurrent falls    She is undergoing PT fall prevention.  High risk given xarelto use.  Discussed walker use.       Primary osteoarthritis of both knees    This has previously significantly helped.  Our office is looking into synvisc injection cost with medicaid.       MCI (  mild cognitive impairment)    Overall intact cognition on exam today.       Iron deficiency    Ongoing. Reviewed renewed efforts towards iron rich diet - will  mail handout. Discussed possible iron infusion - pt prefers to try dietary changes first. rec RTC 6 mo recheck       Insomnia    Stable period off trazodone - will remove from med list .      Hypothyroidism    Off levothyroxine for several months - ran out.  Have refilled today.       Relevant Medications   levothyroxine (SYNTHROID) 75 MCG tablet   atenolol (TENORMIN) 25 MG tablet   History of ischemic stroke without residual deficits    Continue xarelto.       Encounter for general adult medical examination with abnormal findings - Primary    Preventative protocols reviewed and updated unless pt declined. Discussed healthy diet and lifestyle.       Atrial fibrillation (HCC)    Continues xarelto - sees cardiology.       Relevant Medications   rivaroxaban (XARELTO) 20 MG TABS tablet   atenolol (TENORMIN) 25 MG tablet   Anemia    Anticipate iron deficiency anemia. See above.  b12 and folate normal.  No significant h/o CKD           Meds ordered this encounter  Medications  . DISCONTD: polyethylene glycol powder (GLYCOLAX/MIRALAX) 17 GM/SCOOP powder    Sig: Take 17 g by mouth daily as needed for moderate constipation.  . rivaroxaban (XARELTO) 20 MG TABS tablet    Sig: Take 1 tablet (20 mg total) by mouth daily with supper.    Dispense:  90 tablet    Refill:  3  . metFORMIN (GLUCOPHAGE) 500 MG tablet    Sig: Take 1 tablet (500 mg total) by mouth daily with breakfast.    Dispense:  90 tablet    Refill:  3  . levothyroxine (SYNTHROID) 75 MCG tablet    Sig: Take 1 tablet (75 mcg total) by mouth daily before breakfast.    Dispense:  90 tablet    Refill:  3  . atenolol (TENORMIN) 25 MG tablet    Sig: Take 1 tablet (25 mg total) by mouth daily.    Dispense:  90 tablet    Refill:  3  . polyethylene glycol powder (GLYCOLAX/MIRALAX) 17 GM/SCOOP powder    Sig: Take 17 g by mouth daily as needed for moderate constipation.    Dispense:  1700 g    Refill:  1  . b  complex vitamins capsule    Sig: Take 1 capsule by mouth once a week.   Orders Placed This Encounter  Procedures  . Urine Culture  . POCT Urinalysis Dipstick (Automated)    Patient instructions: Urinalysis today.  Considere vacuna contra culebrilla (shingrix). Revise en farmacia local sobre precio.  Haga cita para doctor de hojos, pida que me mande reporte.  Cuidado con caidas! Usar andador regularmente.  Regresar en 6 meses para proxima vista, revisaremos de nuevo niveles de hierro - renueve esfuerzos para Scientist, product/process development.   Follow up plan: Return in about 6 months (around 10/18/2020) for follow up visit.  Eustaquio Boyden, MD

## 2020-04-20 NOTE — Assessment & Plan Note (Signed)
Anticipate iron deficiency anemia. See above.  b12 and folate normal.  No significant h/o CKD

## 2020-04-20 NOTE — Assessment & Plan Note (Signed)
Off levothyroxine for several months - ran out.  Have refilled today.

## 2020-04-20 NOTE — Assessment & Plan Note (Addendum)
Ongoing. Reviewed renewed efforts towards iron rich diet - will mail handout. Discussed possible iron infusion - pt prefers to try dietary changes first. rec RTC 6 mo recheck

## 2020-04-20 NOTE — Assessment & Plan Note (Signed)
Overall intact cognition on exam today.

## 2020-04-20 NOTE — Patient Instructions (Addendum)
Urinalysis today.  Considere vacuna contra culebrilla (shingrix). Revise en farmacia local sobre precio.  Haga cita para doctor de hojos, pida que me mande reporte.  Cuidado con caidas! Usar andador regularmente.  Regresar en 6 meses para proxima vista, revisaremos de nuevo niveles de hierro - renueve esfuerzos para Scientist, product/process development.   Mantenimiento de la salud despus de los 65 aos de edad Health Maintenance After Age 84 Despus de los 65 aos de edad, corre un riesgo mayor de Film/video editor enfermedades e infecciones a Air cabin crew, como tambin de sufrir lesiones por cadas. Las cadas son la causa principal de las fracturas de huesos y lesiones en la cabeza de personas mayores de 65 aos de edad. Recibir cuidados preventivos de forma regular puede ayudarlo a mantenerse saludable y en buen [place]. Los cuidados preventivos incluyen realizarse anlisis de forma regular y Forensic psychologist en el estilo de vida segn las recomendaciones del mdico. Converse con el profesional que lo asiste sobre:  Las pruebas de deteccin y los anlisis que debe International aid/development worker. Una prueba de deteccin es un estudio que se para Engineer, manufacturing la presencia de una enfermedad cuando no tiene sntomas.  Un plan de dieta y ejercicios adecuado para usted. Qu debo saber sobre las pruebas de deteccin y los anlisis para prevenir cadas? Realizarse pruebas de deteccin y [employer] es la mejor manera de Engineer, manufacturing un problema de salud de forma temprana. El diagnstico y tratamiento tempranos le brindan la mejor oportunidad de Chief Operating Officer las afecciones mdicas que son comunes despus de los 65 aos de edad. Ciertas afecciones y elecciones de estilo de vida pueden hacer que sea ms propenso a sufrir Engineer, site. El mdico puede recomendarle lo siguiente:  Controles regulares de la visin. Una visin deficiente y afecciones como las cataratas pueden hacer que sea ms propenso a sufrir Engineer, site. Si [place] lentes, asegrese de obtener una  receta actualizada si su visin cambia.  Revisin de medicamentos. Revise regularmente con el mdico todos los medicamentos que toma, incluidos los medicamentos de [place]. Consulte al [employer] Products efectos secundarios que pueden hacer que sea ms propenso a sufrir Engineer, site. Informe al mdico si alguno de los medicamentos que toma lo hace sentir mareado o somnoliento.  Pruebas de deteccin para la osteoporosis. La osteoporosis es una afeccin que hace que los huesos se vuelvan ms frgiles. En consecuencia, los huesos pueden debilitarse y quebrarse ms fcilmente.  Pruebas de deteccin para la presin arterial. Los cambios en la presin arterial y los medicamentos para Chief Operating Officer la presin arterial pueden hacerlo sentir mareado.  Controles de fuerza y equilibrio. El mdico puede recomendar ciertos estudios para controlar su fuerza y equilibrio al estar de pie, al caminar o al cambiar de posicin.  Examen de los pies. El dolor y Materials engineer en los pies, como tambin no utilizar el calzado [place], pueden hacer que sea ms propenso a sufrir Engineer, site.  Prueba de deteccin de la depresin. Es ms probable que sufra una cada si tiene miedo a caerse, se siente mal emocionalmente o se siente incapaz de Probation officer.  Prueba de deteccin de consumo de alcohol. Beber demasiado alcohol puede afectar su equilibrio y puede hacer que sea ms propenso a sufrir Engineer, site. Qu medidas puedo tomar para reducir mi riesgo de sufrir una cada? Instrucciones generales  Hable con el mdico sobre sus riesgos de sufrir una cada. Infrmele a su mdico si: ? Se cae. Asegrese de informarle a su mdico  acerca de todas las cadas, incluso aquellas que parecen ser [employer]. ? Se siente mareado, somnoliento o que pierde el equilibrio.  Tome los medicamentos de venta libre y los recetados solamente como se lo haya indicado el mdico. Estos incluyen todos los suplementos.  Siga una  dieta sana y [place] un peso saludable. Una dieta saludable incluye productos lcteos descremados, carnes bajas en contenido de grasa ([place], Webber de granos enteros, frijoles y [place] frutas y verduras. La seguridad en el hogar  Retire los objetos que puedan causar tropiezos tales como alfombras, cables u obstculos.  Instale equipos de seguridad, como barras para sostn en los baos y barandas de seguridad en las escaleras.  Mantenga las habitaciones y los pasillos bien iluminados. Actividad  Siga un programa de ejercicio regular para mantenerse en forma. Esto lo ayudar a Radio producer equilibrio. Consulte al mdico qu tipos de ejercicios son adecuados para usted.  Si necesita un bastn o un andador, selo segn las recomendaciones del mdico.  Utilice calzado con buen apoyo y suela antideslizante.   Estilo de vida  No beba alcohol si el mdico le indica que no beba.  Si bebe alcohol, limite la cantidad que consume: ? De 0 a 1 medida por da para las mujeres. ? De 0 a 2 medidas por da para los hombres.  Est atento a la cantidad de alcohol que contiene su bebida. En los [place] una medida equivale a una botella tpica de cerveza (12 onzas), media copa de vino (5 onzas) o una medida de bebida blanca (1 onza).  No consuma ningn producto que contenga nicotina o tabaco, como cigarrillos y Administrator, Civil Service. Si necesita ayuda para dejar de fumar, consulte al mdico. Resumen  Tener un estilo de vida saludable y recibir cuidados preventivos pueden ayudar a Research scientist (physical sciences) salud y el bienestar despus de los 65 aos de [place].  Realizarse pruebas de deteccin y [employer] es la mejor manera de Engineer, manufacturing un problema de salud de forma temprana y [place] a Automotive engineer una cada. El diagnstico y tratamiento tempranos le brindan la mejor oportunidad de Chief Operating Officer las afecciones mdicas ms comunes en las personas mayores de 65 aos de edad.  Las cadas son la causa principal de las fracturas de  huesos y lesiones en la cabeza de personas mayores de 65 aos de edad. Tome precauciones para evitar una cada en su casa.  Trabaje con el mdico para saber qu cambios que puede hacer para mejorar su salud y [place], y para prevenir las cadas. Esta informacin no tiene Theme park manager el consejo del mdico. Asegrese de hacerle al mdico cualquier pregunta que tenga. Document Revised: 04/02/2017 Document Reviewed: 04/02/2017 Elsevier Patient Education  2021 [employer].

## 2020-04-20 NOTE — Assessment & Plan Note (Addendum)
This has previously significantly helped.  Our office is looking into synvisc injection cost with medicaid.

## 2020-04-20 NOTE — Assessment & Plan Note (Addendum)
She is undergoing PT fall prevention.  High risk given xarelto use.  Discussed walker use.

## 2020-04-20 NOTE — Assessment & Plan Note (Signed)
Continues xarelto - sees cardiology.

## 2020-04-20 NOTE — Assessment & Plan Note (Signed)
Weight gain noted.  

## 2020-04-20 NOTE — Assessment & Plan Note (Signed)
Preventative protocols reviewed and updated unless pt declined. Discussed healthy diet and lifestyle.  

## 2020-04-20 NOTE — Assessment & Plan Note (Signed)
Stable period off trazodone - will remove from med list .

## 2020-04-20 NOTE — Assessment & Plan Note (Addendum)
Chronic, great control on current regimen of metformin 500mg  daily with PRN lantus 8u Discussed statin indication in diabetic and h/o CVA - they decline at this time.

## 2020-04-21 LAB — URINE CULTURE
MICRO NUMBER:: 11552571
SPECIMEN QUALITY:: ADEQUATE

## 2020-04-24 LAB — HM DIABETES EYE EXAM

## 2020-04-25 ENCOUNTER — Ambulatory Visit: Payer: 59 | Attending: Neurology

## 2020-04-25 ENCOUNTER — Other Ambulatory Visit: Payer: Self-pay

## 2020-04-25 DIAGNOSIS — R262 Difficulty in walking, not elsewhere classified: Secondary | ICD-10-CM | POA: Diagnosis present

## 2020-04-25 DIAGNOSIS — M6281 Muscle weakness (generalized): Secondary | ICD-10-CM | POA: Diagnosis present

## 2020-04-25 DIAGNOSIS — R2681 Unsteadiness on feet: Secondary | ICD-10-CM | POA: Diagnosis present

## 2020-04-25 NOTE — Telephone Encounter (Signed)
Now that I am back in the office, I will take over the investigation and talk with [name] with our sports med department to review any coverage options.   Thanks.

## 2020-04-25 NOTE — Therapy (Signed)
[place] MAIN [facility] SERVICES [address] [place], [place], [address] Phone: [phone]   Fax:  [phone]  Physical Therapy Evaluation  Patient Details  Name: [name] MRN: [id number] Date of Birth: 1934-04-14 Referring Provider (PT): Jennings Books   Encounter Date: 04/25/2020   PT End of Session - 04/25/20 2018    Visit Number 1    Number of Visits 1    Date for PT Re-Evaluation 04/25/20    PT Start Time 1245    PT Stop Time 1540    PT Time Calculation (min) 65 min    Equipment Utilized During Treatment Gait belt    Activity Tolerance Patient limited by fatigue;Patient limited by pain    Behavior During Therapy [facility] for tasks assessed/performed           Past Medical History:  Diagnosis Date  . Allergy   . Atrial fibrillation ([place]) 2014   presumed as on xarelto after CVA 2014  . Bilateral primary osteoarthritis of knee    severe by xrays  . Carotid stenosis 10/25/2015   Mild by [place] 2014  . Diabetes mellitus without complication ([place]) 8099  . Heart disease   . Hepatitis    age 21  . HLD (hyperlipidemia)   . Hypertension   . Hypothyroidism   . Ischemic stroke ([place]) 2014   R basal ganglion infarct with L hemiparesis 2014, now largely resolved, uses cane  . Rheumatic fever   . Wears dentures    upper and lower    Past Surgical History:  Procedure Laterality Date  . CATARACT EXTRACTION W/PHACO Right 12/25/2015   Procedure: CATARACT EXTRACTION PHACO AND INTRAOCULAR LENS PLACEMENT (IOC);  Surgeon: Eulogio Bear, MD;  Location: [place];  Service: Ophthalmology;  Laterality: Right;  DIABETIC NEEDS INTERPRETER RIGHT  . COLONOSCOPY  2013   per patient WNL ([place])  . TOTAL ABDOMINAL HYSTERECTOMY W/ BILATERAL SALPINGOOPHORECTOMY  1972    There were no vitals filed for this visit.    Subjective Assessment - 04/25/20 2014    Subjective Patient presents to physical therapy with daughter for wheelchair  evaluation    Patient is accompained by: Family member    Pertinent History Patient is a pleasant 86 year old female who presents for power wheelchair due to instability and frequent falls. Patient has seen physical therapy in the past but was limited by bilateral knee pain. Had a CVA ~ 5 years ago with worsening balance. She has had multiple falls with some involving hitting her head. She is on blood thinners and there is concern for repeated falls.  PMH includes A fib, bilateral OA of knees, carotid stenosis, DM, hepatitis, HLD, HTN, hypothyroidism, stroke, rheumatic fever, osteopenia, MCI. She can't turn around and sit in a chair when walking, fell earlier this week. Needs help to transfer from standing to sitting into chair. Is able to walk a few feet only per daughter and is progressively worsening.    Limitations Lifting;Standing;Walking;House hold activities    How long can you sit comfortably? n/a    How long can you stand comfortably? unsteady upon standing    How long can you walk comfortably? needs assistance    Currently in Pain? Yes    Pain Score 8     Pain Location Knee    Pain Orientation Right;Left    Pain Descriptors / Indicators Aching    Pain Type Chronic pain    Pain Onset More than a month ago  Pain Frequency Constant    Aggravating Factors  weightbearing, standing    Pain Relieving Factors cream, sitting    Effect of Pain on Daily Activities limits mobility             Diagnosis: frequent falls, imbalance    [facility] PT Assessment - 04/25/20 0001      Assessment   Medical Diagnosis imbalance    Referring Provider (PT) Manuella Ghazi, [place]    Onset Date/Surgical Date --   ~ 5 years ago   Hand Dominance Right      Precautions   Precautions Fall      Restrictions   Weight Bearing Restrictions No      Balance Screen   Has the patient fallen in the past 6 months Yes    How many times? multiple    Has the patient had a decrease in activity level because of a fear of  falling?  Yes    Is the patient reluctant to leave their home because of a fear of falling?  Yes      [place] Private residence    Living Arrangements Children    Available Help at Discharge Family    Type of [place] to enter    Entrance Stairs-Number of Steps 3    [place] Two level;Able to live on main level with bedroom/bathroom    [place] - 4 wheels;Shower seat;Toilet riser;Grab bars - toilet;Grab bars - tub/shower;Hand held shower head             PATIENT INFORMATION: This Evaluation form will serve as the LMN for the following suppliers:  Supplier: [employer] Person: [name]  Phone: [phone]   Reason for Referral: Patient/caregiver Goals: Patient was seen for face-to-face evaluation for new power wheelchair.  Also present was    [name] from [employer]   to discuss recommendations and wheelchair options.  Further paperwork was completed and sent to vendor.  Patient appears to qualify for power mobility device at this time per objective findings.   MEDICAL HISTORY: Diagnosis:imbalance, falls  Primary Diagnosis Onset: Progressively declining since CVA ~ 5 years ago  '[x]' Progressive Disease Relevant Past and Future Surgeries: non per patient besides colonoscopy Height: 33f 2 Weight: 137 Explain and recent changes or trends in weight: Per daughter has gained a few pounds due to not being able to move as much.    Relevant History including falls:  Patient is a pleasant 86year old female who presents for power wheelchair due to instability and frequent falls. Patient has seen physical therapy in the past but was limited by bilateral knee pain. Had a CVA ~ 5 years ago with worsening balance. She has had multiple falls with some involving hitting her head. She is on blood thinners and there is concern for repeated falls.  PMH includes A fib, bilateral OA of knees, carotid stenosis,  DM, hepatitis, HLD, HTN, hypothyroidism, stroke, rheumatic fever, osteopenia, MCI. She can't turn around and sit in a chair when walking, fell earlier this week. Needs help to transfer from standing to sitting into chair. Is able to walk a few feet only per daughter and is progressively worsening.     HOME ENVIRONMENT: '[x]' House  '[]' Condo/town home  '[]' Apartment  '[]' Assisted Living    '[]' Lives Alone '[x]'  Lives with Others  Hours with caregiver: stays with daughter, someone from family is with her all the time.   '[x]' Home is accessible to patient            Stairs  '[]' Yes '[x]'  No     Ramp '[]' Yes '[]' No Comments:  Is a two story home but lives in an adapted bedroom downstairs.  Three steps to get into house.  Bathroom is adapted completely for patient.    COMMUNITY ADL: TRANSPORTATION: '[x]' Car    '[]' Van    '[]' Public Transportation    '[]' Adapted w/c Lift   '[]' Ambulance   '[]' Other:       '[]' Sits in wheelchair during transport  Employment/School:     Specific requirements pertaining to mobility                                                     Other:       Patient travels with daughter who has to lift her legs into/out of car.                                 FUNCTIONAL/SENSORY PROCESSING SKILLS:  Handedness:   '[x]' Right     '[]' Left    '[]' NA  Comments:                                 Functional Processing Skills for Wheeled Mobility '[x]' Processing Skills are adequate for safe wheelchair operation  Areas of concern than may interfere with safe operation of wheelchair Description of problem   '[]'  Attention to environment     '[]' Judgment     '[]'  Hearing  '[]'  Vision or visual processing    '[]' Motor Planning  '[]'  Fluctuations in Behavior                                                   VERBAL COMMUNICATION: '[x]' WFL receptive '[x]'  WFL expressive '[]' Understandable  '[]' Difficult to understand  '[]' non-communicative '[]'  Uses an augmented communication device     CURRENT SEATING / MOBILITY: Current Mobility Base:   '[x]' None  '[]' Dependent  '[]' Manual  '[]' Scooter  '[]' Power   Type of Control:                       Manufacturer:                         Size:                         Age:                           Current Condition of Mobility Base:  Current Wheelchair components:                                                                                                                                   Describe posture in present seating system:                                                                            SENSATION and SKIN ISSUES: Sensation '[x]' Intact '[]' Impaired '[]' Absent   Level of sensation:                           Pressure Relief: Able to perform effective pressure relief :   '[x]' Yes  '[]'  No Method:  tricep press off, scoot forward, lateral.                                                                             If not, Why?:                                                                          Skin Issues/Skin Integrity Current Skin Issues   '[]' Yes '[x]' No  '[]' Intact '[]'  Red area '[]'  Open Area  '[]' Scar Tissue '[]' At risk from prolonged sitting  Where                              History of Skin Issues   '[]' Yes '[x]' No  Where                                         When                                               Hx of skin flap surgeries '[]' Yes '[]' No  Where  When                                                  Limited sitting tolerance '[]' Yes '[x]' No Hours spent sitting in wheelchair daily:                                                         Complaint of Pain:  Please describe:  Knees: 9-10/10, applies cream every 3-4 minutes.  Back pain: 6/10                                                                                                           Swelling/Edema:     History of  swelling but none today.                                                                                                                                              ADL STATUS (in reference to wheelchair use):  Indep Assist Unable Indep with Equip Not assessed Comments  Dressing                 x                       x                   Needs to be seated. Needs help with a coat              Eating     x                                                     Ind with eating  Toileting                  x                        x                    House set up is mod I with equipment but when out in non adapted bathroom needs assistance                                                                Bathing              x                                                 Showers on chair.  Needs someone with her.                                                                       Grooming/ Hygiene                                        x                                                                           Able to perform with set up           Meal Prep               x                                              Daughter must perform                                                             IADLS                 x  Daughter must assist                        Bowel Management: '[]' Continent  '[]' Incontinent  '[x]' Accidents Comments:                                                  Bladder Management: '[]' Continent  '[]' Incontinent  '[x]' Accidents Comments:       Does not make it to the restroom in time                                        [facility] SKILLS: Manual w/c Propulsion: '[]' UE or LE strength and endurance sufficient to participate in ADLs using manual wheelchair Arm :  '[]' left '[]' right  '[]' Both                                   Foot:   '[]' left '[]' right   '[]' Both  Distance:   Operate Scooter: '[]'  Strength, hand grip, balance and transfer appropriate for use '[]' Living environment is accessible for use of scooter  Operate Power w/c:  '[x]'  Std. Joystick   '[]'  Alternative Controls Indep '[x]'  Assist '[]'  Dependent/ Unable '[]'  N/A '[]'  '[]' Safe          '[]'  Functional      Distance:                Bed confined without wheelchair '[x]'  Yes due to high fall risk and safety limitations  '[]'  No   STRENGTH/RANGE OF MOTION:  Range of Motion Strength  Shoulder    ~20% lack of end range in all directions                                               Grossly 3/5                                                           Elbow            WFL but painful                                      Grossly 3/5                                                              Wrist/Hand           Atrophy limitations but functional range  Grossly 2+/5                                                         Hip       Bilateral hip abduction and extension limitations                                                          Painful; grossly 3/5 with extension 2+/5                                                            Knee     Limited full extension and full flexion by bilateral knee pain with R more limited than left                                                          Grossly 2/5                                                               Ankle  limited dorsiflexion bilaterally due to weakness           Grossly 3/5                                                             MOBILITY/BALANCE:  '[]'  Patient is totally dependent for mobility                                                                                               Balance Transfers Ambulation  Sitting Balance: Standing Balance: '[]'  Independent '[]'  Independent/Modified Independent  '[]'  WFL     '[]'  WFL '[x]'  Supervision; for squat pivot transfer '[]'  Supervision  '[x]'   Uses UE for balance  '[x]'  Supervision; needs close CGA when nonpainful, needs heavy UE support  '[x]'  Min Assist; Standing transfers needs min A for safety due to knee pain and buckling  '[]'  Ambulates with Assist                           '[]'   Min Assist '[x]'  Min assist; needs assistance when fatigued '[]'  Mod Assist '[x]'  Ambulates with Device:  '[]'  RW   '[]'  StW   '[]'  Cane   '[x]'   Rollator with min A; very unsafe; high fall risk               '[]'  Mod Assist '[]'  Mod assist '[]'  Max assist   '[]'  Max Assist '[]'  Max assist '[]'  Dependent '[]'  Indep. Short Distance Only  '[]'  Unable '[]'  Unable '[]'  Lift / Sling Required Distance (in feet)                             '[]'  Sliding board '[]'  Unable to Ambulate: (Explain:  Cardio Status:  '[]' Intact  '[x]'  Impaired   '[]'  NA                              Respiratory Status:  '[x]' Intact   '[]' Impaired   '[]' NA                                     Orthotics/Prosthetics:  N/a                                                                        Comments (Address manual vs power w/c vs scooter):     Patient is a pleasant 86 year old female who presents with diffuse weakness, limited mobility, and high fall risk. Patient is a high fall risk due to bilateral knee pain that occasionally leads to buckling of limbs as well as generalized weakness s/p CVA. Patient ambulates short distances with min A but is very unsafe, her 10 MWT requires min A to remain upright and not fall and was performed in 0.31 m/s with a walker Patient does not have enough UE strength to safely maneuver a manual wheelchair nor the dexterity to push it. Her core stability is limited in combination with her back pain making a scooter unsafe. A power wheelchair is her best option to decrease her fall risk, improve her mobility, and improve her quality of life.                                                           Anterior / Posterior Obliquity Rotation-Pelvis  PELVIS    '[x]' Neutral  '[]'  Posterior  '[]'  Anterior      '[x]' WFL  '[]' Right Elevated  '[]' Left Elevated   '[x]' WFL  '[]' Right Anterior '[]'   Left Anterior    '[]'  Fixed '[x]'  Partly Flexible '[]'  Flexible  '[]'  Other  '[]'  Fixed  '[x]'  Partly Flexible  '[]'  Flexible '[]'  Other  '[]'  Fixed  '[x]'  Partly Flexible  '[]'  Flexible '[]'  Other  TRUNK '[]' WFL '[x]' Thoracic Kyphosis '[]' Lumbar Lordosis   '[x]'  WFL '[]' Convex Right '[]' Convex Left   '[]' c-curve '[]' s-curve '[]' multiple  '[x]'  Neutral '[]'  Left-anterior '[]'  Right-anterior    '[]'  Fixed '[]'  Flexible '[x]'   Partly Flexible       Other  '[]'  Fixed '[]'  Flexible '[x]'  Partly Flexible '[]'  Other  '[]'  Fixed           '[]'  Flexible '[x]'  Partly Flexible '[]'  Other   Position Windswept   HIPS  '[x]'  Neutral '[]'  Abduct '[]'  ADduct '[x]'  Neutral '[]'  Right '[]'  Left       '[]'  Fixed  '[x]'  Partly Flexible             '[]'  Dislocated '[]'  Flexible '[]'  Subluxed    '[]'  Fixed '[x]'  Partly Flexible  '[]'  Flexible '[]'  Other              Foot Positioning Knee Positioning   Knees and  Feet  '[x]'  WFL '[]' Left '[]' Right '[x]'  WFL '[]' Left '[]' Right   KNEES ROM concerns: ROM concerns:   & Dorsi-Flexed                    '[]' Lt '[]' Rt                                  FEET Plantar Flexed                  '[]' Lt '[]' Rt     Inversion                    '[]' Lt '[]' Rt     Eversion                    '[]' Lt '[]' Rt    HEAD '[x]'  Functional '[x]'  Good Head Control   & '[]'  Flexed         '[]'  Extended '[]'  Adequate Head Control   NECK '[]'  Rotated  Lt  '[]'  Lat Flexed Lt '[]'  Rotated  Rt '[]'  Lat Flexed Rt '[]'  Limited Head Control    '[]'  Cervical Hyperextension '[]'  Absent  Head Control    SHOULDERS ELBOWS WRIST& HAND         Left     Right    Left     Right  U/E '[]' Functional  Left            '[]' Functional  Right                                 '[]' Fisting             '[]' Fisting     '[]' elevated Left '[]' depressed  Left '[]' elevated Right '[]' depressed  Right      '[x]' protracted Left '[]' retracted Left '[x]' protracted Right '[]' retracted Right '[]' subluxed  Left              '[]' subluxed  Right         Goals for  Wheelchair Mobility  '[x]'  Independence with mobility in the home with motor related ADLs (MRADLs)  '[x]'  Independence with MRADLs in the community '[]'  Provide dependent mobility  '[]'  Provide recline     '[]' Provide tilt   Goals for Seating system '[x]'  Optimize pressure distribution '[x]'  Provide support needed to facilitate function or safety '[x]'  Provide corrective forces to assist with maintaining or improving posture '[]'  Accommodate client's posture: current seated postures and positions are not flexible or will not tolerate corrective forces '[x]'  Client to be independent with relieving pressure in the wheelchair '[]' Enhance physiological function such as breathing, swallowing, digestion  Simulation ideas/Equipment trials:  State why other equipment was unsuccessful:                                                                       Patient will benefit from the [place] wheelchair due to her high fall risk, limited mobility, high pain levels, and weakened state. Patient is not a candidate for a manual wheelchair as her UE's are not strong enough to propel herself as well as her limited dexterity of her hands. Her trunk is not stable enough to safely allow her to utilize a scooter in combination with her back pain.  She is a high fall risk and is not safe ambulating with an AD. Her frequency of falling combined with her medications makes mobility without a power chair unsafe. Her safety awareness is present, her high fall risk is due to generalized lack of strength, pain, and buckling of knees. A power wheelchair will allow for her to perform functions of daily life, reduce the number of urinary accidents, reduce her fall risk and improve her quality of life. A [employer] Junior wheelchair is the best option for her at this time.  The standard seating and back rest will be optimal for the patient.  Adjustable  height full length arm rests will allow for patient to stabilize herself, weight shift, and perform ADLs in chair.  A center mount flip up footplate will allow patient to transfer and safely position LE's into proper alignment for reduced pain and improved safety. Flat free insert tires will reduce maintenance needs. In conclusion the [employer] Power wheelchair is the optimal choice for this patient as it will decrease her risk for falls, improve her functional mobility and independence with ADLs, and improve her quality of life.         MOBILITY BASE RECOMMENDATIONS and JUSTIFICATION: MOBILITY COMPONENT JUSTIFICATION  Manufacturer: Designer, multimedia: Junior             Size: Width 18          Seat Depth  16           '[x]' provide transport from point A to B '[x]' promote Indep mobility  '[x]' is not a safe, functional ambulator '[x]' walker or cane inadequate '[]' non-standard width/depth necessary to accommodate anatomical measurement '[]'                             '[]' Manual Mobility Base '[]' non-functional ambulator    '[]' Scooter/POV  '[]' can safely operate  '[]' can safely transfer   '[]' has adequate trunk stability  '[]' cannot functionally propel manual w/c  '[x]' Power Mobility Base  '[x]' non-ambulatory  '[x]' cannot functionally propel manual wheelchair  '[x]'  cannot functionally and safely operate scooter/POV '[x]' can safely operate and willing to  '[]' Stroller Base '[]' infant/child  '[]' unable to propel manual wheelchair '[]' allows for growth '[]' non-functional ambulator '[]' non-functional UE '[]' Indep mobility is not a goal at this time  '[]' Tilt  '[]' Forward                   '[]' Backward                  '[]' Powered tilt              '[]'   Manual tilt  '[]' change position against gravitational force on head and shoulders  '[]' change position for pressure relief/cannot weight shift '[]' transfers  '[]' management of tone '[]' rest periods '[]' control edema '[]' facilitate postural control  '[]'                                       '[]' Recline   '[]' Power recline on power base '[]' Manual recline on manual base  '[]' accommodate femur to back angle  '[]' bring to full recline for ADL care  '[]' change position for pressure relief/cannot weight shift '[]' rest periods '[]' repositioning for transfers or clothing/diaper /catheter changes '[]' head positioning  '[]' Lighter weight required '[]' self- propulsion  '[]' lifting '[]'                                                 '[]' Heavy Duty required '[]' user weight greater than 250# '[]' extreme tone/ over active movement '[]' broken frame on previous chair '[]'                                     '[]'  Back  '[]'  Angle Adjustable '[]'  Custom molded                           '[]' postural control '[]' control of tone/spasticity '[]' accommodation of range of motion '[]' UE functional control '[]' accommodation for seating system '[]'                                          '[]' provide lateral trunk support '[]' accommodate deformity '[]' provide posterior trunk support '[]' provide lumbar/sacral support '[]' support trunk in midline '[]' Pressure relief over spinal processes  '[]'  Seat Cushion                       '[]' impaired sensation  '[]' decubitus ulcers present '[]' history of pressure ulceration '[]' prevent pelvic extension '[]' low maintenance  '[]' stabilize pelvis  '[]' accommodate obliquity '[]' accommodate multiple deformity '[]' neutralize lower extremity position '[]' increase pressure distribution '[]'                                           '[]'  Pelvic/thigh support  '[]'  Lateral thigh guide '[]'  Distal medial pad  '[]'  Distal lateral pad '[]'  pelvis in neutral '[]' accommodate pelvis '[]'  position upper legs '[]'  alignment '[]'  accommodate ROM '[]'  decrease adduction '[]' accommodate tone '[]' removable for transfers '[]' decrease abduction  '[]'  Lateral trunk Supports '[]'  Lt     '[]'  Rt '[]' decrease lateral trunk leaning '[]' control tone '[]' contour for increased contact '[]' safety  '[]' accommodate asymmetry '[]'                                                 '[]'  Mounting hardware  '[]' lateral trunk  supports  '[]' back   '[]' seat '[]' headrest      '[]'  thigh support '[]' fixed   '[]' swing away '[]' attach seat platform/cushion to w/c frame '[]' attach back cushion to w/c frame '[]' mount postural supports '[]'   mount headrest  '[]' swing medial thigh support away '[]' swing lateral supports away for transfers  '[]'                                                     Armrests  '[]' fixed '[x]' adjustable height '[]' removable   '[]' swing away  '[]' flip back   '[]' reclining '[x]' full length pads '[]' desk    '[]' pads tubular  '[x]' provide support with elbow at 90   '[]' provide support for w/c tray '[x]' change of height/angles for variable activities '[]' remove for transfers '[]' allow to come closer to table top '[]' remove for access to tables '[]'                                               Hangers/ Leg rests  '[]' 60 '[]' 70 '[]' 90 '[]' elevating '[]' heavy duty  '[]' articulating '[]' fixed '[]' lift off '[]' swing away     '[]' power '[]' provide LE support  '[]' accommodate to hamstring tightness '[]' elevate legs during recline   '[]' provide change in position for Legs '[]' Maintain placement of feet on footplate '[]' durability '[]' enable transfers '[]' decrease edema '[]' Accommodate lower leg length '[]'                                         Foot support Footplate    '[]' Lt  '[]'  Rt  '[x]'  Center mount '[x]' flip up                            '[x]' depth/angle adjustable '[]' Amputee adapter    '[]'  Lt     '[]'  Rt '[x]' provide foot support '[]' accommodate to ankle ROM '[x]' transfers '[]' Provide support for residual extremity '[]'  allow foot to go under wheelchair base '[]'  decrease tone  '[]'                                                 '[]'  Ankle strap/heel loops '[]' support foot on foot support '[]' decrease extraneous movement '[]' provide input to heel  '[]' protect foot  Tires: '[]' pneumatic  '[x]' flat free inserts  '[]' solid  '[x]' decrease maintenance  '[]' prevent frequent flats '[]' increase shock absorbency '[]' decrease pain from road shock '[]' decrease spasms from road shock '[]'                                              '[]'   Headrest  '[]' provide posterior head support '[]' provide posterior neck support '[]' provide lateral head support '[]' provide anterior head support '[]' support during tilt and recline '[]' improve feeding   '[]' improve respiration '[]' placement of switches '[]' safety  '[]' accommodate ROM  '[]' accommodate tone '[]' improve visual orientation  '[]'  Anterior chest strap '[]'  Vest '[]'  Shoulder retractors  '[]' decrease forward movement of shoulder '[]' accommodation of TLSO '[]' decrease forward movement of trunk '[]' decrease shoulder elevation '[]' added abdominal support '[]' alignment '[]' assistance with shoulder control  '[]'   Pelvic Positioner '[x]' Belt '[]' SubASIS bar '[]' Dual Pull '[]' stabilize tone '[x]' decrease falling out of chair/ **will not Decrease potential for sliding due to pelvic tilting '[]' prevent excessive rotation '[]' pad for protection over boney prominence '[]' prominence comfort '[]' special pull angle to control rotation '[]'                                                  Upper ExtremitySupport  '[]' L   '[]'  R '[]' Arm trough   '[]' hand support '[]'  tray       '[]' full tray '[]' swivel mount '[]' decrease edema      '[]' decrease subluxation   '[]' control tone   '[]' placement for AAC/Computer/EADL '[]' decrease gravitational pull on shoulders '[]' provide midline positioning '[]' provide support to increase UE function '[]' provide hand support in natural position '[]' provide work surface   POWER WHEELCHAIR CONTROLS  '[x]' Proportional  '[]' Non-Proportional Type     Joystick                                 '[]' Left  '[x]' Right '[]' provides access for controlling wheelchair   '[]' lacks motor control to operate proportional drive control '[]' unable to understand proportional controls  Actuator Control Module  '[]' Single  '[]' Multiple   '[]' Allow the client to operate the power seat function(s) through the joystick control   '[]' Safety Reset Switches '[]' Used to change modes and stop the wheelchair when driving in latch mode     '[]' Upgraded Electronics   '[]' programming for accurate control '[]' progressive Disease/changing condition '[]' non-proportional drive control needed '[]' Needed in order to operate power seat functions through joystick control   '[]' Display box '[]' Allows user to see in which mode and drive the wheelchair is set  '[]' necessary for alternate controls    '[]' Digital interface electronics '[]' Allows w/c to operate when using alternative drive controls  '[]' ASL Head Array '[]' Allows client to operate wheelchair  through switches placed in tri-panel headrest  '[]' Sip and puff with tubing kit '[]' needed to operate sip and puff drive controls  '[]' Upgraded tracking electronics '[]' increase safety when driving '[]' correct tracking when on uneven surfaces  '[x]' Mount for switches or joystick '[]' Attaches switches to w/c  '[x]' Swing away for access or transfers '[]' midline for optimal placement '[]' provides for consistent access  '[]' Attendant controlled joystick plus mount '[]' safety '[]' long distance driving '[]' operation of seat functions '[]' compliance with transportation regulations '[]'                                             Rear wheel placement/Axle adjustability '[]' None '[]' semi adjustable '[]' fully adjustable  '[]' improved UE access to wheels '[]' improved stability '[]' changing angle in space for improvement of postural stability '[]' 1-arm drive access '[]' amputee pad placement '[]'                                Wheel rims/ hand rims  '[]' metal   '[]' plastic coated '[]' oblique projections           '[]' vertical projections '[]' Provide ability to propel manual wheelchair  '[]'  Increase self-propulsion with hand weakness/decreased grasp  Push handles '[]' extended   '[]' angle adjustable              '[]' standard '[]' caregiver access '[]' caregiver assist '[]' allows "hooking" to enable increased ability  to perform ADLs or maintain balance  One armed device   '[]' Lt   '[]' Rt '[]' enable propulsion of manual wheelchair with one arm   '[]'                                             Brake/wheel lock extension '[]'  Lt   '[]'  Rt '[]' increase indep in applying wheel locks   '[]' Side guards '[]' prevent clothing getting caught in wheel or becoming soiled '[]'  prevent skin tears/abrasions  Battery:    12 V 18 amp                                        '[x]' to power wheelchair                                                         Other:                                                                                                                        The above equipment has a life- long use expectancy. Growth and changes in medical and/or functional conditions would be the exceptions. This is to certify that the therapist has no financial relationship with durable medical provider or manufacturer. The therapist will not receive remuneration of any kind for the equipment recommended in this evaluation.   Patient has mobility limitation that significantly impairs safe, timely participation in one or more mobility related ADL's. (bathing, toileting, feeding, dressing, grooming, moving from room to room)  '[x]'  Yes '[]'  No  Will mobility device sufficiently improve ability to participate and/or be aided in participation of MRADL's?      '[x]'  Yes '[]'  No  Can limitation be compensated for with use of a cane or walker?                                    '[]'  Yes '[x]'  No  Does patient or caregiver demonstrate ability/potential ability & willingness to safely use the mobility device?    '[x]'  Yes '[]'  No  Does patient's home environment support use of recommended mobility device?            '[x]'  Yes '[]'  No  Does patient have sufficient upper extremity function necessary to functionally propel a manual wheelchair?     '[]'  Yes '[x]'  No  Does patient have sufficient strength and trunk stability to safely operate a POV (scooter)?                                  '[]'   Yes '[x]'  No  Does patient need additional features/benefits provided by a power wheelchair for MRADL's in the home?        '[x]'  Yes '[]'  No  Does the  patient demonstrate the ability to safely use a power wheelchair?                   '[x]'  Yes '[]'  No     Physician's Name Printed:                                                        Physician's Signature:  Date:     This is to certify that I, the above signed therapist have the following affiliations: '[]'  This DME provider '[]'  Manufacturer of recommended equipment '[]'  Patient's long term care facility '[x]'  None of the above  Therapist Name/Signature:         Janna Arch, PT, DPT                                     Date: 04/25/20             Objective measurements completed on examination: See above findings.               PT Education - 04/25/20 2017    Education Details wheelchair eval    Person(s) Educated Patient;Child(ren)    Methods Explanation;Demonstration;Tactile cues;Verbal cues    Comprehension Verbalized understanding;Returned demonstration;Verbal cues required;Tactile cues required            PT Short Term Goals - 04/25/20 2020      PT SHORT TERM GOAL #1   Title Pt and caregivers will understand PT recommendation and appropriate/safe use for wheelchair and seating for home use.    Baseline met    Time 1    Period Days    Status Achieved                     Plan - 04/25/20 2018    Clinical Impression Statement Pt. is a pleasant 86 year old female who presents with weakness and high fall risk. Patient will benefit from the Merits Junior Power wheelchair due to her high fall risk, limited mobility, high pain levels, and weakened state. Patient is not a candidate for a manual wheelchair as her UE's are not strong enough to propel herself as well as her limited dexterity of her hands. Her trunk is not stable enough to safely allow her to utilize a scooter in combination with her back pain.  She is a high fall risk and is not safe ambulating with an AD. Her frequency of falling combined with her medications makes mobility without a power  chair unsafe. Her safety awareness is present, her high fall risk is due to generalized lack of strength, pain, and buckling of knees. A power wheelchair will allow for her to perform functions of daily life, reduce the number of urinary accidents, reduce her fall risk and improve her quality of life. A Merits Junior wheelchair is the best option for her at this time.  The standard seating and back rest will be optimal for the patient.  Adjustable height full length arm rests will allow for patient to stabilize herself, weight  shift, and perform ADLs in chair.  A center mount flip up footplate will allow patient to transfer and safely position LE's into proper alignment for reduced pain and improved safety. Flat free insert tires will reduce maintenance needs. In conclusion the Product manager Power wheelchair is the optimal choice for this patient as it will decrease her risk for falls, improve her functional mobility and independence with ADLs, and improve her quality of life.    Personal Factors and Comorbidities Age;Comorbidity 3+;Finances;Past/Current Experience;Time since onset of injury/illness/exacerbation;Transportation    Comorbidities A fib, bilateral OA of knees, carotid stenosis, DM, hepatitis, HLD, HTN, hypothyroidism, stroke, rheumatic fever, osteopenia, MCI.    Examination-Activity Limitations Bathing;Caring for Others;Carry;Reach Overhead;Locomotion Level;Lift;Squat;Stairs;Stand;Toileting;Transfers    Examination-Participation Restrictions Church;Cleaning;Community Activity;Driving;Interpersonal Relationship;Personal Finances;Meal Prep;Medication Management;Laundry;Shop;Volunteer;Yard Work    Stability/Clinical Decision Making Evolving/Moderate complexity    Clinical Decision Making Moderate    Rehab Potential Fair    PT Frequency One time visit    Consulted and Agree with Plan of Care Patient;Family member/caregiver    Family Member Consulted daughter           Patient will benefit  from skilled therapeutic intervention in order to improve the following deficits and impairments:  Decreased balance  Visit Diagnosis: Unsteadiness on feet  Difficulty in walking, not elsewhere classified  Muscle weakness (generalized)     Problem List Patient Active Problem List   Diagnosis Date Noted  . Urinary urgency 04/20/2020  . Recurrent falls 04/20/2020  . MCI (mild cognitive impairment) 05/13/2019  . Anemia 08/12/2017  . Weight loss 08/10/2017  . Encounter for general adult medical examination with abnormal findings 01/26/2016  . Advanced care planning/counseling discussion 01/26/2016  . Insomnia 01/26/2016  . Iron deficiency 01/26/2016  . Carotid stenosis 10/25/2015  . Osteopenia 10/25/2015  . Primary osteoarthritis of both knees 10/24/2015  . Type 2 diabetes mellitus with complication, with long-term current use of insulin ([place]) 10/24/2015  . Corns and callus 10/24/2015  . Hypothyroidism   . History of ischemic stroke without residual deficits 03/03/2012  . Atrial fibrillation ([place]) 03/03/2012   Janna Arch, PT, DPT   04/25/2020, 8:21 PM  [place] [facility] SERVICES [address] [place], [place], [address] Phone: [phone]   Fax:  [phone]  Name: [name] MRN: [id number] Date of Birth: [birth year]

## 2020-04-27 NOTE — Telephone Encounter (Signed)
Form processed and waiting on Dr. Cyndie Chime signature.  Will get this faxed in ASAP on Monday when Dr. Patsy Lager returns.

## 2020-05-01 NOTE — Telephone Encounter (Signed)
Dr. Patsy Lager,  After receiving the benefits review with both [employer] and [facility] Medicaid,   They are requiring a prior authorization and we need updated information in order to send them.  If PA is approved then looks like Medicaid will cover 100%, but we have to get the PA to go through.   Patient will need:  Updated visit documenting detailed history of bilateral knee OA Updated Xray views Updated list of tried and failed previous treatments Updated notation if any PT has been attempted   Ms. Leinweber will come in tomorrow at 240pm for you to update all information in order for [place] to move forward.  I have reached out to patient's daughter to make them aware that she will not be able to get injection yet until we obtain the PA and why appt is needed tomorrow.   Thanks.

## 2020-05-02 ENCOUNTER — Ambulatory Visit
Admission: RE | Admit: 2020-05-02 | Discharge: 2020-05-02 | Disposition: A | Discharge status: Home / Self Care | Payer: 59 | Source: Ambulatory Visit | Attending: Family Medicine | Admitting: Family Medicine

## 2020-05-02 ENCOUNTER — Encounter: Payer: Self-pay | Admitting: Family Medicine

## 2020-05-02 ENCOUNTER — Ambulatory Visit (INDEPENDENT_AMBULATORY_CARE_PROVIDER_SITE_OTHER)
Admission: RE | Admit: 2020-05-02 | Discharge: 2020-05-02 | Disposition: A | Discharge status: Home / Self Care | Payer: 59 | Source: Ambulatory Visit | Attending: Family Medicine | Admitting: Family Medicine

## 2020-05-02 ENCOUNTER — Ambulatory Visit (INDEPENDENT_AMBULATORY_CARE_PROVIDER_SITE_OTHER): Payer: 59 | Admitting: Family Medicine

## 2020-05-02 ENCOUNTER — Other Ambulatory Visit: Payer: Self-pay

## 2020-05-02 VITALS — BP 120/80 | HR 78 | Temp 98.1°F | Ht 63.0 in | Wt 130.0 lb

## 2020-05-02 DIAGNOSIS — M170 Bilateral primary osteoarthritis of knee: Secondary | ICD-10-CM

## 2020-05-02 NOTE — Progress Notes (Signed)
Tiphanie Vo T. Brynlea Spindler, MD, CAQ Sports Medicine  Primary Care and Sports Medicine [facility] at [facility] [address] [place], [address]  Phone: [phone]  FAX: [phone]  [name] - 87 y.o. female  MRN [id number]  Date of Birth: 1935/01/08  Date: 05/02/2020  PCP: Eustaquio Boyden, MD  Referral: Eustaquio Boyden, MD  Chief Complaint  Patient presents with  . Knee Pain    Bilateral-Documentation for PA    This visit occurred during the SARS-CoV-2 public health emergency.  Safety protocols were in place, including screening questions prior to the visit, additional usage of staff PPE, and extensive cleaning of exam room while observing appropriate contact time as indicated for disinfecting solutions.   Subjective:   [name] is a 86 y.o. very pleasant female patient with Body mass index is 23.03 kg/m. who presents with the following:  B severe knee OA: She has known knee osteoarthritis, and she presents in follow-up for significant bilateral osteoarthritis.  She does have significant pain bilaterally in the medial and lateral joint lines.  She has limitation in her motion, and she has functional impairment and pain daily.  Her gait is markedly abnormal.  She presents today with her daughter who provides some translation with the patient Spanish.  The patient has done well with hyaluronic acid injections previously.  She has had an excellent response to Synvisc 1 previously.  Motion to 95-100 degrees  Review of Systems is noted in the HPI, as appropriate   Objective:   BP 120/80   Pulse 78   Temp 98.1 F (36.7 C) (Temporal)   Ht 5\' 3"  (1.6 m)   Wt 130 lb (59 kg)   SpO2 94%   BMI 23.03 kg/m    Knee: Bilateral Gait: She is in a wheelchair at the time of my exam ROM: Bilaterally, the patient lacks 5 degrees of extension and she has flexion to 95 to 100 degrees. Effusion: Mild Echymosis or edema: none Patellar tendon  NT Painful PLICA: neg Patellar grind: Positive  medial and lateral patellar facet loading: Painful medial and lateral joint lines: Painful Mcmurray's painful  flexion-pinch painful  varus and valgus stress: stable Ant and Post drawer: neg Hip abduction, IR, ER: WNL Hip flexion str: 4/5 Hip abd: 4/5 Quad: 5/5 VMO atrophy:No Hamstring concentric and eccentric: 5/5   Radiology: No results found.   Assessment and Plan:     ICD-10-CM   1. Primary osteoarthritis of both knees  M17.0 DG Knee 4 Views W/Patella Right    DG Knee 4 Views W/Patella Left   Known osteoarthritis in the setting of severe osteoarthritis with pain in exacerbation and previously a history of excellent response to hyaluronic acid injections.  We talked about potential options, and at 31, the patient is not really interested in surgical intervention.  I think that this is entirely appropriate and she is able to make her decisions about her health.  On my independent review, the patient does have severe osteoarthritis, tricompartmental, bilateral knees.  Excellent response to hyaluronic acid in the past, we will plan on doing this in the near future.   Orders Placed This Encounter  Procedures  . DG Knee 4 Views W/Patella Right  . DG Knee 4 Views W/Patella Left    Follow-up: No follow-ups on file.  Signed,  91. Delilah Mulgrew, MD   Outpatient Encounter Medications as of 05/02/2020  Medication Sig  . atenolol (TENORMIN) 25 MG tablet Take 1 tablet (25  mg total) by mouth daily.  Marland Kitchen b complex vitamins capsule Take 1 capsule by mouth once a week.  . BD PEN NEEDLE NANO 2ND GEN 32G X 4 MM MISC USE TO ADMINISTER INSULIN AS DIRECTED  . Bioflavonoid Products (BIOFLEX) TABS Take 1 tablet by mouth daily.  . insulin glargine (LANTUS SOLOSTAR) 100 UNIT/ML Solostar Pen Inject 8 Units into the skin daily as needed.  Marland Kitchen levothyroxine (SYNTHROID) 75 MCG tablet Take 1 tablet (75 mcg total) by mouth daily before breakfast.  .  metFORMIN (GLUCOPHAGE) 500 MG tablet Take 1 tablet (500 mg total) by mouth daily with breakfast.  . polyethylene glycol powder (GLYCOLAX/MIRALAX) 17 GM/SCOOP powder Take 17 g by mouth daily as needed for moderate constipation.  . rivaroxaban (XARELTO) 20 MG TABS tablet Take 1 tablet (20 mg total) by mouth daily with supper.   No facility-administered encounter medications on file as of 05/02/2020.

## 2020-05-04 ENCOUNTER — Encounter: Payer: Self-pay | Admitting: Family Medicine

## 2020-05-09 ENCOUNTER — Telehealth: Payer: Self-pay

## 2020-05-09 NOTE — Telephone Encounter (Signed)
Received call from Marissa at [facility] concerning PA #, [id number], that was started for monovisc prescription for bilateral knee injections. Marissa verified NO PA is needed and monovisc is covered fully by this insurance. She will fax confirmation of no PA needed.

## 2020-05-11 NOTE — Telephone Encounter (Signed)
Received word on 05/09/20 (see phone encounter from Sherrie George) that Medicaid did approve coverage on the Monovisc at 100%.  I made Lupita Leash (CMA) aware as we have medication in stock, patient can now be scheduled.   Lupita Leash contacted patient's family to notify of approval.  They are asking for a copy of the Medicaid approval form due to the difficulty in past and being charged 4000.00.    As we received initial approval via phone, I am waiting on fax confirmation.  Will notify family once we have the faxed, written approval.   Thanks.

## 2020-05-11 NOTE — Telephone Encounter (Signed)
Noted, still waiting on faxed confirmation.  Thanks.

## 2020-05-29 NOTE — Telephone Encounter (Signed)
fyi

## 2020-06-04 ENCOUNTER — Ambulatory Visit (INDEPENDENT_AMBULATORY_CARE_PROVIDER_SITE_OTHER): Payer: 59 | Admitting: Family Medicine

## 2020-06-04 ENCOUNTER — Other Ambulatory Visit: Payer: Self-pay

## 2020-06-04 ENCOUNTER — Encounter: Payer: Self-pay | Admitting: Family Medicine

## 2020-06-04 VITALS — BP 120/80 | HR 68 | Temp 98.0°F | Ht 63.0 in | Wt 135.0 lb

## 2020-06-04 DIAGNOSIS — M170 Bilateral primary osteoarthritis of knee: Secondary | ICD-10-CM

## 2020-06-04 MED ORDER — HYALURONAN 88 MG/4ML IX SOSY
88.0000 mg | PREFILLED_SYRINGE | Freq: Once | INTRA_ARTICULAR | Status: AC
  Administered 2020-06-04: 88 mg via INTRA_ARTICULAR

## 2020-06-04 NOTE — Progress Notes (Signed)
    Shamiya Demeritt T. Zandria Woldt, MD, CAQ Sports Medicine  Primary Care and Sports Medicine [facility] at [facility] [address] [place], [address]  Phone: [phone]  FAX: [phone]  Markeda Narvaez - 86 y.o. female  MRN [id number]  Date of Birth: 09-Nov-1934  Date: 06/04/2020  PCP: Eustaquio Boyden, MD  Referral: Eustaquio Boyden, MD  Chief Complaint  Patient presents with  . Knee Pain    Bilateral Monovisc Injections    This visit occurred during the SARS-CoV-2 public health emergency.  Safety protocols were in place, including screening questions prior to the visit, additional usage of staff PPE, and extensive cleaning of exam room while observing appropriate contact time as indicated for disinfecting solutions.    Aspiration/Injection Procedure Note [name] Sep 28, 1934 Date of procedure: 06/04/2020  Procedure: Large Joint Aspiration / Injection of Knee for Viscosupplementation, R Medication: Monovisc Indications: Pain  Procedure Details Patient verbally consented to procedure. Risks, benefits, and alternatives explained. Sterilely prepped with Chloraprep. Ethyl cholride used for anesthesia, then 7 cc of Lidocaine 1% used for anesthesia in the anterolateral position. Reprepped with Chloraprep.  Anteromedial approach used to inject joint without difficulty, injected with Monovisc, 4 mL. No complications with procedure and tolerated well.  Aspiration/Injection Procedure Note [name] 1934/03/12 Date of procedure: 06/04/2020  Procedure: Large Joint Aspiration / Injection of Knee for Viscosupplementation, L Medication: Monovisc Indications: Pain  Procedure Details Patient verbally consented to procedure. Risks, benefits, and alternatives explained. Sterilely prepped with Chloraprep. Ethyl cholride used for anesthesia, then 7 cc of Lidocaine 1% used for anesthesia in the anterolateral position. Reprepped with Chloraprep.  Anteromedial approach  used to inject joint without difficulty, injected with Monovisc, 4 mL. No complications with procedure and tolerated well.    ICD-10-CM   1. Primary osteoarthritis of knees, bilateral  M17.0 Hyaluronan SOSY 88 mg    Hyaluronan SOSY 88 mg    Meds ordered this encounter  Medications  . Hyaluronan SOSY 88 mg  . Hyaluronan SOSY 88 mg    Follow-up: Return for 6 1/2 month follow-up with Dr. Patsy Lager.  Signed,  Elpidio Galea. Kynzley Dowson, MD

## 2020-06-05 ENCOUNTER — Encounter: Payer: Self-pay | Admitting: Family Medicine

## 2020-11-10 ENCOUNTER — Encounter: Payer: Self-pay | Admitting: Family Medicine

## 2020-11-12 NOTE — Telephone Encounter (Signed)
Rx written and in Lisa's box to mail to patient.

## 2020-11-14 NOTE — Telephone Encounter (Signed)
Mailed rx to pt

## 2020-11-15 ENCOUNTER — Encounter: Payer: Self-pay | Admitting: Family Medicine

## 2020-11-19 NOTE — Telephone Encounter (Signed)
Mailed written rx.

## 2020-12-17 ENCOUNTER — Ambulatory Visit: Payer: 59 | Admitting: Family Medicine

## 2021-03-02 ENCOUNTER — Other Ambulatory Visit: Payer: Self-pay | Admitting: Family Medicine

## 2021-03-12 ENCOUNTER — Ambulatory Visit: Payer: 59 | Admitting: Family Medicine

## 2021-03-16 ENCOUNTER — Other Ambulatory Visit: Payer: Self-pay | Admitting: Family Medicine

## 2021-04-05 ENCOUNTER — Encounter: Payer: Self-pay | Admitting: Family Medicine

## 2021-04-05 DIAGNOSIS — E039 Hypothyroidism, unspecified: Secondary | ICD-10-CM

## 2021-04-05 DIAGNOSIS — E118 Type 2 diabetes mellitus with unspecified complications: Secondary | ICD-10-CM

## 2021-04-05 DIAGNOSIS — E611 Iron deficiency: Secondary | ICD-10-CM

## 2021-04-05 DIAGNOSIS — R748 Abnormal levels of other serum enzymes: Secondary | ICD-10-CM

## 2021-04-05 DIAGNOSIS — I480 Paroxysmal atrial fibrillation: Secondary | ICD-10-CM

## 2021-04-10 ENCOUNTER — Encounter: Payer: Self-pay | Admitting: Family Medicine

## 2021-04-11 NOTE — Telephone Encounter (Signed)
Lillia Abed,   Can you help Mrs. Cagley get approval/cost for visco?  Then Lupita Leash can get her scheduled.   Last year, she had bilateral Monovisc injections with good success.  She does not speak [place], so we always talk to her daughter [name].   Thank-you!

## 2021-04-12 ENCOUNTER — Encounter: Payer: Self-pay | Admitting: Family Medicine

## 2021-04-12 ENCOUNTER — Ambulatory Visit (INDEPENDENT_AMBULATORY_CARE_PROVIDER_SITE_OTHER): Payer: 59 | Admitting: Family Medicine

## 2021-04-12 ENCOUNTER — Other Ambulatory Visit: Payer: Self-pay

## 2021-04-12 VITALS — BP 118/82 | HR 83 | Temp 97.6°F | Ht 61.5 in | Wt 131.1 lb

## 2021-04-12 DIAGNOSIS — E118 Type 2 diabetes mellitus with unspecified complications: Secondary | ICD-10-CM | POA: Diagnosis not present

## 2021-04-12 DIAGNOSIS — E611 Iron deficiency: Secondary | ICD-10-CM | POA: Diagnosis not present

## 2021-04-12 DIAGNOSIS — Z7189 Other specified counseling: Secondary | ICD-10-CM

## 2021-04-12 DIAGNOSIS — R296 Repeated falls: Secondary | ICD-10-CM

## 2021-04-12 DIAGNOSIS — Z794 Long term (current) use of insulin: Secondary | ICD-10-CM | POA: Diagnosis not present

## 2021-04-12 DIAGNOSIS — M170 Bilateral primary osteoarthritis of knee: Secondary | ICD-10-CM

## 2021-04-12 DIAGNOSIS — G3184 Mild cognitive impairment, so stated: Secondary | ICD-10-CM

## 2021-04-12 DIAGNOSIS — Z8673 Personal history of transient ischemic attack (TIA), and cerebral infarction without residual deficits: Secondary | ICD-10-CM

## 2021-04-12 DIAGNOSIS — D649 Anemia, unspecified: Secondary | ICD-10-CM

## 2021-04-12 DIAGNOSIS — I6529 Occlusion and stenosis of unspecified carotid artery: Secondary | ICD-10-CM

## 2021-04-12 DIAGNOSIS — I480 Paroxysmal atrial fibrillation: Secondary | ICD-10-CM | POA: Diagnosis not present

## 2021-04-12 DIAGNOSIS — Z0001 Encounter for general adult medical examination with abnormal findings: Secondary | ICD-10-CM | POA: Diagnosis not present

## 2021-04-12 DIAGNOSIS — R7989 Other specified abnormal findings of blood chemistry: Secondary | ICD-10-CM

## 2021-04-12 DIAGNOSIS — F5101 Primary insomnia: Secondary | ICD-10-CM

## 2021-04-12 DIAGNOSIS — R748 Abnormal levels of other serum enzymes: Secondary | ICD-10-CM | POA: Diagnosis not present

## 2021-04-12 DIAGNOSIS — E039 Hypothyroidism, unspecified: Secondary | ICD-10-CM | POA: Diagnosis not present

## 2021-04-12 DIAGNOSIS — M8580 Other specified disorders of bone density and structure, unspecified site: Secondary | ICD-10-CM

## 2021-04-12 DIAGNOSIS — R531 Weakness: Secondary | ICD-10-CM

## 2021-04-12 LAB — HEMOGLOBIN A1C: Hgb A1c MFr Bld: 8.3 % — ABNORMAL HIGH (ref 4.6–6.5)

## 2021-04-12 LAB — MICROALBUMIN / CREATININE URINE RATIO
Creatinine,U: 85.5 mg/dL
Microalb Creat Ratio: 2.3 mg/g (ref 0.0–30.0)
Microalb, Ur: 2 mg/dL — ABNORMAL HIGH (ref 0.0–1.9)

## 2021-04-12 LAB — COMPREHENSIVE METABOLIC PANEL
ALT: 8 U/L (ref 0–35)
AST: 14 U/L (ref 0–37)
Albumin: 4.1 g/dL (ref 3.5–5.2)
Alkaline Phosphatase: 57 U/L (ref 39–117)
BUN: 13 mg/dL (ref 6–23)
CO2: 31 mEq/L (ref 19–32)
Calcium: 9.4 mg/dL (ref 8.4–10.5)
Chloride: 98 mEq/L (ref 96–112)
Creatinine, Ser: 0.75 mg/dL (ref 0.40–1.20)
GFR: 73.1 mL/min (ref >60.00)
Glucose, Bld: 101 mg/dL — ABNORMAL HIGH (ref 70–99)
Potassium: 4.8 mEq/L (ref 3.5–5.1)
Sodium: 134 mEq/L — ABNORMAL LOW (ref 135–145)
Total Bilirubin: 0.4 mg/dL (ref 0.2–1.2)
Total Protein: 6.3 g/dL (ref 6.0–8.3)

## 2021-04-12 LAB — LIPID PANEL
Cholesterol: 135 mg/dL (ref 0–200)
HDL: 42.4 mg/dL (ref >39.00)
LDL Cholesterol: 72 mg/dL (ref 0–99)
NonHDL: 92.64
Total CHOL/HDL Ratio: 3
Triglycerides: 102 mg/dL (ref 0.0–149.0)
VLDL: 20.4 mg/dL (ref 0.0–40.0)

## 2021-04-12 LAB — T4, FREE: Free T4: 0.9 ng/dL (ref 0.60–1.60)

## 2021-04-12 LAB — CBC WITH DIFFERENTIAL/PLATELET
Basophils Absolute: 0.1 10*3/uL (ref 0.0–0.1)
Basophils Relative: 0.8 % (ref 0.0–3.0)
Eosinophils Absolute: 0.1 10*3/uL (ref 0.0–0.7)
Eosinophils Relative: 0.6 % (ref 0.0–5.0)
HCT: 30.7 % — ABNORMAL LOW (ref 36.0–46.0)
Hemoglobin: 9.8 g/dL — ABNORMAL LOW (ref 12.0–15.0)
Lymphocytes Relative: 21.3 % (ref 12.0–46.0)
Lymphs Abs: 1.8 10*3/uL (ref 0.7–4.0)
MCHC: 32 g/dL (ref 30.0–36.0)
MCV: 78.3 fl (ref 78.0–100.0)
Monocytes Absolute: 0.4 10*3/uL (ref 0.1–1.0)
Monocytes Relative: 5.4 % (ref 3.0–12.0)
Neutro Abs: 5.9 10*3/uL (ref 1.4–7.7)
Neutrophils Relative %: 71.9 % (ref 43.0–77.0)
Platelets: 237 10*3/uL (ref 150.0–400.0)
RBC: 3.92 Mil/uL (ref 3.87–5.11)
RDW: 14.5 % (ref 11.5–15.5)
WBC: 8.2 10*3/uL (ref 4.0–10.5)

## 2021-04-12 LAB — FERRITIN: Ferritin: 5 ng/mL — ABNORMAL LOW (ref 10.0–291.0)

## 2021-04-12 LAB — IBC PANEL
Iron: 25 ug/dL — ABNORMAL LOW (ref 42–145)
Saturation Ratios: 6.2 % — ABNORMAL LOW (ref 20.0–50.0)
TIBC: 403.2 ug/dL (ref 250.0–450.0)
Transferrin: 288 mg/dL (ref 212.0–360.0)

## 2021-04-12 LAB — VITAMIN B12: Vitamin B-12: >1504 pg/mL — ABNORMAL HIGH (ref 211–911)

## 2021-04-12 LAB — TSH: TSH: 2.25 u[IU]/mL (ref 0.35–5.50)

## 2021-04-12 MED ORDER — TRAZODONE HCL 50 MG PO TABS: 25.0000 mg | ORAL_TABLET | Freq: Every day | ORAL | 3 refills | Status: DC

## 2021-04-12 NOTE — Patient Instructions (Addendum)
Labs today  Return in 6 months for diabetes follow up visit.

## 2021-04-12 NOTE — Progress Notes (Signed)
Patient ID: [name], female    DOB: 06/22/1934, 87 y.o.   MRN: [id number]  This visit was conducted in person.  BP 118/82    Pulse 83    Temp 97.6 F (36.4 C) (Temporal)    Ht 5' 1.5" (1.562 m)    Wt 131 lb 2 oz (59.5 kg)    SpO2 100%    BMI 24.37 kg/m    CC: CPE  Subjective:   HPI: [name] is a 87 y.o. female presenting on 04/12/2021 for Annual Exam (Accompanied by daughter [name])   Last physical was 04/20/2020. She has since changed insurance.  Received hyaluronic acid injections to bilateral knees 06/2020. They are interested in rpt injections. Currently using knee pads. They will reach out to Dr Lorelei Pont.  Just came from 57 month [place] stay with one of her children.   Saw cardiology Dr Ubaldo Glassing 04/2020 - persistent atrial fibrillation on xarelto, monitoring carotid stenosis. Upcoming appt next month.   Saw neurology Dr Manuella Ghazi 07/2019.   She has lift rail stair chair at home as well as portable wheelchair, raised commode seat, shower chair and hand rail.  They requests Rx for outpatient PT. Sometimes returns deconditioned after stays with other children.  Last fall was 1.5 yrs ago.   DM - continues 8 units lantus daily and metformin 500mg  daily.   Did not see health advisor this year.   No results found.    No flowsheet data found.    Preventative: Colon cancer screening - s/p colonoscopy 2013 WNL per patient in [place]. Aged out.  Breast cancer screening - mammo remotely WNL. Declines rpt. Does check breast exams at home without concerns. No fmhx breast cancer.  Well woman exam - s/p hysterectomy with BSO. Underwent premarin treatment after menopause.  DEXA scan - 2013 normal per patient Lung cancer screening - not eligible Flu shot - declines due to bad reaction in the past  COVID vaccine J&J 06/2019, booster 10/2019 Prevnar-13 01/2016. Pneumovax - 08/2017.  Shingles shot - discussed, declines  Advanced directive discussion - does not have living will. Discussed.  Packet provided today (Spanish). Does not want dialysis. [place] with temporary life support, does not want prolonged life support.  Seat belt use discussed Sunscreen use discussed. No changing moles on skin.  Non-smoker  Alcohol - rare  Dentist - has dentures Eye exam - due - pending Bowels - constipation - managed with water/fiber and soluble fiber supplement.  Bladder - no incontinence    Lives with daughter [name], stays extended periods with other children across the [place]  Originally from [place], moved 2013 G5P5 Activity: stationary bicycle 45 min/day - has stopped this Diet: good water, fruits/vegetables daily     Relevant past medical, surgical, family and social history reviewed and updated as indicated. Interim medical history since our last visit reviewed. Allergies and medications reviewed and updated. Outpatient Medications Prior to Visit  Medication Sig Dispense Refill   atenolol (TENORMIN) 25 MG tablet TAKE 1 TABLET (25 MG TOTAL) BY MOUTH DAILY. 90 tablet 0   b complex vitamins capsule Take 1 capsule by mouth once a week.     BD PEN NEEDLE NANO 2ND GEN 32G X 4 MM MISC USE TO ADMINISTER INSULIN AS DIRECTED 100 each 1   Bioflavonoid Products (BIOFLEX) TABS Take 1 tablet by mouth daily.     insulin glargine (LANTUS SOLOSTAR) 100 UNIT/ML Solostar Pen Inject 8 Units into the skin daily as needed. 15 mL  3   levothyroxine (SYNTHROID) 75 MCG tablet TAKE 1 TABLET BY MOUTH DAILY BEFORE BREAKFAST. 90 tablet 0   metFORMIN (GLUCOPHAGE) 500 MG tablet Take 1 tablet (500 mg total) by mouth daily with breakfast. 90 tablet 3   polyethylene glycol powder (GLYCOLAX/MIRALAX) 17 GM/SCOOP powder Take 17 g by mouth daily as needed for moderate constipation. 1700 g 1   rivaroxaban (XARELTO) 20 MG TABS tablet Take 1 tablet (20 mg total) by mouth daily with supper. 90 tablet 3   No facility-administered medications prior to visit.     Per HPI unless specifically indicated in ROS  section below Review of Systems  Objective:  BP 118/82    Pulse 83    Temp 97.6 F (36.4 C) (Temporal)    Ht 5' 1.5" (1.562 m)    Wt 131 lb 2 oz (59.5 kg)    SpO2 100%    BMI 24.37 kg/m   Wt Readings from Last 3 Encounters:  04/12/21 131 lb 2 oz (59.5 kg)  06/04/20 135 lb (61.2 kg)  05/02/20 130 lb (59 kg)      Physical Exam Vitals and nursing note reviewed.  Constitutional:      Appearance: Normal appearance. She is not ill-appearing.     Comments: Ambulates with rollator  HENT:     Head: Normocephalic and atraumatic.     Right Ear: Tympanic membrane, ear canal and external ear normal. There is no impacted cerumen.     Left Ear: Tympanic membrane, ear canal and external ear normal. There is no impacted cerumen.  Eyes:     General:        Right eye: No discharge.        Left eye: No discharge.     Extraocular Movements: Extraocular movements intact.     Conjunctiva/sclera: Conjunctivae normal.     Pupils: Pupils are equal, round, and reactive to light.  Neck:     Thyroid: No thyroid mass or thyromegaly.     Vascular: No carotid bruit.  Cardiovascular:     Rate and Rhythm: Normal rate. Rhythm irregular.     Pulses: Normal pulses.     Heart sounds: Normal heart sounds. No murmur heard. Pulmonary:     Effort: Pulmonary effort is normal. No respiratory distress.     Breath sounds: Normal breath sounds. No wheezing, rhonchi or rales.  Abdominal:     General: Bowel sounds are normal. There is no distension.     Palpations: Abdomen is soft. There is no mass.     Tenderness: There is no abdominal tenderness. There is no guarding or rebound.     Hernia: No hernia is present.  Musculoskeletal:     Cervical back: Normal range of motion and neck supple. No rigidity.     Right lower leg: No edema.     Left lower leg: No edema.  Lymphadenopathy:     Cervical: No cervical adenopathy.  Skin:    General: Skin is warm and dry.     Findings: No rash.  Neurological:     General: No  focal deficit present.     Mental Status: She is alert. Mental status is at baseline.  Psychiatric:        Mood and Affect: Mood normal.        Behavior: Behavior normal.      Results for orders placed or performed in visit on 04/12/21  Vitamin B12  Result Value Ref Range   Vitamin B-12 >1504 (H) 211 -  911 pg/mL  Microalbumin / creatinine urine ratio  Result Value Ref Range   Microalb, Ur 2.0 (H) 0.0 - 1.9 mg/dL   Creatinine,U 85.5 mg/dL   Microalb Creat Ratio 2.3 0.0 - 30.0 mg/g  T4, free  Result Value Ref Range   Free T4 0.90 0.60 - 1.60 ng/dL  CBC with Differential/Platelet  Result Value Ref Range   WBC 8.2 4.0 - 10.5 K/uL   RBC 3.92 3.87 - 5.11 Mil/uL   Hemoglobin 9.8 (L) 12.0 - 15.0 g/dL   HCT 30.7 (L) 36.0 - 46.0 %   MCV 78.3 78.0 - 100.0 fl   MCHC 32.0 30.0 - 36.0 g/dL   RDW 14.5 11.5 - 15.5 %   Platelets 237.0 150.0 - 400.0 K/uL   Neutrophils Relative % 71.9 43.0 - 77.0 %   Lymphocytes Relative 21.3 12.0 - 46.0 %   Monocytes Relative 5.4 3.0 - 12.0 %   Eosinophils Relative 0.6 0.0 - 5.0 %   Basophils Relative 0.8 0.0 - 3.0 %   Neutro Abs 5.9 1.4 - 7.7 K/uL   Lymphs Abs 1.8 0.7 - 4.0 K/uL   Monocytes Absolute 0.4 0.1 - 1.0 K/uL   Eosinophils Absolute 0.1 0.0 - 0.7 K/uL   Basophils Absolute 0.1 0.0 - 0.1 K/uL  Hemoglobin A1c  Result Value Ref Range   Hgb A1c MFr Bld 8.3 (H) 4.6 - 6.5 %  TSH  Result Value Ref Range   TSH 2.25 0.35 - 5.50 uIU/mL  Lipid panel  Result Value Ref Range   Cholesterol 135 0 - 200 mg/dL   Triglycerides 102.0 0.0 - 149.0 mg/dL   HDL 42.40 >39.00 mg/dL   VLDL 20.4 0.0 - 40.0 mg/dL   LDL Cholesterol 72 0 - 99 mg/dL   Total CHOL/HDL Ratio 3    NonHDL 92.64   Comprehensive metabolic panel  Result Value Ref Range   Sodium 134 (L) 135 - 145 mEq/L   Potassium 4.8 3.5 - 5.1 mEq/L   Chloride 98 96 - 112 mEq/L   CO2 31 19 - 32 mEq/L   Glucose, Bld 101 (H) 70 - 99 mg/dL   BUN 13 6 - 23 mg/dL   Creatinine, Ser 0.75 0.40 - 1.20 mg/dL    Total Bilirubin 0.4 0.2 - 1.2 mg/dL   Alkaline Phosphatase 57 39 - 117 U/L   AST 14 0 - 37 U/L   ALT 8 0 - 35 U/L   Total Protein 6.3 6.0 - 8.3 g/dL   Albumin 4.1 3.5 - 5.2 g/dL   GFR 73.10 >60.00 mL/min   Calcium 9.4 8.4 - 10.5 mg/dL  Ferritin  Result Value Ref Range   Ferritin 5.0 (L) 10.0 - 291.0 ng/mL  IBC panel  Result Value Ref Range   Iron 25 (L) 42 - 145 ug/dL   Transferrin 288.0 212.0 - 360.0 mg/dL   Saturation Ratios 6.2 (L) 20.0 - 50.0 %   TIBC 403.2 250.0 - 450.0 mcg/dL    Assessment & Plan:  This visit occurred during the SARS-CoV-2 public health emergency.  Safety protocols were in place, including screening questions prior to the visit, additional usage of staff PPE, and extensive cleaning of exam room while observing appropriate contact time as indicated for disinfecting solutions.   Problem List Items Addressed This Visit     Encounter for general adult medical examination with abnormal findings - Primary (Chronic)    Preventative protocols reviewed and updated unless pt declined. Discussed healthy diet and lifestyle.  Advanced care planning/counseling discussion (Chronic)    Advanced directive discussion - does not have living will at home. Discussed. She doesn't want to fill out but daughter will work towards this. Packet in [place] provided today. Would not want dialysis. [place] with temporary life support, does not want prolonged life support. also discussed palliative care home referral as well as outpatient PT referral - she would benefit given progressive weakness noted today.       Relevant Orders   Amb Referral to Palliative Care   Primary osteoarthritis of both knees    They will touch base with sports medicien for further management recommendations.       Relevant Orders   Ambulatory referral to Physical Therapy   Type 2 diabetes mellitus with complication, with long-term current use of insulin (HCC)    Chronic, continues lantus 8u daily and  metformin low dose. Will update A1c.       Relevant Orders   Amb Referral to Palliative Care   Ambulatory referral to Physical Therapy   History of ischemic stroke without residual deficits    Continues xarelto in h/o stroke and afib      Relevant Orders   Amb Referral to Palliative Care   Hypothyroidism    Update levels on lowe dose levothyroxine replacement.       Carotid stenosis    They will touch base with cardiology about repeat testing.       Osteopenia    Per patient, normal DEXA in [place] > 10 yrs ago.  Consider updating.       Atrial fibrillation ([place])    Continue xarelto.       Relevant Orders   Amb Referral to Palliative Care   Ambulatory referral to Physical Therapy   Insomnia    Requests refill of trazodone given ongoing insomnia.       Iron deficiency    Update levels off iron replacement.       Relevant Orders   Ambulatory referral to Physical Therapy   Anemia    Update blood work      Relevant Orders   Ambulatory referral to Physical Therapy   MCI (mild cognitive impairment)    Followed by neurology. Appreciate Dr Trena Platt care.       Relevant Orders   Amb Referral to Palliative Care   Recurrent falls    No recent falls      General weakness    Notes progressive weakness - will refer back for outpatient PT which she has previously benefited from.      Relevant Orders   Amb Referral to Palliative Care   Ambulatory referral to Physical Therapy   Other Visit Diagnoses     Elevated vitamin B12 level            Meds ordered this encounter  Medications   traZODone (DESYREL) 50 MG tablet    Sig: Take 0.5-1 tablets (25-50 mg total) by mouth at bedtime.    Dispense:  90 tablet    Refill:  3   Orders Placed This Encounter  Procedures   Amb Referral to Palliative Care    Referral Priority:   Routine    Referral Type:   Consultation    Number of Visits Requested:   1   Ambulatory referral to Physical Therapy    Referral  Priority:   Routine    Referral Type:   Physical Medicine    Referral Reason:   Specialty Services Required  Requested Specialty:   Physical Therapy    Number of Visits Requested:   1    Patient Instructions  Labs today  Return in 6 months for diabetes follow up visit.   Follow up plan: Return in about 6 months (around 10/10/2021), or if symptoms worsen or fail to improve, for follow up visit.  Ria Bush, MD

## 2021-04-13 ENCOUNTER — Encounter: Payer: Self-pay | Admitting: Family Medicine

## 2021-04-13 ENCOUNTER — Other Ambulatory Visit: Payer: Self-pay | Admitting: Family Medicine

## 2021-04-13 DIAGNOSIS — R531 Weakness: Secondary | ICD-10-CM | POA: Insufficient documentation

## 2021-04-13 DIAGNOSIS — D509 Iron deficiency anemia, unspecified: Secondary | ICD-10-CM

## 2021-04-13 MED ORDER — METFORMIN HCL 500 MG PO TABS: 500.0000 mg | ORAL_TABLET | Freq: Two times a day (BID) | ORAL | 3 refills | Status: DC

## 2021-04-13 MED ORDER — IRON (FERROUS SULFATE) 325 (65 FE) MG PO TABS: 325.0000 mg | ORAL_TABLET | ORAL | Status: DC

## 2021-04-13 NOTE — Assessment & Plan Note (Signed)
Chronic, continues lantus 8u daily and metformin low dose. Will update A1c.

## 2021-04-13 NOTE — Assessment & Plan Note (Signed)
Requests refill of trazodone given ongoing insomnia.

## 2021-04-13 NOTE — Assessment & Plan Note (Addendum)
They will touch base with sports medicien for further management recommendations.

## 2021-04-13 NOTE — Assessment & Plan Note (Addendum)
Continue xarelto

## 2021-04-13 NOTE — Assessment & Plan Note (Addendum)
Per patient, normal DEXA in [place] > 10 yrs ago.  Consider updating.

## 2021-04-13 NOTE — Assessment & Plan Note (Signed)
They will touch base with cardiology about repeat testing.

## 2021-04-13 NOTE — Assessment & Plan Note (Signed)
Update levels on lowe dose levothyroxine replacement.

## 2021-04-13 NOTE — Assessment & Plan Note (Signed)
Notes progressive weakness - will refer back for outpatient PT which she has previously benefited from.

## 2021-04-13 NOTE — Assessment & Plan Note (Signed)
Continues xarelto in h/o stroke and afib

## 2021-04-13 NOTE — Assessment & Plan Note (Addendum)
Update blood work.

## 2021-04-13 NOTE — Assessment & Plan Note (Signed)
Preventative protocols reviewed and updated unless pt declined. Discussed healthy diet and lifestyle.  

## 2021-04-13 NOTE — Assessment & Plan Note (Addendum)
Advanced directive discussion - does not have living will at home. Discussed. She doesn't want to fill out but daughter will work towards this. Packet in spanish provided today. Would not want dialysis. [place] with temporary life support, does not want prolonged life support. also discussed palliative care home referral as well as outpatient PT referral - she would benefit given progressive weakness noted today.

## 2021-04-13 NOTE — Assessment & Plan Note (Signed)
No recent falls 

## 2021-04-13 NOTE — Assessment & Plan Note (Signed)
Followed by neurology. Appreciate Dr Margaretmary Eddy care.

## 2021-04-13 NOTE — Assessment & Plan Note (Signed)
Update levels off iron replacement.  

## 2021-04-14 ENCOUNTER — Telehealth: Payer: Self-pay | Admitting: Family Medicine

## 2021-04-14 MED ORDER — VITAMIN B-12 1000 MCG PO TABS: 1000.0000 ug | ORAL_TABLET | ORAL | Status: DC

## 2021-04-14 NOTE — Telephone Encounter (Signed)
Pt's daughter is asking about possibility of rpt hyaluronic acid injections into knees for pt. I said I would forward you a note.

## 2021-04-14 NOTE — Telephone Encounter (Signed)
Pt's daughter is asking about possibility of rpt hyaluronic acid injections into knees for pt. I said I would forward you a note.  °

## 2021-04-16 NOTE — Telephone Encounter (Signed)
We are already working on it.  We already communicated with her through mychart.  Lupita Leash, you can you let her know that we are working on it, and we will let know about approval/insurance when it is done.

## 2021-04-17 NOTE — Telephone Encounter (Signed)
Left message on answering machine requesting that patient's daughter call the office back.

## 2021-04-17 NOTE — Telephone Encounter (Signed)
Patient's daughter notified as instructed by telephone and verbalized understanding. Patient's daughter stated that she has been frustrated trying to get this taken care of.  Patient's daughter [name] requested that this message be sent to [facility] since she has been the person to get this approved previously. [name] stated that her mom now has [place] and Medicaid.

## 2021-04-17 NOTE — Telephone Encounter (Signed)
Left message on voicemail for patient or daughter to call the office back.

## 2021-04-17 NOTE — Telephone Encounter (Signed)
Patient's daughter Juliann Pulse) [phone] is returning our call  States she was asked to call back  Please call her, she says she will be waiting by the phone

## 2021-04-22 NOTE — Telephone Encounter (Signed)
I spoke with patient's daughter [name] and gave her an update.   We are processing the visco application through her insurance; however, Dr. Lorelei Pont will need to sign and complete this medical hx piece.    He is out of the state for past several days and will not be back until Wednesday to complete.  I will keep in touch with daughter [name] to make her aware once this has been processed.   Thanks.

## 2021-04-25 ENCOUNTER — Inpatient Hospital Stay
Admission: EM | Admit: 2021-04-25 | Discharge: 2021-05-02 | DRG: 482 | Disposition: A | Discharge status: Home Health | Payer: 59 | Attending: Internal Medicine | Admitting: Internal Medicine

## 2021-04-25 ENCOUNTER — Emergency Department: Payer: 59

## 2021-04-25 ENCOUNTER — Other Ambulatory Visit: Payer: Self-pay

## 2021-04-25 ENCOUNTER — Encounter: Payer: Self-pay | Admitting: Emergency Medicine

## 2021-04-25 DIAGNOSIS — T148XXA Other injury of unspecified body region, initial encounter: Secondary | ICD-10-CM

## 2021-04-25 DIAGNOSIS — D72829 Elevated white blood cell count, unspecified: Secondary | ICD-10-CM | POA: Diagnosis present

## 2021-04-25 DIAGNOSIS — S40021A Contusion of right upper arm, initial encounter: Secondary | ICD-10-CM | POA: Diagnosis present

## 2021-04-25 DIAGNOSIS — G301 Alzheimer's disease with late onset: Secondary | ICD-10-CM | POA: Diagnosis not present

## 2021-04-25 DIAGNOSIS — Z7901 Long term (current) use of anticoagulants: Secondary | ICD-10-CM | POA: Diagnosis not present

## 2021-04-25 DIAGNOSIS — M25551 Pain in right hip: Secondary | ICD-10-CM | POA: Diagnosis not present

## 2021-04-25 DIAGNOSIS — Y92531 Health care provider office as the place of occurrence of the external cause: Secondary | ICD-10-CM

## 2021-04-25 DIAGNOSIS — Z20822 Contact with and (suspected) exposure to covid-19: Secondary | ICD-10-CM | POA: Diagnosis present

## 2021-04-25 DIAGNOSIS — D6489 Other specified anemias: Secondary | ICD-10-CM | POA: Diagnosis present

## 2021-04-25 DIAGNOSIS — Z794 Long term (current) use of insulin: Secondary | ICD-10-CM

## 2021-04-25 DIAGNOSIS — Z7984 Long term (current) use of oral hypoglycemic drugs: Secondary | ICD-10-CM

## 2021-04-25 DIAGNOSIS — I4891 Unspecified atrial fibrillation: Secondary | ICD-10-CM | POA: Diagnosis not present

## 2021-04-25 DIAGNOSIS — M25561 Pain in right knee: Secondary | ICD-10-CM

## 2021-04-25 DIAGNOSIS — E119 Type 2 diabetes mellitus without complications: Secondary | ICD-10-CM | POA: Diagnosis present

## 2021-04-25 DIAGNOSIS — E118 Type 2 diabetes mellitus with unspecified complications: Secondary | ICD-10-CM | POA: Diagnosis not present

## 2021-04-25 DIAGNOSIS — S40021D Contusion of right upper arm, subsequent encounter: Secondary | ICD-10-CM

## 2021-04-25 DIAGNOSIS — G4700 Insomnia, unspecified: Secondary | ICD-10-CM | POA: Diagnosis present

## 2021-04-25 DIAGNOSIS — G309 Alzheimer's disease, unspecified: Secondary | ICD-10-CM | POA: Diagnosis not present

## 2021-04-25 DIAGNOSIS — S72141A Displaced intertrochanteric fracture of right femur, initial encounter for closed fracture: Principal | ICD-10-CM | POA: Diagnosis present

## 2021-04-25 DIAGNOSIS — F5101 Primary insomnia: Secondary | ICD-10-CM | POA: Diagnosis not present

## 2021-04-25 DIAGNOSIS — Z79899 Other long term (current) drug therapy: Secondary | ICD-10-CM | POA: Diagnosis not present

## 2021-04-25 DIAGNOSIS — R5381 Other malaise: Secondary | ICD-10-CM | POA: Diagnosis not present

## 2021-04-25 DIAGNOSIS — E611 Iron deficiency: Secondary | ICD-10-CM | POA: Diagnosis present

## 2021-04-25 DIAGNOSIS — S72001A Fracture of unspecified part of neck of right femur, initial encounter for closed fracture: Secondary | ICD-10-CM

## 2021-04-25 DIAGNOSIS — W010XXA Fall on same level from slipping, tripping and stumbling without subsequent striking against object, initial encounter: Secondary | ICD-10-CM | POA: Diagnosis not present

## 2021-04-25 DIAGNOSIS — D72828 Other elevated white blood cell count: Secondary | ICD-10-CM | POA: Diagnosis present

## 2021-04-25 DIAGNOSIS — E785 Hyperlipidemia, unspecified: Secondary | ICD-10-CM | POA: Diagnosis not present

## 2021-04-25 DIAGNOSIS — R69 Illness, unspecified: Secondary | ICD-10-CM | POA: Diagnosis not present

## 2021-04-25 DIAGNOSIS — Z743 Need for continuous supervision: Secondary | ICD-10-CM | POA: Diagnosis not present

## 2021-04-25 DIAGNOSIS — M7989 Other specified soft tissue disorders: Secondary | ICD-10-CM | POA: Diagnosis not present

## 2021-04-25 DIAGNOSIS — S8001XA Contusion of right knee, initial encounter: Secondary | ICD-10-CM | POA: Diagnosis not present

## 2021-04-25 DIAGNOSIS — E039 Hypothyroidism, unspecified: Secondary | ICD-10-CM | POA: Diagnosis present

## 2021-04-25 DIAGNOSIS — M1711 Unilateral primary osteoarthritis, right knee: Secondary | ICD-10-CM | POA: Diagnosis not present

## 2021-04-25 DIAGNOSIS — R296 Repeated falls: Secondary | ICD-10-CM | POA: Diagnosis not present

## 2021-04-25 DIAGNOSIS — R000 Tachycardia, unspecified: Secondary | ICD-10-CM | POA: Diagnosis not present

## 2021-04-25 DIAGNOSIS — Z8673 Personal history of transient ischemic attack (TIA), and cerebral infarction without residual deficits: Secondary | ICD-10-CM | POA: Diagnosis not present

## 2021-04-25 DIAGNOSIS — M170 Bilateral primary osteoarthritis of knee: Secondary | ICD-10-CM | POA: Diagnosis present

## 2021-04-25 DIAGNOSIS — Z79890 Hormone replacement therapy (postmenopausal): Secondary | ICD-10-CM | POA: Diagnosis not present

## 2021-04-25 DIAGNOSIS — M549 Dorsalgia, unspecified: Secondary | ICD-10-CM | POA: Diagnosis not present

## 2021-04-25 DIAGNOSIS — F0150 Vascular dementia without behavioral disturbance: Secondary | ICD-10-CM | POA: Diagnosis not present

## 2021-04-25 DIAGNOSIS — G3184 Mild cognitive impairment, so stated: Secondary | ICD-10-CM | POA: Diagnosis present

## 2021-04-25 DIAGNOSIS — S72009A Fracture of unspecified part of neck of unspecified femur, initial encounter for closed fracture: Secondary | ICD-10-CM

## 2021-04-25 DIAGNOSIS — R2689 Other abnormalities of gait and mobility: Secondary | ICD-10-CM | POA: Diagnosis not present

## 2021-04-25 DIAGNOSIS — I480 Paroxysmal atrial fibrillation: Secondary | ICD-10-CM | POA: Diagnosis not present

## 2021-04-25 DIAGNOSIS — I639 Cerebral infarction, unspecified: Secondary | ICD-10-CM | POA: Diagnosis not present

## 2021-04-25 DIAGNOSIS — I10 Essential (primary) hypertension: Secondary | ICD-10-CM | POA: Diagnosis not present

## 2021-04-25 DIAGNOSIS — D649 Anemia, unspecified: Secondary | ICD-10-CM | POA: Diagnosis present

## 2021-04-25 DIAGNOSIS — D509 Iron deficiency anemia, unspecified: Secondary | ICD-10-CM | POA: Diagnosis not present

## 2021-04-25 DIAGNOSIS — M25462 Effusion, left knee: Secondary | ICD-10-CM | POA: Diagnosis not present

## 2021-04-25 DIAGNOSIS — R29818 Other symptoms and signs involving the nervous system: Secondary | ICD-10-CM | POA: Diagnosis not present

## 2021-04-25 DIAGNOSIS — R531 Weakness: Secondary | ICD-10-CM | POA: Diagnosis not present

## 2021-04-25 DIAGNOSIS — R52 Pain, unspecified: Secondary | ICD-10-CM | POA: Diagnosis not present

## 2021-04-25 DIAGNOSIS — R29898 Other symptoms and signs involving the musculoskeletal system: Secondary | ICD-10-CM | POA: Diagnosis not present

## 2021-04-25 DIAGNOSIS — R350 Frequency of micturition: Secondary | ICD-10-CM | POA: Diagnosis present

## 2021-04-25 DIAGNOSIS — M1712 Unilateral primary osteoarthritis, left knee: Secondary | ICD-10-CM | POA: Diagnosis not present

## 2021-04-25 DIAGNOSIS — Z9889 Other specified postprocedural states: Secondary | ICD-10-CM | POA: Diagnosis not present

## 2021-04-25 DIAGNOSIS — I4819 Other persistent atrial fibrillation: Secondary | ICD-10-CM | POA: Diagnosis not present

## 2021-04-25 DIAGNOSIS — Z043 Encounter for examination and observation following other accident: Secondary | ICD-10-CM | POA: Diagnosis not present

## 2021-04-25 LAB — BASIC METABOLIC PANEL
Anion gap: 3 — ABNORMAL LOW (ref 5–15)
BUN: 16 mg/dL (ref 8–23)
CO2: 18 mmol/L — ABNORMAL LOW (ref 22–32)
Calcium: 6.5 mg/dL — ABNORMAL LOW (ref 8.9–10.3)
Chloride: 113 mmol/L — ABNORMAL HIGH (ref 98–111)
Creatinine, Ser: 0.66 mg/dL (ref 0.44–1.00)
GFR, Estimated: >60 mL/min (ref >60)
Glucose, Bld: 80 mg/dL (ref 70–99)
Potassium: 3.6 mmol/L (ref 3.5–5.1)
Sodium: 134 mmol/L — ABNORMAL LOW (ref 135–145)

## 2021-04-25 LAB — CBC
HCT: 27.7 % — ABNORMAL LOW (ref 36.0–46.0)
Hemoglobin: 8.4 g/dL — ABNORMAL LOW (ref 12.0–15.0)
MCH: 24.8 pg — ABNORMAL LOW (ref 26.0–34.0)
MCHC: 30.3 g/dL (ref 30.0–36.0)
MCV: 81.7 fL (ref 80.0–100.0)
Platelets: 218 10*3/uL (ref 150–400)
RBC: 3.39 MIL/uL — ABNORMAL LOW (ref 3.87–5.11)
RDW: 14 % (ref 11.5–15.5)
WBC: 13.6 10*3/uL — ABNORMAL HIGH (ref 4.0–10.5)
nRBC: 0 % (ref 0.0–0.2)

## 2021-04-25 LAB — GLUCOSE, CAPILLARY: Glucose-Capillary: 147 mg/dL — ABNORMAL HIGH (ref 70–99)

## 2021-04-25 LAB — PROTIME-INR
INR: 2.6 — ABNORMAL HIGH (ref 0.8–1.2)
Prothrombin Time: 27.5 seconds — ABNORMAL HIGH (ref 11.4–15.2)

## 2021-04-25 LAB — RESP PANEL BY RT-PCR (FLU A&B, COVID) ARPGX2
Influenza A by PCR: NEGATIVE
Influenza B by PCR: NEGATIVE
SARS Coronavirus 2 by RT PCR: NEGATIVE

## 2021-04-25 MED ORDER — INSULIN GLARGINE 100 UNIT/ML SOLOSTAR PEN: 10.0000 [IU] | PEN_INJECTOR | Freq: Every morning | SUBCUTANEOUS | Status: DC

## 2021-04-25 MED ORDER — ATENOLOL 25 MG PO TABS
25.0000 mg | ORAL_TABLET | Freq: Every day | ORAL | Status: DC
  Administered 2021-04-26 – 2021-05-02 (×6): 25 mg via ORAL
  Filled 2021-04-25 (×6): qty 1

## 2021-04-25 MED ORDER — METFORMIN HCL 500 MG PO TABS
500.0000 mg | ORAL_TABLET | Freq: Every day | ORAL | Status: DC
  Administered 2021-04-26 – 2021-05-02 (×6): 500 mg via ORAL
  Filled 2021-04-25 (×6): qty 1

## 2021-04-25 MED ORDER — TRAZODONE HCL 50 MG PO TABS: 25.0000 mg | ORAL_TABLET | Freq: Every evening | ORAL | Status: DC | PRN

## 2021-04-25 MED ORDER — FENTANYL CITRATE PF 50 MCG/ML IJ SOSY
25.0000 ug | PREFILLED_SYRINGE | INTRAMUSCULAR | Status: DC | PRN
  Administered 2021-04-25: 25 ug via INTRAVENOUS
  Filled 2021-04-25: qty 1

## 2021-04-25 MED ORDER — FERROUS SULFATE 325 (65 FE) MG PO TABS
325.0000 mg | ORAL_TABLET | ORAL | Status: DC
  Administered 2021-04-26 – 2021-05-02 (×4): 325 mg via ORAL
  Filled 2021-04-25 (×5): qty 1

## 2021-04-25 MED ORDER — LEVOTHYROXINE SODIUM 50 MCG PO TABS
75.0000 ug | ORAL_TABLET | Freq: Every day | ORAL | Status: DC
  Administered 2021-04-28 – 2021-05-02 (×5): 75 ug via ORAL
  Filled 2021-04-25 (×5): qty 1

## 2021-04-25 MED ORDER — POLYETHYLENE GLYCOL 3350 17 GM/SCOOP PO POWD
17.0000 g | Freq: Every day | ORAL | Status: DC | PRN
  Filled 2021-04-25: qty 255

## 2021-04-25 MED ORDER — INSULIN ASPART 100 UNIT/ML IJ SOLN
0.0000 [IU] | Freq: Every day | INTRAMUSCULAR | Status: DC
  Administered 2021-04-27 – 2021-04-30 (×3): 3 [IU] via SUBCUTANEOUS
  Filled 2021-04-25 (×3): qty 1

## 2021-04-25 MED ORDER — MORPHINE SULFATE (PF) 2 MG/ML IV SOLN
0.5000 mg | INTRAVENOUS | Status: DC | PRN
  Administered 2021-04-26 (×4): 0.5 mg via INTRAVENOUS
  Filled 2021-04-25 (×4): qty 1

## 2021-04-25 MED ORDER — SENNOSIDES-DOCUSATE SODIUM 8.6-50 MG PO TABS: 1.0000 | ORAL_TABLET | Freq: Every evening | ORAL | Status: DC | PRN

## 2021-04-25 MED ORDER — INSULIN ASPART 100 UNIT/ML IJ SOLN
0.0000 [IU] | Freq: Three times a day (TID) | INTRAMUSCULAR | Status: DC
  Administered 2021-04-27: 1 [IU] via SUBCUTANEOUS
  Administered 2021-04-27: 5 [IU] via SUBCUTANEOUS
  Administered 2021-04-28: 2 [IU] via SUBCUTANEOUS
  Administered 2021-04-28: 5 [IU] via SUBCUTANEOUS
  Administered 2021-04-28: 3 [IU] via SUBCUTANEOUS
  Administered 2021-04-29 (×3): 2 [IU] via SUBCUTANEOUS
  Administered 2021-04-30: 5 [IU] via SUBCUTANEOUS
  Administered 2021-04-30: 2 [IU] via SUBCUTANEOUS
  Administered 2021-04-30 – 2021-05-01 (×2): 3 [IU] via SUBCUTANEOUS
  Administered 2021-05-01: 1 [IU] via SUBCUTANEOUS
  Administered 2021-05-01: 2 [IU] via SUBCUTANEOUS
  Administered 2021-05-02: 9 [IU] via SUBCUTANEOUS
  Administered 2021-05-02: 2 [IU] via SUBCUTANEOUS
  Filled 2021-04-25 (×16): qty 1

## 2021-04-25 MED ORDER — BISACODYL 5 MG PO TBEC: 5.0000 mg | DELAYED_RELEASE_TABLET | Freq: Every day | ORAL | Status: DC | PRN

## 2021-04-25 MED ORDER — VITAMIN B-12 1000 MCG PO TABS
1000.0000 ug | ORAL_TABLET | ORAL | Status: DC
  Administered 2021-04-26 – 2021-05-01 (×3): 1000 ug via ORAL
  Filled 2021-04-25 (×4): qty 1

## 2021-04-25 MED ORDER — CEFAZOLIN SODIUM-DEXTROSE 2-4 GM/100ML-% IV SOLN
2.0000 g | INTRAVENOUS | Status: DC
  Filled 2021-04-25: qty 100

## 2021-04-25 MED ORDER — HYDROCODONE-ACETAMINOPHEN 5-325 MG PO TABS
1.0000 | ORAL_TABLET | Freq: Four times a day (QID) | ORAL | Status: DC | PRN
  Administered 2021-04-27 – 2021-04-30 (×5): 1 via ORAL
  Administered 2021-04-30: 2 via ORAL
  Administered 2021-05-01 (×2): 1 via ORAL
  Filled 2021-04-25 (×7): qty 1
  Filled 2021-04-25: qty 2

## 2021-04-25 MED ORDER — INSULIN GLARGINE-YFGN 100 UNIT/ML ~~LOC~~ SOLN
10.0000 [IU] | Freq: Every day | SUBCUTANEOUS | Status: DC
  Filled 2021-04-25 (×2): qty 0.1

## 2021-04-25 NOTE — Assessment & Plan Note (Signed)
-   Present on admission's presumed secondary to mechanical fall at clinic - Hip/pelvic imaging was not available for me to visualize - Orthopedic provider was consulted by outpatient provider prior to patient presentation to the emergency department - Extensive discussion with Dr. Joice Lofts, he is aware that patient is on Xarelto for atrial fibrillation - N.p.o. after midnight at orthopedic provider's request

## 2021-04-25 NOTE — Assessment & Plan Note (Addendum)
-   Xarelto was started after CVA in 2014, patient endorses compliance - Xarelto has not been resumed at the time of my admission, a.m. team to resume when appropriate

## 2021-04-25 NOTE — ED Notes (Signed)
Pt placed on purewick 

## 2021-04-25 NOTE — Assessment & Plan Note (Signed)
-   Daily iron supplementation has been resumed

## 2021-04-25 NOTE — ED Provider Notes (Signed)
[facility] Provider Note    Event Date/Time   First MD Initiated Contact with Patient 04/25/21 1718     (approximate)   History   Fall   HPI  [name] is a 87 y.o. female past medical history atrial fibrillation on anticoagulation hypertension hepatitis hypothyroidism prior stroke  Came out of the cardiology clinic with her daughter.  While opening the door she stumbled and fell onto her right side.  This was witnessed by her daughter.  Both the patient and daughter confirm she did not strike her head in any way or fall in her neck she states he struck the wall with the right shoulder and also along her right hip.  She reported significant pain in the right hip.  She had an x-ray at clinic that showed a fracture and it was discussed with orthopedics at the clinic Dr. Binnie Rail PA and patient was transferred for concerns of acute right hip fracture  Past Medical History:  Diagnosis Date   Allergy    Atrial fibrillation (HCC) 2014   presumed as on xarelto after CVA 2014   Bilateral primary osteoarthritis of knee    severe by xrays   Carotid stenosis 10/25/2015   Mild by [place] 2014   Diabetes mellitus without complication (HCC) 1994   Heart disease    Hepatitis    age 85   HLD (hyperlipidemia)    Hypertension    Hypothyroidism    Ischemic stroke (HCC) 2014   R basal ganglion infarct with L hemiparesis 2014, now largely resolved, uses cane   Rheumatic fever    Wears dentures    upper and lower        Physical Exam   Triage Vital Signs: ED Triage Vitals  Enc Vitals Group     BP 04/25/21 1701 111/72     Pulse Rate 04/25/21 1701 (!) 125     Resp 04/25/21 1701 16     Temp 04/25/21 1701 98.3 F (36.8 C)     Temp Source 04/25/21 1701 Oral     SpO2 04/25/21 1701 99 %     Weight 04/25/21 1654 131 lb (59.4 kg)     Height 04/25/21 1654 5\' 1"  (1.549 m)     Head Circumference --      Peak Flow --      Pain Score 04/25/21 1650 0     Pain Loc --       Pain Edu? --      Excl. in GC? --     Most recent vital signs: Vitals:   04/25/21 1701 04/25/21 1836  BP: 111/72 135/84  Pulse: (!) 125 99  Resp: 16 (!) 24  Temp: 98.3 F (36.8 C)   SpO2: 99% 96%   Patient offered Spanish interpreter, but declines.  She wishes her daughter to provide interpretation.  Her daughter is at the bedside very pleasant and able to communicate well back and forth between myself and patient     General: Awake, no distress.  Does report pain in the right hip area but reports it is much better after receiving medication with the paramedics.  Does not wish for any additional medication CV:  Good peripheral perfusion.  Irregular rate, slight tachycardia Resp:  Normal effort.  Clear lung sounds Abd:  No distention.  Other:  Moves all extremities except limitation due to pain in the right hip area.  She does have palpable dorsalis pedis good toe wiggle and normal sensation in  the right lower extremity.  The right lower extremity is slightly shortened and rotated.  Left lower extremity appears to be atraumatic with good range of motion.  Left upper extremity good range of motion atraumatic.  Right upper extremity patient able to range at 3 good range of motion but has a notable approximately fist sized hematoma overlying the proximal to mid anterior humerus.  She has strong palpable radial pulse normal sensation and movement in all fingers of the right hand.  No obvious bony deformity but suspect likely contusion or hematoma formation   ED Results / Procedures / Treatments   Labs (all labs ordered are listed, but only abnormal results are displayed) Labs Reviewed  CBC - Abnormal; Notable for the following components:      Result Value   WBC 13.6 (*)    RBC 3.39 (*)    Hemoglobin 8.4 (*)    HCT 27.7 (*)    MCH 24.8 (*)    All other components within normal limits  BASIC METABOLIC PANEL - Abnormal; Notable for the following components:   Sodium 134 (*)     Chloride 113 (*)    CO2 18 (*)    Calcium 6.5 (*)    Anion gap 3 (*)    All other components within normal limits  PROTIME-INR - Abnormal; Notable for the following components:   Prothrombin Time 27.5 (*)    INR 2.6 (*)    All other components within normal limits  RESP PANEL BY RT-PCR (FLU A&B, COVID) ARPGX2  BASIC METABOLIC PANEL  CBC     EKG  Reviewed inter by me at 1730 Heart rate 110 QRS 89 QTc 440 Atrial fibrillation, slight RVR.  Mild nonspecific T wave abnormality without obvious evidence of ischemia   RADIOLOGY  Chest x-ray and right humerus film personally viewed and interpreted by me.  No evidence of acute fracture or bony lesion.  Chest x-ray negative for acute  Imaging of the hip was not obtained, but orthopedics advises they have access and have already reviewed imaging of the hip from clinic which did notes an acute fracture  PROCEDURES:  Critical Care performed: No  Procedures   MEDICATIONS ORDERED IN ED: Medications  fentaNYL (SUBLIMAZE) injection 25 mcg (25 mcg Intravenous Given 04/25/21 1735)  HYDROcodone-acetaminophen (NORCO/VICODIN) 5-325 MG per tablet 1-2 tablet (has no administration in time range)  morphine (PF) 2 MG/ML injection 0.5 mg (has no administration in time range)  senna-docusate (Senokot-S) tablet 1 tablet (has no administration in time range)  bisacodyl (DULCOLAX) EC tablet 5 mg (has no administration in time range)  ceFAZolin (ANCEF) IVPB 2g/100 mL premix (has no administration in time range)     IMPRESSION / MDM / ASSESSMENT AND PLAN / ED COURSE  I reviewed the triage vital signs and the nursing notes.                              Differential diagnosis includes, but is not limited to, acute hip fracture as denoted by the orthopedics clinic.  I have consulted Dr. Joice Lofts we discussed that she is actively anticoagulated for which she reports he would likely need to wait 30 hours prior to any sort of planned orthopedic surgery he  will see her and evaluate for her hip fracture which she is already reviewed the imaging from independently through the clinic and also regarding hematoma over the right humeral region.  She denies associated chest  pain denies any head injury.  Both she and her daughter report no head injury.  Clinical exam atraumatic no cervical tenderness full range of motion of neck without pain or discomfort.    The patient is on the cardiac monitor to evaluate for evidence of arrhythmia and/or significant heart rate changes.  Known atrial fibrillation.  Anemia appears to be somewhat chronic though slightly worse than her last check  I personally reviewed her imaging of her right humerus, also reviewed her labs which are notable for a moderate leukocytosis anemia and the reduction in CO2 with normal anion gap     FINAL CLINICAL IMPRESSION(S) / ED DIAGNOSES   Final diagnoses:  Closed fracture of hip, unspecified laterality, initial encounter (HCC)  Contusion of right upper extremity, initial encounter     Rx / DC Orders   ED Discharge Orders     None        Note:  This document was prepared using Dragon voice recognition software and may include unintentional dictation errors.   Sharyn Creamer, MD 04/25/21 2008

## 2021-04-25 NOTE — Telephone Encounter (Signed)
Gave patient's daughter [name]) an update regarding the authorization form. MD will be completing clinical portion. [name] stated she was currently in an ambulance wit her mom headed to hospital for surgery, so treatment may be delayed

## 2021-04-25 NOTE — ED Triage Notes (Signed)
Patient to ED from [facility] after falling at her cardiology appointment. Pt c/o right hip and right arm pain. Pt received 50 mcg of fentanyl per EMS. No LOC per family.

## 2021-04-25 NOTE — Hospital Course (Signed)
[name] is a 87 year old female with history of hypothyroid, prior CVA, atrial fibrillation on Xarelto, hypertension, insulin-dependent diabetes mellitus, who presents emergency department at outpatient clinic for chief concerns of a fall witnessed by daughter.  Initial vitals in the emergency department showed temperature of 98.3, respiration rate of 12, heart rate of 102, blood pressure 111/72, SPO2 of 99% on room air.  Serum sodium 134, potassium 3.6, chloride 113, bicarb 18, BUN of 16, serum creatinine of 0.66, nonfasting blood glucose of 80.  WBC 13.6, hemoglobin 8.4, platelets of 218.  ED treatment: Fentanyl 25 mcg.

## 2021-04-25 NOTE — H&P (Signed)
History and Physical   [name] [id number] DOB: 10/31/1934 DOA: 04/25/2021  PCP: Ria Bush, MD  Outpatient Specialists: Dr. Manuella Ghazi, neurology; Dr. Ubaldo Glassing, cardiology Patient coming from: Clinic outpatient  I have personally briefly reviewed patient's old medical records in [place].  Chief Concern: Fall and hip fracture    HPI: Ms. [name] is a 87 year old female with history of hypothyroid, prior CVA, atrial fibrillation on Xarelto, hypertension, insulin-dependent diabetes mellitus, who presents emergency department at outpatient clinic for chief concerns of a fall witnessed by daughter.  Initial vitals in the emergency department showed temperature of 98.3, respiration rate of 12, heart rate of 102, blood pressure 111/72, SPO2 of 99% on room air.  Serum sodium 134, potassium 3.6, chloride 113, bicarb 18, BUN of 16, serum creatinine of 0.66, nonfasting blood glucose of 80.  WBC 13.6, hemoglobin 8.4, platelets of 218.  ED treatment: Fentanyl 25 mcg.  Spanish interpreter used for HPI.  At bedside, she is able to tell me her  name, age, current location of hospital.  She states she was leaving her cardiologist office, her walker locked up and when she was able to free the walker, she tripped and fell.   She reports she took her blood thinner in the AM.  She endorses that the position she is laying is causing her to have neck pain.  She states that her leg feels numb/asleep from the position she is in.  She denies any chest pain, shortness of breath, abdominal pain, dysuria, hematuria. She endorses increased frequency of urination.  She endorses 8 out of 10 pain of the right hip.   Daughter enters the room at this point and dismisses interpreter stating that she has requested that no interpreter be used.  I used interpreter and daughter to let her know that acetaminophen, Percocet, morphine 0.5 mg IV is ordered for her.  She did declines morphine stating  that she does not want to become an addict.  Daughter and patient wants the medication that was used in the emergency department to keep her comfortable.  In the emergency department she was given fentanyl 25 mcg IV.  I explained to patient and daughter that the fentanyl is stronger than 0.5 mg of morphine that I have ordered.  I explained to the family and patient that the goal is to reduce the pain down to 50%.  The goal is not to take the pain away because patient has a broken hip.  Taking the pain down to 0 would increase patient's risk of respiratory distress.  Social history: She lives at home with her daugther. She denies tobacco, etoh, and recreational drug use. 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   Vaccination history: She is vaccinated for covid and influenza.   ROS: Constitutional: no weight change, no fever ENT/Mouth: no sore throat, no rhinorrhea Eyes: no eye pain, no vision changes Cardiovascular: no chest pain, no dyspnea,  no edema, no palpitations Respiratory: no cough, no sputum, no wheezing Gastrointestinal: no nausea, no vomiting, no diarrhea, no constipation Genitourinary: no urinary incontinence, no dysuria, no hematuria Musculoskeletal: no arthralgias, no myalgias Skin: no skin lesions, no pruritus, Neuro: + weakness, no loss of consciousness, no syncope Psych: no anxiety, no depression, + decrease appetite Heme/Lymph: no bruising, no bleeding  ED Course: Discussed with emergency medicine provider, patient requiring hospitalization for chief concerns of hip fracture.  Assessment/Plan  Principal Problem:   Hip fracture (HCC) Active Problems:  Type 2 diabetes mellitus with complication, with long-term current use of insulin (HCC)   Hypothyroidism   Atrial fibrillation (HCC)   Insomnia   Iron deficiency   Anemia   MCI (mild cognitive impairment)   Leukocytosis    Cardiovascular and Mediastinum Atrial fibrillation  (HCC) Assessment & Plan - Xarelto was started after CVA in 2014, patient endorses compliance - Xarelto has not been resumed at the time of my admission, a.m. team to resume when appropriate  Endocrine Hypothyroidism Assessment & Plan - Levothyroxine 75 mcg daily before breakfast resumed  Type 2 diabetes mellitus with complication, with long-term current use of insulin (HCC) Assessment & Plan - Resumed home long-acting insulin, glargine, 10 units daily in the morning; metformin 500 mg daily with breakfast - Insulin SSI with at bedtime coverage ordered - Goal inpatient blood glucose levels 140-180  Musculoskeletal and Integument * Hip fracture (HCC) Assessment & Plan - Present on admission's presumed secondary to mechanical fall at clinic - Hip/pelvic imaging was not available for me to visualize - Orthopedic provider was consulted by outpatient provider prior to patient presentation to the emergency department - Extensive discussion with Dr. Roland Rack, he is aware that patient is on Xarelto for atrial fibrillation - N.p.o. after midnight at orthopedic provider's request  Other Leukocytosis Assessment & Plan - CBC in a.m.  Iron deficiency Assessment & Plan - Daily iron supplementation has been resumed  Chart reviewed.   DVT prophylaxis: SCDs, per patient's and daughter request Code Status: Full code Diet: N.p.o. after midnight Family Communication: Updated daughter at bedside Disposition Plan: Pending clinical course Consults called: Orthopedic Admission status: Telemetry medical, inpatient  Past Medical History:  Diagnosis Date   Allergy    Atrial fibrillation ([place]) 2014   presumed as on xarelto after CVA 2014   Bilateral primary osteoarthritis of knee    severe by xrays   Carotid stenosis 10/25/2015   Mild by [place] 2014   Diabetes mellitus without complication ([place]) Q000111Q   Heart disease    Hepatitis    age 72   HLD (hyperlipidemia)    Hypertension    Hypothyroidism     Ischemic stroke ([place]) 2014   R basal ganglion infarct with L hemiparesis 2014, now largely resolved, uses cane   Rheumatic fever    Wears dentures    upper and lower   Past Surgical History:  Procedure Laterality Date   CATARACT EXTRACTION W/PHACO Right 12/25/2015   Procedure: CATARACT EXTRACTION PHACO AND INTRAOCULAR LENS PLACEMENT ([place]);  Surgeon: Eulogio Bear, MD;  Location: [place];  Service: Ophthalmology;  Laterality: Right;  DIABETIC NEEDS INTERPRETER RIGHT   COLONOSCOPY  2013   per patient WNL ([place])   TOTAL ABDOMINAL HYSTERECTOMY W/ BILATERAL SALPINGOOPHORECTOMY  1972   Social History:  reports that she has never smoked. She has never used smokeless tobacco. She reports that she does not drink alcohol and does not use drugs.  Allergies  Allergen Reactions   Chloramphenicols Swelling    Moderate facial swelling   Influenza Vaccines Other (See Comments)    Bad reaction   No family history on file. Family history: Family history reviewed and not pertinent.  Prior to Admission medications   Medication Sig Start Date End Date Taking? Authorizing Provider  atenolol (TENORMIN) 25 MG tablet TAKE 1 TABLET (25 MG TOTAL) BY MOUTH DAILY. 03/17/21  Yes Ria Bush, MD  BD PEN NEEDLE NANO 2ND GEN 32G X 4 MM MISC USE TO ADMINISTER INSULIN AS DIRECTED  04/21/20  Yes Ria Bush, MD  Bioflavonoid Products [facility]) TABS Take 1 tablet by mouth daily.   Yes [provider]  insulin glargine (LANTUS SOLOSTAR) 100 UNIT/ML Solostar Pen Inject 8 Units into the skin daily as needed. Patient taking differently: Inject 10 Units into the skin in the morning. 09/19/19  Yes Ria Bush, MD  levothyroxine (SYNTHROID) 75 MCG tablet TAKE 1 TABLET BY MOUTH DAILY BEFORE BREAKFAST. 03/05/21  Yes Ria Bush, MD  metFORMIN (GLUCOPHAGE) 500 MG tablet Take 1 tablet (500 mg total) by mouth 2 (two) times daily with a meal. Patient taking differently: Take 500 mg  by mouth daily with breakfast. 04/13/21  Yes Ria Bush, MD  polyethylene glycol powder (GLYCOLAX/MIRALAX) 17 GM/SCOOP powder Take 17 g by mouth daily as needed for moderate constipation. 04/20/20  Yes Ria Bush, MD  rivaroxaban (XARELTO) 20 MG TABS tablet Take 1 tablet (20 mg total) by mouth daily with supper. Patient taking differently: Take 20 mg by mouth in the morning. 04/20/20  Yes Ria Bush, MD  traZODone (DESYREL) 50 MG tablet Take 0.5-1 tablets (25-50 mg total) by mouth at bedtime. Patient taking differently: Take 25-50 mg by mouth at bedtime as needed for sleep. 04/12/21  Yes Ria Bush, MD  Iron, Ferrous Sulfate, 325 (65 Fe) MG TABS Take 325 mg by mouth every other day. Patient not taking: Reported on 04/25/2021 04/13/21   Ria Bush, MD  vitamin B-12 (CYANOCOBALAMIN) 1000 MCG tablet Take 1 tablet (1,000 mcg total) by mouth every Monday, Wednesday, and Friday. Patient not taking: Reported on 04/25/2021 04/15/21   Ria Bush, MD   Physical Exam: Vitals:   04/25/21 1654 04/25/21 1701 04/25/21 1836 04/25/21 2141  BP:  111/72 135/84 106/77  Pulse:  (!) 125 99 98  Resp:  16 (!) 24 20  Temp:  98.3 F (36.8 C)  98.3 F (36.8 C)  TempSrc:  Oral    SpO2:  99% 96% 100%  Weight: 59.4 kg     Height: 5\' 1"  (1.549 m)      Constitutional: appears age-appropriate, NAD, calm, comfortable Eyes: PERRL, lids and conjunctivae normal ENMT: Mucous membranes are moist. Posterior pharynx clear of any exudate or lesions. Age-appropriate dentition. Hearing appropriate Neck: normal, supple, no masses, no thyromegaly Respiratory: clear to auscultation bilaterally, no wheezing, no crackles. Normal respiratory effort. No accessory muscle use.  Cardiovascular: Regular rate and rhythm, no murmurs / rubs / gallops. No extremity edema. 2+ pedal pulses. No carotid bruits.  Abdomen: no tenderness, no masses palpated, no hepatosplenomegaly. Bowel sounds positive.   Musculoskeletal: no clubbing / cyanosis. No joint deformity upper and lower extremities. Good ROM, no contractures, no atrophy. Normal muscle tone.  Right hip pain. Skin: no rashes, lesions, ulcers. No induration Neurologic: Sensation intact. Strength 5/5 in all 4.  Psychiatric: Normal judgment and insight. Alert and oriented x 3. Normal mood.   EKG: independently reviewed, showing atrial fibrillation with rate of 107, QTc 446  Chest x-ray on Admission: I personally reviewed and I agree with radiologist reading as below.  DG Chest Port 1 View  Result Date: 04/25/2021 CLINICAL DATA:  Fall EXAM: PORTABLE CHEST 1 VIEW COMPARISON:  None. FINDINGS: The heart size and mediastinal contours are within normal limits. Both lungs are clear. The visualized skeletal structures are unremarkable. IMPRESSION: No active disease. Electronically Signed   By: Franchot Gallo M.D.   On: 04/25/2021 17:57   DG Humerus Right  Result Date: 04/25/2021 CLINICAL DATA:  Fall.  Right shoulder swelling  EXAM: RIGHT HUMERUS - 2+ VIEW COMPARISON:  None. FINDINGS: There is no evidence of fracture or other focal bone lesions. Soft tissues are unremarkable. IMPRESSION: Negative. Electronically Signed   By: Franchot Gallo M.D.   On: 04/25/2021 17:57    Labs on Admission: I have personally reviewed following labs  CBC: Recent Labs  Lab 04/25/21 1702  WBC 13.6*  HGB 8.4*  HCT 27.7*  MCV 81.7  PLT 99991111   Basic Metabolic Panel: Recent Labs  Lab 04/25/21 1702  NA 134*  K 3.6  CL 113*  CO2 18*  GLUCOSE 80  BUN 16  CREATININE 0.66  CALCIUM 6.5*   GFR: Estimated Creatinine Clearance: 43.3 mL/min (by C-G formula based on SCr of 0.66 mg/dL).  Coagulation Profile: Recent Labs  Lab 04/25/21 1702  INR 2.6*   Urine analysis:    Component Value Date/Time   BILIRUBINUR neg 04/20/2020 1655   PROTEINUR Negative 04/20/2020 1655   UROBILINOGEN 0.2 04/20/2020 1655   NITRITE neg 04/20/2020 1655   LEUKOCYTESUR Small  (1+) (A) 04/20/2020 1655   Dr. Tobie Poet [facility]  If 7PM-7AM, please contact overnight-coverage provider If 7AM-7PM, please contact day coverage provider [email or web address]  04/26/2021, 12:52 AM

## 2021-04-25 NOTE — Assessment & Plan Note (Signed)
-   Resumed home long-acting insulin, glargine, 10 units daily in the morning; metformin 500 mg daily with breakfast - Insulin SSI with at bedtime coverage ordered - Goal inpatient blood glucose levels 140-180

## 2021-04-25 NOTE — Telephone Encounter (Signed)
Medicare Form for Visco Supplementation Injectable Medication Pre Certification completed and faxed to [phone].

## 2021-04-26 ENCOUNTER — Encounter: Payer: Self-pay | Admitting: Family Medicine

## 2021-04-26 DIAGNOSIS — E039 Hypothyroidism, unspecified: Secondary | ICD-10-CM | POA: Diagnosis not present

## 2021-04-26 DIAGNOSIS — D72829 Elevated white blood cell count, unspecified: Secondary | ICD-10-CM | POA: Diagnosis present

## 2021-04-26 DIAGNOSIS — S72001A Fracture of unspecified part of neck of right femur, initial encounter for closed fracture: Secondary | ICD-10-CM | POA: Diagnosis not present

## 2021-04-26 DIAGNOSIS — D649 Anemia, unspecified: Secondary | ICD-10-CM | POA: Diagnosis not present

## 2021-04-26 DIAGNOSIS — I480 Paroxysmal atrial fibrillation: Secondary | ICD-10-CM | POA: Diagnosis not present

## 2021-04-26 LAB — CBC
HCT: 24.7 % — ABNORMAL LOW (ref 36.0–46.0)
Hemoglobin: 7.7 g/dL — ABNORMAL LOW (ref 12.0–15.0)
MCH: 24.9 pg — ABNORMAL LOW (ref 26.0–34.0)
MCHC: 31.2 g/dL (ref 30.0–36.0)
MCV: 79.9 fL — ABNORMAL LOW (ref 80.0–100.0)
Platelets: 192 10*3/uL (ref 150–400)
RBC: 3.09 MIL/uL — ABNORMAL LOW (ref 3.87–5.11)
RDW: 14.1 % (ref 11.5–15.5)
WBC: 10.1 10*3/uL (ref 4.0–10.5)
nRBC: 0 % (ref 0.0–0.2)

## 2021-04-26 LAB — RETICULOCYTES
Immature Retic Fract: 16.2 % — ABNORMAL HIGH (ref 2.3–15.9)
RBC.: 3.07 MIL/uL — ABNORMAL LOW (ref 3.87–5.11)
Retic Count, Absolute: 45.7 10*3/uL (ref 19.0–186.0)
Retic Ct Pct: 1.5 % (ref 0.4–3.1)

## 2021-04-26 LAB — IRON AND TIBC
Iron: 25 ug/dL — ABNORMAL LOW (ref 28–170)
Saturation Ratios: 7 % — ABNORMAL LOW (ref 10.4–31.8)
TIBC: 384 ug/dL (ref 250–450)
UIBC: 359 ug/dL

## 2021-04-26 LAB — BASIC METABOLIC PANEL
Anion gap: 10 (ref 5–15)
BUN: 25 mg/dL — ABNORMAL HIGH (ref 8–23)
CO2: 24 mmol/L (ref 22–32)
Calcium: 8.5 mg/dL — ABNORMAL LOW (ref 8.9–10.3)
Chloride: 99 mmol/L (ref 98–111)
Creatinine, Ser: 0.85 mg/dL (ref 0.44–1.00)
GFR, Estimated: >60 mL/min (ref >60)
Glucose, Bld: 138 mg/dL — ABNORMAL HIGH (ref 70–99)
Potassium: 4.2 mmol/L (ref 3.5–5.1)
Sodium: 133 mmol/L — ABNORMAL LOW (ref 135–145)

## 2021-04-26 LAB — PROTIME-INR
INR: 1.6 — ABNORMAL HIGH (ref 0.8–1.2)
Prothrombin Time: 19.2 seconds — ABNORMAL HIGH (ref 11.4–15.2)

## 2021-04-26 LAB — VITAMIN B12: Vitamin B-12: 4003 pg/mL — ABNORMAL HIGH (ref 180–914)

## 2021-04-26 LAB — FERRITIN: Ferritin: 7 ng/mL — ABNORMAL LOW (ref 11–307)

## 2021-04-26 LAB — GLUCOSE, CAPILLARY
Glucose-Capillary: 214 mg/dL — ABNORMAL HIGH (ref 70–99)
Glucose-Capillary: 225 mg/dL — ABNORMAL HIGH (ref 70–99)
Glucose-Capillary: 247 mg/dL — ABNORMAL HIGH (ref 70–99)
Glucose-Capillary: 191 mg/dL — ABNORMAL HIGH (ref 70–99)

## 2021-04-26 LAB — VITAMIN D 25 HYDROXY (VIT D DEFICIENCY, FRACTURES): Vit D, 25-Hydroxy: 65.72 ng/mL (ref 30–100)

## 2021-04-26 LAB — FOLATE: Folate: 36 ng/mL (ref >5.9)

## 2021-04-26 MED ORDER — FENTANYL CITRATE PF 50 MCG/ML IJ SOSY: 12.5000 ug | PREFILLED_SYRINGE | INTRAMUSCULAR | Status: DC | PRN

## 2021-04-26 MED ORDER — SODIUM CHLORIDE 0.9 % IV SOLN: INTRAVENOUS | Status: DC

## 2021-04-26 MED ORDER — GLUCERNA SHAKE PO LIQD
237.0000 mL | Freq: Two times a day (BID) | ORAL | Status: DC
Start: 2021-04-27 — End: 2021-05-02
  Administered 2021-04-27 – 2021-05-02 (×7): 237 mL via ORAL

## 2021-04-26 MED ORDER — ENSURE MAX PROTEIN PO LIQD
11.0000 [oz_av] | Freq: Every day | ORAL | Status: DC
Start: 2021-04-26 — End: 2021-05-02
  Administered 2021-04-26 – 2021-04-30 (×4): 11 [oz_av] via ORAL
  Filled 2021-04-26: qty 330

## 2021-04-26 MED ORDER — CEFAZOLIN SODIUM-DEXTROSE 2-4 GM/100ML-% IV SOLN
2.0000 g | INTRAVENOUS | Status: AC
  Administered 2021-04-27: 2 g via INTRAVENOUS

## 2021-04-26 MED ORDER — INSULIN GLARGINE 100 UNIT/ML ~~LOC~~ SOLN
10.0000 [IU] | Freq: Every day | SUBCUTANEOUS | Status: DC
  Filled 2021-04-26 (×2): qty 0.1

## 2021-04-26 MED ORDER — INSULIN GLARGINE 100 UNIT/ML ~~LOC~~ SOLN
10.0000 [IU] | Freq: Every day | SUBCUTANEOUS | Status: DC
  Administered 2021-04-28 – 2021-05-02 (×5): 10 [IU] via SUBCUTANEOUS
  Filled 2021-04-26 (×7): qty 0.1

## 2021-04-26 MED ORDER — ADULT MULTIVITAMIN W/MINERALS CH
1.0000 | ORAL_TABLET | Freq: Every day | ORAL | Status: DC
  Administered 2021-04-28 – 2021-05-02 (×5): 1 via ORAL
  Filled 2021-04-26 (×6): qty 1

## 2021-04-26 MED ORDER — NON FORMULARY: 10.0000 [IU] | Freq: Every day | Status: DC

## 2021-04-26 NOTE — Telephone Encounter (Signed)
Pt daughter called wanting an call back about the injection, she stated that her mother fell and broke her hip and is wanting to have the injection done in the hospital by Dr. Patsy Lager, after she has surgery

## 2021-04-26 NOTE — Consult Note (Signed)
ORTHOPAEDIC CONSULTATION  REQUESTING PHYSICIAN: Nalleli Largent, Marshall Cork, MD  Chief Complaint:   Right hip pain.  History of Present Illness: [name] is a 87 y.o. female with multiple medical problems including chronic atrial fibrillation requiring anticoagulation with Xarelto, significant degenerative joint disease of both knees, carotid stenosis, diabetes, coronary artery disease, hypertension, hypothyroidism, and hyperlipidemia who lives with her daughter.  The patient apparently had gone to her cardiology appointment yesterday afternoon.  As she was leaving the office, she lost her balance and fell onto her right hip.  X-rays in the clinic demonstrated a varus impacted intertrochanteric fracture of the right hip.  The patient was transferred to [facility] and admitted in preparation for definitive management of this injury.  The patient denies any associated injuries.  She did not strike her head or lose consciousness.  The patient also denies any lightheadedness, dizziness, chest pain, shortness of breath, or other symptoms which may have precipitated her fall.  However, the patient does have history of significant degenerative joint disease of both knees which tend to be quite problematic.  Therefore, the daughter postulates that her knee may have buckled due to pain, causing her to fall.  Past Medical History:  Diagnosis Date   Allergy    Atrial fibrillation [facility]) 2014   presumed as on xarelto after CVA 2014   Bilateral primary osteoarthritis of knee    severe by xrays   Carotid stenosis 10/25/2015   Mild by [place] 2014   Diabetes mellitus without complication ([place]) [id number]   Heart disease    Hepatitis    age 82   HLD (hyperlipidemia)    Hypertension    Hypothyroidism    Ischemic stroke ([place]) 2014   R basal ganglion infarct with L hemiparesis 2014, now largely resolved, uses cane   Rheumatic fever    Wears dentures    upper and lower    Past Surgical History:  Procedure Laterality Date   CATARACT EXTRACTION W/PHACO Right 12/25/2015   Procedure: CATARACT EXTRACTION PHACO AND INTRAOCULAR LENS PLACEMENT ([place]);  Surgeon: Eulogio Bear, MD;  Location: [place];  Service: Ophthalmology;  Laterality: Right;  DIABETIC NEEDS INTERPRETER RIGHT   COLONOSCOPY  2013   per patient WNL ([place])   TOTAL ABDOMINAL HYSTERECTOMY W/ BILATERAL SALPINGOOPHORECTOMY  1972   Social History   Socioeconomic History   Marital status: Widowed    Spouse name: Not on file   Number of children: Not on file   Years of education: Not on file   Highest education level: Not on file  Occupational History   Not on file  Tobacco Use   Smoking status: Never   Smokeless tobacco: Never  Vaping Use   Vaping Use: Never used  Substance and Sexual Activity   Alcohol use: No   Drug use: No   Sexual activity: Not on file  Other Topics Concern   Not on file  Social History Narrative   Lives with daughter   Clois Comber    Originally from [place], moved [place]   G5P5   Activity: stationary bicycle 30 min /day   Diet: good water, fruits/vegetables daily   Social Determinants of Health   Financial Resource Strain: Not on file  Food Insecurity: Not on file  Transportation Needs: Not on file  Physical Activity: Not on file  Stress: Not on file  Social Connections: Not on file   No family history on file. Allergies  Allergen Reactions   Chloramphenicols Swelling    Moderate  facial swelling   Influenza Vaccines Other (See Comments)    Bad reaction   Prior to Admission medications   Medication Sig Start Date End Date Taking? Authorizing Provider  atenolol (TENORMIN) 25 MG tablet TAKE 1 TABLET (25 MG TOTAL) BY MOUTH DAILY. 03/17/21   Ria Bush, MD  BD PEN NEEDLE NANO 2ND GEN 32G X 4 MM MISC USE TO ADMINISTER INSULIN AS DIRECTED 04/21/20   Ria Bush, MD  Bioflavonoid Products [facility]) TABS Take 1 tablet by mouth  daily.    [provider]  insulin glargine (LANTUS SOLOSTAR) 100 UNIT/ML Solostar Pen Inject 8 Units into the skin daily as needed. Patient taking differently: Inject 10 Units into the skin in the morning. 09/19/19   Ria Bush, MD  Iron, Ferrous Sulfate, 325 (65 Fe) MG TABS Take 325 mg by mouth every other day. Patient not taking: Reported on 04/25/2021 04/13/21   Ria Bush, MD  levothyroxine (SYNTHROID) 75 MCG tablet TAKE 1 TABLET BY MOUTH DAILY BEFORE BREAKFAST. 03/05/21   Ria Bush, MD  metFORMIN (GLUCOPHAGE) 500 MG tablet Take 1 tablet (500 mg total) by mouth 2 (two) times daily with a meal. Patient taking differently: Take 500 mg by mouth daily with breakfast. 04/13/21   Ria Bush, MD  polyethylene glycol powder (GLYCOLAX/MIRALAX) 17 GM/SCOOP powder Take 17 g by mouth daily as needed for moderate constipation. 04/20/20   Ria Bush, MD  rivaroxaban (XARELTO) 20 MG TABS tablet Take 1 tablet (20 mg total) by mouth daily with supper. Patient taking differently: Take 20 mg by mouth in the morning. 04/20/20   Ria Bush, MD  traZODone (DESYREL) 50 MG tablet Take 0.5-1 tablets (25-50 mg total) by mouth at bedtime. Patient taking differently: Take 25-50 mg by mouth at bedtime as needed for sleep. 04/12/21   Ria Bush, MD  vitamin B-12 (CYANOCOBALAMIN) 1000 MCG tablet Take 1 tablet (1,000 mcg total) by mouth every Monday, Wednesday, and Friday. Patient not taking: Reported on 04/25/2021 04/15/21   Ria Bush, MD   DG Chest Port 1 View  Result Date: 04/25/2021 CLINICAL DATA:  Fall EXAM: PORTABLE CHEST 1 VIEW COMPARISON:  None. FINDINGS: The heart size and mediastinal contours are within normal limits. Both lungs are clear. The visualized skeletal structures are unremarkable. IMPRESSION: No active disease. Electronically Signed   By: Franchot Gallo M.D.   On: 04/25/2021 17:57   DG Humerus Right  Result Date: 04/25/2021 CLINICAL DATA:  Fall.   Right shoulder swelling EXAM: RIGHT HUMERUS - 2+ VIEW COMPARISON:  None. FINDINGS: There is no evidence of fracture or other focal bone lesions. Soft tissues are unremarkable. IMPRESSION: Negative. Electronically Signed   By: Franchot Gallo M.D.   On: 04/25/2021 17:57    Positive ROS: All other systems have been reviewed and were otherwise negative with the exception of those mentioned in the HPI and as above.  Physical Exam: General:  Alert, no acute distress Psychiatric:  Patient is competent for consent with normal mood and affect   Cardiovascular:  No pedal edema Respiratory:  No wheezing, non-labored breathing GI:  Abdomen is soft and non-tender Skin:  No lesions in the area of chief complaint Neurologic:  Sensation intact distally Lymphatic:  No axillary or cervical lymphadenopathy  Orthopedic Exam:  Orthopedic examination is limited to the right hip and lower extremity.  The right lower extremity is somewhat shortened and externally rotated as compared to the left.  Skin inspection around the right hip is notable for mild swelling and  early ecchymosis, but otherwise is unremarkable.  No erythema, abrasions, or other skin abnormalities are identified.  She has mild-moderate tenderness to palpation over the lateral aspect of the right hip.  She has more severe pain with any attempted active or passive motion of the hip.  She is grossly neurovascularly intact to the right lower extremity and foot.  X-rays:  X-rays of the pelvis and right hip are available for review and have been reviewed by myself.  These films demonstrate a varus impacted/displaced intertrochanteric fracture of the right hip.  No significant degenerative changes of the hip joint are noted.  No lytic lesions or other acute bony abnormalities are identified.  Assessment: Displaced intertrochanteric fracture right hip.  Plan: The treatment options, including both surgical and nonsurgical choices, have been discussed in  detail with the patient and her daughter who is at the bedside.  Both the patient and her daughter would like to proceed with surgical intervention to include an intramedullary nailing of the displaced intertrochanteric fracture of her right hip. The risks (including bleeding, infection, nerve and/or blood vessel injury, persistent or recurrent pain, loosening or failure of the components, leg length inequality, dislocation, need for further surgery, blood clots, strokes, heart attacks or arrhythmias, pneumonia, etc.) and benefits of the surgical procedure were discussed. The patient and her daughter state their understanding and agree to proceed.  She agrees to a blood transfusion if necessary. A formal written consent will be obtained by the nursing staff.  I will put off doing the surgery until tomorrow as her PT/INR levels are still elevated at 1.6/19.2 and her H/H is low at 7.7/24.7.  The patient is on Xarelto and her last dose was yesterday morning, so waiting until tomorrow morning is certainly reasonable to allow this medication to get out of her system.  This also gives [place] the opportunity to optimize her from a medical standpoint.  This may require a transfusion to better optimize her hemoglobin and hematocrit.  Thank you for asking me to participate in the care of this most pleasant yet unfortunate woman.  I will be happy to follow her with you.   Pascal Lux, MD  Beeper #:  [phone]  04/26/2021 8:08 AM

## 2021-04-26 NOTE — Assessment & Plan Note (Signed)
Seems stable and at baseline. -Continue to monitor 

## 2021-04-26 NOTE — Progress Notes (Signed)
PROGRESS NOTE    [name]  XVQ:[id number] DOB: [birth year] DOA: 04/25/2021 PCP: Eustaquio Boyden, MD    Brief Narrative:  [name] is a 87 year old female with past medical history significant for paroxysmal atrial fibrillation on Xarelto, hypothyroidism, essential hypertension, type 2 diabetes mellitus, Hx CVA who presented to [facility] ED on 2/23 following witnessed fall.  Patient was leaving her cardiologist office and her walker locked up causing her to trip and fall.  Patient with significant pain of her right hip.  In the ED, temperature 98.3 F, HR 125, RR 16, BP 111/72, SPO2 99% on room air.  Sodium 134, potassium 3.6, chloride 113, CO2 18, glucose 80, BUN 16, creatinine 0.66.  WBC 13.6, hemoglobin 8.4, platelet 218, INR 2.6.  COVID-19 PCR negative.  Influenza A/B PCR negative.  Chest x-ray with no acute cardiopulmonary disease process.  Right humerus x-ray negative.  Hip x-ray was performed in outpatient clinic that demonstrated a varus impacted/displaced intratrochanteric fracture of the right hip.  Orthopedics was consulted.  TRH consulted for further evaluation and management of hip fracture.   Assessment & Plan:     Right hip fracture, POA Patient presenting to the ED following mechanical fall with findings of right intertrochanteric hip fracture. --Orthopedics following, appreciate assistance --Norco 1-2 tablets every 6 hours PRN moderate pain --Morphine 0.5 mg IV q2h PRN severe pain --Fentanyl IV PRN for severe pain not controlled with IV morphine --Plan for operative management tomorrow, 04/27/2021, n.p.o. after midnight  Hypertension Paroxysmal atrial fibrillation --Atenolol 25 mg p.o. daily --Holding Xarelto for anticipated surgery tomorrow  Type 2 diabetes mellitus --Semglee 10u [place] daily (daughter reports side effect to any other insulin except lantus; and will bring in home medication for use. --Metformin 500 mg p.o. daily --SSI for coverage --CBGs  qAC/HS  Anemia --hgb 8.4>7.7 --Check anemia panel --Ferrous sulfate 325 mg p.o. every other day --Vitamin B12 1000 mcg Monday/Wednesday/Friday --Repeat CBC in a.m.  Hypothyroidism --Levothyroxine 75 mcg p.o. daily   DVT prophylaxis: Place TED hose Start: 04/25/21 1857    Code Status: Full Code Family Communication: No family present at bedside, attempted to call patient's daughter while in the room with patient's phone, no answer.  Disposition Plan:  Level of care: Telemetry Medical Status is: Inpatient Remains inpatient appropriate because: Pending surgical intervention for right hip fracture, may need SNF placement, therapy evaluation postoperatively   Consultants:  Orthopedics, Dr. Joice Lofts  Procedures:  none  Antimicrobials:  none   Subjective: Patient seen examined bedside, resting comfortably.  No family present.  Aided with Spanish video interpreter.  No specific complaints this morning, pain currently well controlled.  Pending surgical invention, likely tomorrow.  Per RN, daughter concerned about side effect of patient taking any other insulin besides her Lantus and currently refusing NovoLog and Semglee; will bring in home Lantus.  Patient denies headache, no chest pain, no shortness of breath, no abdominal pain.  No acute events overnight per nursing staff.  Objective: Vitals:   04/25/21 1836 04/25/21 2141 04/26/21 0539 04/26/21 0759  BP: 135/84 106/77 110/84 115/69  Pulse: 99 98 (!) 110 100  Resp: (!) 24 20 20 16   Temp:  98.3 F (36.8 C) 98.6 F (37 C) 98.3 F (36.8 C)  TempSrc:      SpO2: 96% 100% 100% 96%  Weight:      Height:       No intake or output data in the 24 hours ending 04/26/21 1126 Filed Weights   04/25/21 1654  Weight: 59.4 kg    Examination:  Physical Exam: GEN: NAD, alert and oriented x 3, wd/wn HEENT: NCAT, PERRL, EOMI, sclera clear, MMM PULM: CTAB w/o wheezes/crackles, normal respiratory effort, on room air CV: RRR w/o  M/G/R GI: abd soft, NTND, NABS, no R/G/M MSK: no peripheral edema, right lower extremity shortened, externally rotated, neurovascular intact, otherwise moves all other extremities independently with preserved strength NEURO: CN II-XII intact, no focal deficits, sensation to light touch intact PSYCH: normal mood/affect Integumentary: dry/intact, no rashes or wounds    Data Reviewed: I have personally reviewed following labs and imaging studies  CBC: Recent Labs  Lab 04/25/21 1702 04/26/21 0524  WBC 13.6* 10.1  HGB 8.4* 7.7*  HCT 27.7* 24.7*  MCV 81.7 79.9*  PLT 218 192   Basic Metabolic Panel: Recent Labs  Lab 04/25/21 1702 04/26/21 0524  NA 134* 133*  K 3.6 4.2  CL 113* 99  CO2 18* 24  GLUCOSE 80 138*  BUN 16 25*  CREATININE 0.66 0.85  CALCIUM 6.5* 8.5*   GFR: Estimated Creatinine Clearance: 40.8 mL/min (by C-G formula based on SCr of 0.85 mg/dL). Liver Function Tests: No results for input(s): AST, ALT, ALKPHOS, BILITOT, PROT, ALBUMIN in the last 168 hours. No results for input(s): LIPASE, AMYLASE in the last 168 hours. No results for input(s): AMMONIA in the last 168 hours. Coagulation Profile: Recent Labs  Lab 04/25/21 1702 04/26/21 0524  INR 2.6* 1.6*   Cardiac Enzymes: No results for input(s): CKTOTAL, CKMB, CKMBINDEX, TROPONINI in the last 168 hours. BNP (last 3 results) No results for input(s): PROBNP in the last 8760 hours. HbA1C: No results for input(s): HGBA1C in the last 72 hours. CBG: Recent Labs  Lab 04/25/21 2215 04/26/21 0945  GLUCAP 147* 214*   Lipid Profile: No results for input(s): CHOL, HDL, LDLCALC, TRIG, CHOLHDL, LDLDIRECT in the last 72 hours. Thyroid Function Tests: No results for input(s): TSH, T4TOTAL, FREET4, T3FREE, THYROIDAB in the last 72 hours. Anemia Panel: No results for input(s): VITAMINB12, FOLATE, FERRITIN, TIBC, IRON, RETICCTPCT in the last 72 hours. Sepsis Labs: No results for input(s): PROCALCITON, LATICACIDVEN  in the last 168 hours.  Recent Results (from the past 240 hour(s))  Resp Panel by RT-PCR (Flu A&B, Covid) Nasopharyngeal Swab     Status: None   Collection Time: 04/25/21  5:02 PM   Specimen: Nasopharyngeal Swab; Nasopharyngeal(NP) swabs in vial transport medium  Result Value Ref Range Status   SARS Coronavirus 2 by RT PCR NEGATIVE NEGATIVE Final    Comment: (NOTE) SARS-CoV-2 target nucleic acids are NOT DETECTED.  The SARS-CoV-2 RNA is generally detectable in upper respiratory specimens during the acute phase of infection. The lowest concentration of SARS-CoV-2 viral copies this assay can detect is 138 copies/mL. A negative result does not preclude SARS-Cov-2 infection and should not be used as the sole basis for treatment or other patient management decisions. A negative result may occur with  improper specimen collection/handling, submission of specimen other than nasopharyngeal swab, presence of viral mutation(s) within the areas targeted by this assay, and inadequate number of viral copies(<138 copies/mL). A negative result must be combined with clinical observations, patient history, and epidemiological information. The expected result is Negative.  Fact Sheet for Patients:  BloggerCourse.com  Fact Sheet for Healthcare Providers:  SeriousBroker.it  This test is no t yet approved or cleared by the [place] FDA and  has been authorized for detection and/or diagnosis of SARS-CoV-2 by FDA under an Emergency Use Authorization (EUA).  This EUA will remain  in effect (meaning this test can be used) for the duration of the COVID-19 declaration under Section 564(b)(1) of the Act, 21 U.S.C.section 360bbb-3(b)(1), unless the authorization is terminated  or revoked sooner.       Influenza A by PCR NEGATIVE NEGATIVE Final   Influenza B by PCR NEGATIVE NEGATIVE Final    Comment: (NOTE) The Xpert Xpress SARS-CoV-2/FLU/RSV plus  assay is intended as an aid in the diagnosis of influenza from Nasopharyngeal swab specimens and should not be used as a sole basis for treatment. Nasal washings and aspirates are unacceptable for Xpert Xpress SARS-CoV-2/FLU/RSV testing.  Fact Sheet for Patients: BloggerCourse.com  Fact Sheet for Healthcare Providers: SeriousBroker.it  This test is not yet approved or cleared by the [place] FDA and has been authorized for detection and/or diagnosis of SARS-CoV-2 by FDA under an Emergency Use Authorization (EUA). This EUA will remain in effect (meaning this test can be used) for the duration of the COVID-19 declaration under Section 564(b)(1) of the Act, 21 U.S.C. section 360bbb-3(b)(1), unless the authorization is terminated or revoked.  Performed at [facility], [address], [place], [place] [address]          Radiology Studies: [facility] Chest [place] 1 View  Result Date: 04/25/2021 CLINICAL DATA:  Fall EXAM: PORTABLE CHEST 1 VIEW COMPARISON:  None. FINDINGS: The heart size and mediastinal contours are within normal limits. Both lungs are clear. The visualized skeletal structures are unremarkable. IMPRESSION: No active disease. Electronically Signed   By: Marlan Palau M.D.   On: 04/25/2021 17:57   DG Humerus Right  Result Date: 04/25/2021 CLINICAL DATA:  Fall.  Right shoulder swelling EXAM: RIGHT HUMERUS - 2+ VIEW COMPARISON:  None. FINDINGS: There is no evidence of fracture or other focal bone lesions. Soft tissues are unremarkable. IMPRESSION: Negative. Electronically Signed   By: Marlan Palau M.D.   On: 04/25/2021 17:57        Scheduled Meds:  atenolol  25 mg Oral Daily   ferrous sulfate  325 mg Oral QODAY   insulin aspart  0-5 Units Subcutaneous QHS   insulin aspart  0-9 Units Subcutaneous TID WC   insulin glargine-yfgn  10 Units Subcutaneous Daily   levothyroxine  75 mcg Oral QAC breakfast    metFORMIN  500 mg Oral Q breakfast   vitamin B-12  1,000 mcg Oral Q M,W,F   Continuous Infusions:  [START ON 04/27/2021]  ceFAZolin (ANCEF) IV       LOS: 1 day    Time spent: 48 minutes spent on chart review, discussion with nursing staff, consultants, updating family and interview/physical exam; more than 50% of that time was spent in counseling and/or coordination of care.    Alvira Philips [place], DO [facility] Available via Epic secure chat 7am-7pm After these hours, please refer to coverage provider listed on [email or web address] 04/26/2021, 11:26 AM

## 2021-04-26 NOTE — Assessment & Plan Note (Signed)
-   Levothyroxine 75 mcg daily before breakfast resumed 

## 2021-04-26 NOTE — Progress Notes (Signed)
Patient refusing Novolog and Semglee insulins, stating that they give her a reaction. Only wants to be on her home regimen of Lantus and metformin. MD [place] aware.

## 2021-04-26 NOTE — Telephone Encounter (Signed)
I spoke with Lupita Leash CMA and she advised I send this note to [facility] RN Admin for office.  Please see pt message on 04/26/21.

## 2021-04-26 NOTE — Progress Notes (Signed)
Patient brought in Lantus and administered 10 units. MD [place] aware and updated orders in [facility].

## 2021-04-26 NOTE — Progress Notes (Signed)
Initial Nutrition Assessment  DOCUMENTATION CODES:   Not applicable  INTERVENTION:   -Glucerna Shake po BID, each supplement provides 220 kcal and 10 grams of protein  -Ensure Max po daily, each supplement provides 150 kcal and 30 grams of protein -MVI with minerals daily  NUTRITION DIAGNOSIS:   Increased nutrient needs related to post-op healing as evidenced by estimated needs.  GOAL:   Patient will meet greater than or equal to 90% of their needs  MONITOR:   PO intake, Supplement acceptance, Labs, Weight trends, Skin, I & O's  REASON FOR ASSESSMENT:   Consult Assessment of nutrition requirement/status, Hip fracture protocol  ASSESSMENT:   [name] is a 87 year old female with history of hypothyroid, prior CVA, atrial fibrillation on Xarelto, hypertension, insulin-dependent diabetes mellitus, who presents emergency department at outpatient clinic for chief concerns of a fall witnessed by daughter  Pt admitted with a-fib and rt hip fracture.   Per orthopedics notes, plan for surgery tomorrow secondary to INR levels.   Pt sleepy at time of visit. SHe requested to have this RD lie her down and turn off her TV so she could take a nap. Noted minimal completion of breakfast tray (less than 25%). Pt consumed about half of her starbucks coffee drink.    Reviewed wt hx; pt has experienced a 4.5% wt loss over the past year, which is not significant for time frame.   Per RN, pt is refusing insulin injections.   Medications reviewed and include vitamin B-12 and 500 mg metformin BID.   Lab Results  Component Value Date   HGBA1C 8.3 (H) 04/12/2021   PTA DM medications are 500 mg metformin BID and 8 units insulin glargine daily.   Labs reviewed: Na: 133, CBGS: 147-225 (inpatient orders for glycemic control are 0-9 units insulin aspart TID with meals and 10 units insulin glargine-yfgn daily).    NUTRITION - FOCUSED PHYSICAL EXAM:  Flowsheet Row Most Recent Value   Orbital Region Moderate depletion  Upper Arm Region No depletion  Thoracic and Lumbar Region No depletion  Buccal Region No depletion  Temple Region Mild depletion  Clavicle Bone Region No depletion  Clavicle and Acromion Bone Region No depletion  Scapular Bone Region No depletion  Dorsal Hand No depletion  Patellar Region No depletion  Anterior Thigh Region No depletion  Posterior Calf Region No depletion  Edema (RD Assessment) None  Hair Reviewed  Eyes Reviewed  Mouth Reviewed  Skin Reviewed  Nails Reviewed       Diet Order:   Diet Order             Diet NPO time specified Except for: Sips with Meds  Diet effective midnight           Diet heart healthy/carb modified Room service appropriate? Yes; Fluid consistency: Thin  Diet effective now                   EDUCATION NEEDS:   No education needs have been identified at this time  Skin:  Skin Assessment: Reviewed RN Assessment  Last BM:  04/25/21  Height:   Ht Readings from Last 1 Encounters:  04/25/21 5\' 1"  (1.549 m)    Weight:   Wt Readings from Last 1 Encounters:  04/25/21 59.4 kg    Ideal Body Weight:  47.7 kg  BMI:  Body mass index is 24.75 kg/m.  Estimated Nutritional Needs:   Kcal:  1500-1700  Protein:  70-85 grams  Fluid:  >  1.5 L    Levada Schilling, RD, LDN, CDCES Registered Dietitian II Certified Diabetes Care and Education Specialist Please refer to [facility] for RD and/or RD on-call/weekend/after hours pager

## 2021-04-27 ENCOUNTER — Inpatient Hospital Stay: Payer: 59 | Admitting: Registered Nurse

## 2021-04-27 ENCOUNTER — Inpatient Hospital Stay: Admission: RE | Admit: 2021-04-27 | Payer: 59 | Source: Ambulatory Visit | Admitting: Surgery

## 2021-04-27 ENCOUNTER — Encounter
Admission: EM | Disposition: A | Discharge status: Home Health | Payer: Self-pay | Source: Home / Self Care | Attending: Internal Medicine

## 2021-04-27 ENCOUNTER — Inpatient Hospital Stay: Payer: 59

## 2021-04-27 ENCOUNTER — Encounter: Payer: Self-pay | Admitting: Internal Medicine

## 2021-04-27 ENCOUNTER — Other Ambulatory Visit: Payer: Self-pay

## 2021-04-27 HISTORY — PX: STERIOD INJECTION: SHX5046

## 2021-04-27 HISTORY — PX: INTRAMEDULLARY (IM) NAIL INTERTROCHANTERIC: SHX5875

## 2021-04-27 LAB — GLUCOSE, CAPILLARY
Glucose-Capillary: 165 mg/dL — ABNORMAL HIGH (ref 70–99)
Glucose-Capillary: 175 mg/dL — ABNORMAL HIGH (ref 70–99)
Glucose-Capillary: 256 mg/dL — ABNORMAL HIGH (ref 70–99)
Glucose-Capillary: 264 mg/dL — ABNORMAL HIGH (ref 70–99)

## 2021-04-27 LAB — CBC
HCT: 22.6 % — ABNORMAL LOW (ref 36.0–46.0)
Hemoglobin: 7 g/dL — ABNORMAL LOW (ref 12.0–15.0)
MCH: 24.6 pg — ABNORMAL LOW (ref 26.0–34.0)
MCHC: 31 g/dL (ref 30.0–36.0)
MCV: 79.3 fL — ABNORMAL LOW (ref 80.0–100.0)
Platelets: 174 10*3/uL (ref 150–400)
RBC: 2.85 MIL/uL — ABNORMAL LOW (ref 3.87–5.11)
RDW: 13.8 % (ref 11.5–15.5)
WBC: 12.2 10*3/uL — ABNORMAL HIGH (ref 4.0–10.5)
nRBC: 0 % (ref 0.0–0.2)

## 2021-04-27 LAB — BASIC METABOLIC PANEL
Anion gap: 9 (ref 5–15)
BUN: 25 mg/dL — ABNORMAL HIGH (ref 8–23)
CO2: 25 mmol/L (ref 22–32)
Calcium: 8.3 mg/dL — ABNORMAL LOW (ref 8.9–10.3)
Chloride: 97 mmol/L — ABNORMAL LOW (ref 98–111)
Creatinine, Ser: 0.72 mg/dL (ref 0.44–1.00)
GFR, Estimated: >60 mL/min (ref >60)
Glucose, Bld: 167 mg/dL — ABNORMAL HIGH (ref 70–99)
Potassium: 4 mmol/L (ref 3.5–5.1)
Sodium: 131 mmol/L — ABNORMAL LOW (ref 135–145)

## 2021-04-27 LAB — PREPARE RBC (CROSSMATCH)

## 2021-04-27 LAB — ABO/RH: ABO/RH(D): A POS

## 2021-04-27 LAB — PROTIME-INR
INR: 1.3 — ABNORMAL HIGH (ref 0.8–1.2)
Prothrombin Time: 16.1 seconds — ABNORMAL HIGH (ref 11.4–15.2)

## 2021-04-27 SURGERY — FIXATION, FRACTURE, INTERTROCHANTERIC, WITH INTRAMEDULLARY ROD
Anesthesia: General | Laterality: Right

## 2021-04-27 MED ORDER — METOCLOPRAMIDE HCL 5 MG/ML IJ SOLN: 5.0000 mg | Freq: Three times a day (TID) | INTRAMUSCULAR | Status: DC | PRN

## 2021-04-27 MED ORDER — HYDROCODONE-ACETAMINOPHEN 7.5-325 MG PO TABS: 1.0000 | ORAL_TABLET | ORAL | Status: DC | PRN

## 2021-04-27 MED ORDER — DIPHENHYDRAMINE HCL 12.5 MG/5ML PO ELIX: 12.5000 mg | ORAL_SOLUTION | ORAL | Status: DC | PRN

## 2021-04-27 MED ORDER — FENTANYL CITRATE (PF) 100 MCG/2ML IJ SOLN
INTRAMUSCULAR | Status: DC | PRN
  Administered 2021-04-27 (×2): 50 ug via INTRAVENOUS

## 2021-04-27 MED ORDER — SODIUM CHLORIDE 0.9% IV SOLUTION: Freq: Once | INTRAVENOUS | Status: DC

## 2021-04-27 MED ORDER — FLEET ENEMA 7-19 GM/118ML RE ENEM: 1.0000 | ENEMA | Freq: Once | RECTAL | Status: DC | PRN

## 2021-04-27 MED ORDER — CEFAZOLIN SODIUM-DEXTROSE 2-4 GM/100ML-% IV SOLN
2.0000 g | Freq: Four times a day (QID) | INTRAVENOUS | Status: AC
  Administered 2021-04-27 – 2021-04-28 (×2): 2 g via INTRAVENOUS
  Filled 2021-04-27 (×3): qty 100

## 2021-04-27 MED ORDER — PROPOFOL 10 MG/ML IV BOLUS
INTRAVENOUS | Status: DC | PRN
  Administered 2021-04-27: 70 mg via INTRAVENOUS

## 2021-04-27 MED ORDER — ACETAMINOPHEN 500 MG PO TABS
500.0000 mg | ORAL_TABLET | Freq: Four times a day (QID) | ORAL | Status: AC
  Administered 2021-04-27 – 2021-04-28 (×4): 500 mg via ORAL
  Filled 2021-04-27 (×3): qty 1

## 2021-04-27 MED ORDER — ONDANSETRON HCL 4 MG PO TABS
4.0000 mg | ORAL_TABLET | Freq: Four times a day (QID) | ORAL | Status: DC | PRN
Start: 2021-04-27 — End: 2021-05-02

## 2021-04-27 MED ORDER — DEXAMETHASONE SODIUM PHOSPHATE 10 MG/ML IJ SOLN
INTRAMUSCULAR | Status: DC | PRN
  Administered 2021-04-27: 5 mg via INTRAVENOUS

## 2021-04-27 MED ORDER — PROPOFOL 10 MG/ML IV BOLUS
INTRAVENOUS | Status: AC
  Filled 2021-04-27: qty 20

## 2021-04-27 MED ORDER — TRANEXAMIC ACID-NACL 1000-0.7 MG/100ML-% IV SOLN
INTRAVENOUS | Status: DC | PRN
  Administered 2021-04-27: 1000 mg via INTRAVENOUS

## 2021-04-27 MED ORDER — TRANEXAMIC ACID-NACL 1000-0.7 MG/100ML-% IV SOLN
1000.0000 mg | INTRAVENOUS | Status: DC
  Filled 2021-04-27: qty 100

## 2021-04-27 MED ORDER — BISACODYL 10 MG RE SUPP
10.0000 mg | Freq: Every day | RECTAL | Status: DC | PRN
  Filled 2021-04-27 (×2): qty 1

## 2021-04-27 MED ORDER — LIDOCAINE HCL (CARDIAC) PF 100 MG/5ML IV SOSY
PREFILLED_SYRINGE | INTRAVENOUS | Status: DC | PRN
Start: 2021-04-27 — End: 2021-04-27
  Administered 2021-04-27: 70 mg via INTRAVENOUS

## 2021-04-27 MED ORDER — PHENYLEPHRINE HCL (PRESSORS) 10 MG/ML IV SOLN
INTRAVENOUS | Status: DC | PRN
Start: 2021-04-27 — End: 2021-04-27
  Administered 2021-04-27 (×3): 120 ug via INTRAVENOUS
  Administered 2021-04-27: 80 ug via INTRAVENOUS
  Administered 2021-04-27: 120 ug via INTRAVENOUS

## 2021-04-27 MED ORDER — SODIUM CHLORIDE 0.9% IV SOLUTION: Freq: Once | INTRAVENOUS | Status: AC

## 2021-04-27 MED ORDER — ACETAMINOPHEN 325 MG PO TABS
325.0000 mg | ORAL_TABLET | Freq: Four times a day (QID) | ORAL | Status: DC | PRN
  Administered 2021-05-02: 650 mg via ORAL
  Filled 2021-04-27: qty 2

## 2021-04-27 MED ORDER — DOCUSATE SODIUM 100 MG PO CAPS
100.0000 mg | ORAL_CAPSULE | Freq: Two times a day (BID) | ORAL | Status: DC
  Administered 2021-04-27 – 2021-05-02 (×11): 100 mg via ORAL
  Filled 2021-04-27 (×11): qty 1

## 2021-04-27 MED ORDER — METOCLOPRAMIDE HCL 10 MG PO TABS: 5.0000 mg | ORAL_TABLET | Freq: Three times a day (TID) | ORAL | Status: DC | PRN

## 2021-04-27 MED ORDER — ONDANSETRON HCL 4 MG/2ML IJ SOLN: 4.0000 mg | Freq: Once | INTRAMUSCULAR | Status: DC | PRN

## 2021-04-27 MED ORDER — ONDANSETRON HCL 4 MG/2ML IJ SOLN: 4.0000 mg | Freq: Four times a day (QID) | INTRAMUSCULAR | Status: DC | PRN

## 2021-04-27 MED ORDER — FENTANYL CITRATE (PF) 100 MCG/2ML IJ SOLN: 25.0000 ug | INTRAMUSCULAR | Status: DC | PRN

## 2021-04-27 MED ORDER — SUCCINYLCHOLINE CHLORIDE 200 MG/10ML IV SOSY
PREFILLED_SYRINGE | INTRAVENOUS | Status: DC | PRN
Start: 2021-04-27 — End: 2021-04-27
  Administered 2021-04-27: 80 mg via INTRAVENOUS

## 2021-04-27 MED ORDER — MAGNESIUM HYDROXIDE 400 MG/5ML PO SUSP
30.0000 mL | Freq: Every day | ORAL | Status: DC | PRN
  Administered 2021-04-28 – 2021-04-30 (×2): 30 mL via ORAL
  Filled 2021-04-27 (×2): qty 30

## 2021-04-27 MED ORDER — 0.9 % SODIUM CHLORIDE (POUR BTL) OPTIME
TOPICAL | Status: DC | PRN
Start: 2021-04-27 — End: 2021-04-27
  Administered 2021-04-27: 1000 mL

## 2021-04-27 MED ORDER — TRIAMCINOLONE ACETONIDE 40 MG/ML IJ SUSP
INTRAMUSCULAR | Status: DC | PRN
  Administered 2021-04-27 (×2): 10 mL via INTRAMUSCULAR

## 2021-04-27 MED ORDER — RIVAROXABAN 20 MG PO TABS
20.0000 mg | ORAL_TABLET | Freq: Every day | ORAL | Status: DC
Start: 2021-04-28 — End: 2021-04-28
  Administered 2021-04-28: 20 mg via ORAL
  Filled 2021-04-27: qty 1

## 2021-04-27 MED ORDER — CEFAZOLIN SODIUM-DEXTROSE 2-4 GM/100ML-% IV SOLN
INTRAVENOUS | Status: AC
  Administered 2021-04-27: 2 g via INTRAVENOUS
  Filled 2021-04-27: qty 100

## 2021-04-27 MED ORDER — ONDANSETRON HCL 4 MG/2ML IJ SOLN
INTRAMUSCULAR | Status: DC | PRN
  Administered 2021-04-27: 4 mg via INTRAVENOUS

## 2021-04-27 MED ORDER — FENTANYL CITRATE (PF) 100 MCG/2ML IJ SOLN
INTRAMUSCULAR | Status: AC
Start: 2021-04-27 — End: ?
  Filled 2021-04-27: qty 2

## 2021-04-27 SURGICAL SUPPLY — 45 items
BIT DRILL CROWE POINT TWST 4.3 (DRILL) IMPLANT
BNDG COHESIVE 4X5 TAN ST LF (GAUZE/BANDAGES/DRESSINGS) ×2 IMPLANT
BNDG COHESIVE 6X5 TAN ST LF (GAUZE/BANDAGES/DRESSINGS) ×3 IMPLANT
CHLORAPREP W/TINT 26 (MISCELLANEOUS) ×6 IMPLANT
DRAPE 3/4 80X56 (DRAPES) ×3 IMPLANT
DRAPE C-ARMOR (DRAPES) ×3 IMPLANT
DRAPE INCISE IOBAN 66X60 STRL (DRAPES) ×1 IMPLANT
DRILL CROWE POINT TWIST 4.3 (DRILL) ×3
DRSG MEPILEX SACRM 8.7X9.8 (GAUZE/BANDAGES/DRESSINGS) ×3 IMPLANT
DRSG OPSITE POSTOP 3X4 (GAUZE/BANDAGES/DRESSINGS) IMPLANT
DRSG OPSITE POSTOP 4X6 (GAUZE/BANDAGES/DRESSINGS) IMPLANT
ELECT CAUTERY BLADE 6.4 (BLADE) ×3 IMPLANT
ELECT REM PT RETURN 9FT ADLT (ELECTROSURGICAL) ×3
ELECTRODE REM PT RTRN 9FT ADLT (ELECTROSURGICAL) ×2 IMPLANT
GAUZE SPONGE 4X4 12PLY STRL (GAUZE/BANDAGES/DRESSINGS) ×3 IMPLANT
GLOVE SURG ENC MOIS LTX SZ8 (GLOVE) ×6 IMPLANT
GLOVE SURG UNDER LTX SZ8 (GLOVE) ×3 IMPLANT
GOWN STRL REUS W/ TWL LRG LVL3 (GOWN DISPOSABLE) ×2 IMPLANT
GOWN STRL REUS W/ TWL XL LVL3 (GOWN DISPOSABLE) ×2 IMPLANT
GOWN STRL REUS W/TWL LRG LVL3 (GOWN DISPOSABLE) ×1
GOWN STRL REUS W/TWL XL LVL3 (GOWN DISPOSABLE) ×1
GUIDEPIN VERSANAIL DSP 3.2X444 (ORTHOPEDIC DISPOSABLE SUPPLIES) ×1 IMPLANT
GUIDEWIRE NATURAL NAIL 3X100 (WIRE) ×1 IMPLANT
HFN RH 130 DEG 9MM X 340MM (Nail) ×1 IMPLANT
HIP FRA NAIL LAG SCREW 10.5X90 (Orthopedic Implant) ×3 IMPLANT
MANIFOLD NEPTUNE II (INSTRUMENTS) ×3 IMPLANT
MAT ABSORB  FLUID 56X50 GRAY (MISCELLANEOUS) ×1
MAT ABSORB FLUID 56X50 GRAY (MISCELLANEOUS) ×2 IMPLANT
NDL FILTER BLUNT 18X1 1/2 (NEEDLE) ×2 IMPLANT
NEEDLE FILTER BLUNT 18X 1/2SAF (NEEDLE) ×1
NEEDLE FILTER BLUNT 18X1 1/2 (NEEDLE) ×2 IMPLANT
NEEDLE HYPO 22GX1.5 SAFETY (NEEDLE) ×4 IMPLANT
NS IRRIG 500ML POUR BTL (IV SOLUTION) ×4 IMPLANT
PACK HIP COMPR (MISCELLANEOUS) ×3 IMPLANT
SCREW BONE CORTICAL 5.0X38 (Screw) ×1 IMPLANT
SCREW LAG HIP FRA NAIL 10.5X90 (Orthopedic Implant) IMPLANT
STAPLER SKIN PROX 35W (STAPLE) ×3 IMPLANT
STRAP SAFETY 5IN WIDE (MISCELLANEOUS) ×3 IMPLANT
SUT VIC AB 0 CT1 36 (SUTURE) ×3 IMPLANT
SUT VIC AB 1 CT1 36 (SUTURE) ×3 IMPLANT
SUT VIC AB 2-0 CT1 (SUTURE) ×6 IMPLANT
SYR 10ML LL (SYRINGE) ×4 IMPLANT
SYR 30ML LL (SYRINGE) ×3 IMPLANT
TAPE MICROFOAM 4IN (TAPE) ×2 IMPLANT
WATER STERILE IRR 500ML POUR (IV SOLUTION) ×4 IMPLANT

## 2021-04-27 NOTE — Progress Notes (Signed)
Patient arrived back from PACU after hip surgery. Per PACU RN Britta Mccreedy, she only received one unit of blood from the two units ordered in Pre-op/OR. Britta Mccreedy, RN brought the blood back to room but it was too short of a window to be able to administer within the 4 hour window. 1 unit of blood returned to Blood Bank. Staff in Blood Bank stated that a new is needed for them to be to issue additional unit of blood, as unit returned was part of old original order for 2 units. Dr. Gerri Lins notified that new order is needed for 1 unit.   Bedside handoff received from PACU RN Britta Mccreedy.

## 2021-04-27 NOTE — Transfer of Care (Signed)
Immediate Anesthesia Transfer of Care Note  Patient: [name]  Procedure(s) Performed: INTRAMEDULLARY (IM) NAIL INTERTROCHANTRIC (Right) STEROID INJECTION TO BILATERAL KNEES (Bilateral)  Patient Location: PACU  Anesthesia Type:General  Level of Consciousness: awake and alert   Airway & Oxygen Therapy: Patient Spontanous Breathing and Patient connected to nasal cannula oxygen  Post-op Assessment: Report given to RN and Post -op Vital signs reviewed and stable  Post vital signs: Reviewed and stable  Last Vitals:  Vitals Value Taken Time  BP 132/79 04/27/21 1015  Temp    Pulse 132 04/27/21 1020  Resp 19 04/27/21 1020  SpO2 94 % 04/27/21 1020  Vitals shown include unvalidated device data.  Last Pain:  Vitals:   04/27/21 0550  TempSrc: Oral  PainSc:       Patients Stated Pain Goal: 0 (04/26/21 2354)  Complications: No notable events documented.

## 2021-04-27 NOTE — Op Note (Signed)
04/27/2021  10:11 AM  Patient:   [name]  Pre-Op Diagnosis:   1. Displaced intertrochanteric fracture, right hip.  2. Advanced degenerative joint disease both knees.  Post-Op Diagnosis:   Same  Procedure:   1. Reduction and internal fixation of displaced intertrochanteric right hip fracture with Biomet Affixis TFN nail.  2. Aspiration/injections of both knees.  Surgeon:   Maryagnes Amos, MD  Assistant:   None  Anesthesia:   GET  Findings:   As above  Complications:   None  EBL:   50 cc  Fluids:   900 cc crystalloid, 1 unit PRBC  UOP:   None  TT:   None  Drains:   None  Closure:   Staples  Implants:   Biomet Affixis 9 x 340 mm TFN with a 90 mm lag screw and a 38 mm distal interlocking screw  Brief Clinical Note:   The patient is an 87 year old female who sustained the above-noted injury 2 days ago when she lost her balance and fell coming out of her cardiologist's office. X-rays in the clinic demonstrated the above-noted fracture. She was brought to the emergency room, and then admitted to the hospitalist service in preparation for definitive management of this injury. The patient has been cleared medically and presents at this time for reduction and internal fixation of the displaced intertrochanteric right hip fracture.  According to the patient's daughter, the patient also has significant degenerative joint disease of both knees which will preclude her from being able to rehab optimally from her right hip fracture.  Therefore, she will undergo aspirations/injections of both knees while under anesthesia.   Procedure:   The patient was brought into the operating room and lain in the supine position. After adequate general endotracheal intubation and anesthesia was obtained, the patient was repositioned on the fracture table. After a timeout was performed, each knee was aspirated sterilely (60 cc of straw-colored slightly turbid fluid from the right knee and 50 cc of  straw-colored slightly turbid fluid from the left knee) before each knee was injected with a solution of 8 cc of 0.5% plain Sensorcaine and 2 cc of Kenalog-40 (80 mg).   The uninjured leg was placed in a flexed and abducted position while the injured lower extremity was placed in longitudinal traction. The fracture was reduced using longitudinal traction and internal rotation. The adequacy of reduction was verified fluoroscopically in AP and lateral projections and found to be near anatomic. The lateral aspects of the right hip and thigh were prepped with ChloraPrep solution before being draped sterilely. Preoperative antibiotics were administered. A repeat timeout was performed to verify the appropriate surgical site.   The greater trochanter was identified fluoroscopically and an approximately 3 cm incision made about 2-3 fingerbreadths above the tip of the greater trochanter. The incision was carried down through the subcutaneous tissues to expose the gluteal fascia. This was split the length of the incision, providing access to the tip of the trochanter. Under fluoroscopic guidance, a guidewire was drilled through the tip of the trochanter into the proximal metaphysis to the level of the lesser trochanter. After verifying its position fluoroscopically in AP and lateral projections, it was overreamed with the initial reamer to the depth of the lesser trochanter. A guidewire was passed down through the femoral canal to the supracondylar region. The adequacy of guidewire position was verified fluoroscopically in AP and lateral projections before the length of the guidewire within the canal was measured and found to be  365 mm. Therefore, a 340 mm length nail was selected. The guidewire was overreamed sequentially using the flexible reamers, beginning with a 9.5 mm reamer and progressing to an 11 mm reamer. This provided good cortical chatter. The 9 x 340 mm Biomet Affixis TFN rod was selected and advanced to the  appropriate depth, as verified fluoroscopically.   The guide system for the lag screw was positioned and advanced through an approximately 2 cm stab incision over the lateral aspect of the proximal femur. The guidewire was drilled up through the trochanteric femoral nail and into the femoral neck to rest within 5 mm of subchondral bone. After verifying its position in the femoral neck and head in both AP and lateral projections, the guidewire was measured and found to be optimally replicated by a 90 mm lag screw. The guidewire was overreamed to the appropriate depth before the lag screw was inserted and advanced to the appropriate depth as verified fluoroscopically in AP and lateral projections. The locking screw was advanced, then backed off a quarter turn to set the lag screw. Again the adequacy of hardware position and fracture reduction was verified fluoroscopically in AP and lateral projections and found to be excellent.  Attention was directed distally. Using the "perfect circle" technique, the leg and fluoroscopy machine were positioned appropriately. An approximately 1.5 cm stab incision was made over the skin at the appropriate point before the drill bit was advanced through the cortex and across the static hole of the nail. The appropriate length of the screw was determined before the 38 mm distal interlocking screw was positioned, then advanced and tightened securely. Again the adequacy of screw position was verified fluoroscopically in AP and lateral projections and found to be excellent.  The wounds were irrigated thoroughly with sterile saline solution before the abductor fascia was reapproximated using #0 Vicryl interrupted sutures. The subcutaneous tissues were closed using 2-0 Vicryl interrupted sutures. The skin was closed using staples. A total of 30 cc of 0.5% Sensorcaine with epinephrine was injected in and around all incisions. Sterile occlusive dressings were applied to all wounds before  the patient was transferred back to her hospital bed. The patient was then awakened, extubated, and returned to the recovery room in satisfactory condition after tolerating the procedure well.

## 2021-04-27 NOTE — Progress Notes (Signed)
Received report from off-going nurse Charlyn Minerva, LPN. Patient currently in Pre-Op, family at bedside updated and this RN reiterated the current visitor policy of only 2 visitors at bedside at a time.

## 2021-04-27 NOTE — Progress Notes (Signed)
[place] for initiation of rivaroxaban Indication: nonvalvular atrial fibrillation  Patient Measurements: Height: 5\' 1"  (154.9 cm) Weight: 59.4 kg (131 lb) IBW/kg (Calculated) : 47.8  Vital Signs: Temp: 97.6 F (36.4 C) (02/25 1148) Temp Source: Oral (02/25 0550) BP: 133/75 (02/25 1148) Pulse Rate: 93 (02/25 1148)  Labs: Recent Labs    04/25/21 1702 04/26/21 0524 04/27/21 0431  HGB 8.4* 7.7* 7.0*  HCT 27.7* 24.7* 22.6*  PLT 218 192 174  LABPROT 27.5* 19.2* 16.1*  INR 2.6* 1.6* 1.3*  CREATININE 0.66 0.85 0.72    Estimated Creatinine Clearance: 43.3 mL/min (by C-G formula based on SCr of 0.72 mg/dL).   Medical History: Past Medical History:  Diagnosis Date   Allergy    Atrial fibrillation ([place]) 2014   presumed as on xarelto after CVA 2014   Bilateral primary osteoarthritis of knee    severe by xrays   Carotid stenosis 10/25/2015   Mild by [place] 2014   Diabetes mellitus without complication ([place]) [id number]   Heart disease    Hepatitis    age 38   HLD (hyperlipidemia)    Hypertension    Hypothyroidism    Ischemic stroke ([place]) 2014   R basal ganglion infarct with L hemiparesis 2014, now largely resolved, uses cane   Rheumatic fever    Wears dentures    upper and lower    Medications:  Scheduled:   sodium chloride   Intravenous Once   sodium chloride   Intravenous Once   acetaminophen  500 mg Oral Q6H   atenolol  25 mg Oral Daily   docusate sodium  100 mg Oral BID   feeding supplement (GLUCERNA SHAKE)  237 mL Oral BID BM   ferrous sulfate  325 mg Oral QODAY   insulin aspart  0-5 Units Subcutaneous QHS   insulin aspart  0-9 Units Subcutaneous TID WC   insulin glargine  10 Units Subcutaneous Daily   levothyroxine  75 mcg Oral QAC breakfast   metFORMIN  500 mg Oral Q breakfast   multivitamin with minerals  1 tablet Oral Daily   Ensure Max Protein  11 oz Oral QHS   vitamin B-12  1,000 mcg Oral Q M,W,F    Assessment:  87 year old female with history of hypothyroid, prior CVA, atrial fibrillation on Xarelto, hypertension, insulin-dependent diabetes mellitus, who presents emergency department at outpatient clinic for chief concerns of a fall witnessed by daughter now s/p reduction and internal fixation of displaced intertrochanteric right hip fracture. She was taking rivaroxaban for atrial fibrillation prior to admission and surgery is clearing the patient to restart on 04/28/21  Goal of Therapy:  Monitor platelets by anticoagulation protocol: Yes   Plan:  Restart rivaroxaban 20 mg po once daily (CrCl 54.5 mL/min using actual body weight) on 04/28/21   Follow renal function for needed dosage adjustments    Dallie Piles 04/27/2021,12:50 PM

## 2021-04-27 NOTE — Evaluation (Signed)
Physical Therapy Evaluation Patient Details Name: [name] MRN: [id number] DOB: February 28, 1935 Today's Date: 04/27/2021  History of Present Illness  Pt admitted 04/25/21 for mechanical fall that resulted in R hip fx, s/p ORIF with TFN nail 2/25 by Dr. Joice Lofts. Reports hurting R shoulder during fall; imaging negative for acute fracture. Significant PMH includes: Afib on anticoagulation, HTN, hepatitis, hypothyroidism, and hx of CVA.   Clinical Impression  Pt is a 87 year old F admitted to hospital on 04/25/21 for R hip fx s/p fixation. PLOF obtained from granddaughter at bedside and daughter via phone. Rafael present as interpreter during session. At baseline, pt was mod I with toileting, bathing LB, and limited ambulation; family to assist with other ADL's, IADL's, transportation, and medication management. Pt presents with generalized weakness (RLE>LLE), increased pain levels, decreased balance, decreased activity tolerance, and limited RLE AROM secondary to pain/weakness, resulting in impaired functional mobility. Due to deficits, pt required max-total assist for bed mobility and total assist for transfers. Multiple STS and stand pivot transfers performed, however, further mobility and progress towards gait limited secondary to increased pain levels and limited tolerance with RLE WB. Pt, daughter, and granddaughter stating that R knee pain is worse than R hip pain, however, pt with increased c/o R hip pain with WB. Daughter and granddaughter inquiring about knee injections, as pt does better with overall mobility s/p knee injections. Explained that this will have to come from ortho MD, but that pt would likely benefit from them as R knee (as well as R hip) pain are limiting her overall function. Reached out to attending MD regarding matter, who verbalized they do not handle these matters, and that this decision would come from ortho MD. Will relay message to pt/family at tomorrow's PT session.  Deficits  limit the pt's ability to safely and independently perform ADL's, transfer, and ambulate. Pt will benefit from acute skilled PT services to address deficits for return to baseline function. At this time, PT recommends SNF at DC to address deficits and improve overall safety with functional mobility prior to return home. If family declines, will recommend DC home with +2 frequent/constant assist, HHPT, hospital bed, RW, and 3in1.        Recommendations for follow up therapy are one component of a multi-disciplinary discharge planning process, led by the attending physician.  Recommendations may be updated based on patient status, additional functional criteria and insurance authorization.  Follow Up Recommendations Skilled nursing-short term rehab (<3 hours/day)    Assistance Recommended at Discharge Intermittent Supervision/Assistance  Patient can return home with the following  Two people to help with walking and/or transfers;A lot of help with bathing/dressing/bathroom;Assistance with cooking/housework;Assist for transportation;Help with stairs or ramp for entrance    Equipment Recommendations Rolling walker (2 wheels);BSC/3in1;Hospital bed  Recommendations for Other Services  OT consult    Functional Status Assessment Patient has had a recent decline in their functional status and/or demonstrates limited ability to make significant improvements in function in a reasonable and predictable amount of time     Precautions / Restrictions Precautions Precautions: Fall Restrictions Weight Bearing Restrictions: Yes RLE Weight Bearing: Weight bearing as tolerated      Mobility  Bed Mobility Overal bed mobility: Needs Assistance Bed Mobility: Supine to Sit, Sit to Supine, Rolling Rolling: Max assist   Supine to sit: Max assist, HOB elevated Sit to supine: Total assist   General bed mobility comments: limited secondary to pain; multimodal cues for safety and sequencing  Transfers Overall transfer level: Needs assistance Equipment used: Rolling walker (2 wheels) Transfers: Sit to/from Stand Sit to Stand: Total assist, From elevated surface           General transfer comment: to stand from EOB x2 with RW; total for x2 stand pivot transfers to move up in bed    Ambulation/Gait               General Gait Details: unable to assess due to pain/inability to WB through RLE     Balance Overall balance assessment: Needs assistance Sitting-balance support: Bilateral upper extremity supported, Feet supported Sitting balance-Leahy Scale: Fair     Standing balance support: During functional activity, Bilateral upper extremity supported, No upper extremity supported Standing balance-Leahy Scale: Zero Standing balance comment: with/without RW                             Pertinent Vitals/Pain Pain Assessment Pain Assessment: 0-10 Pain Score: 5  Pain Location: 5/10 R knee; 10/10 R hip with WB Pain Intervention(s): Limited activity within patient's tolerance, Monitored during session, Repositioned, Premedicated before session (declining ice post session)    Home Living Family/patient expects to be discharged to:: Private residence Living Arrangements: Children;Other relatives Available Help at Discharge: Available 24 hours/day Type of Home: House         Home Layout: Two level;Able to live on main level with bedroom/bathroom Home Equipment: Rollator (4 wheels);Wheelchair - Manufacturing systems engineer;Toilet riser;Grab bars - toilet      Prior Function Prior Level of Function : Independent/Modified Independent;History of Falls (last six months)             Mobility Comments: Mod I with use of rollator; significant knee pain (R>L) due to arthritis. Daughter states pt does better s/p injections. ADLs Comments: Assist with bathing, dressing, medication management, transportation, and IADL's.        Extremity/Trunk Assessment    Upper Extremity Assessment Upper Extremity Assessment: RUE deficits/detail;Overall [facility] for tasks assessed RUE Deficits / Details: limited R shoulder flexion; granddaughter reports the pt injured it with this fall. no report of it in chart. Grossly 3+/5 with pain    Lower Extremity Assessment Lower Extremity Assessment: Generalized weakness;RLE deficits/detail RLE Deficits / Details: +1/5 strength; unable to WB due to pain. sensation and coordination intact       Communication   Communication: Prefers language other than English (spanish interpreter)  Cognition Arousal/Alertness: Awake/alert Behavior During Therapy: WFL for tasks assessed/performed Overall Cognitive Status: Within Functional Limits for tasks assessed                                 General Comments: A&O x3; states month as "March" but able to state year. Able to follow 100% of simple 1-step commands with interpreter        General Comments General comments (skin integrity, edema, etc.): dressing intact; Interpreter Rafael and granddaughter present for interpretation    Exercises Other Exercises Other Exercises: Bed mobility and transfers; grossly limited secondary to chronic R knee pain and acute R hip pain. Pt and family educated re: PT role/POC, pain management, benefits of early OOB mobility, RLE WB precautions, benefits of knee injections.   Assessment/Plan    PT Assessment Patient needs continued PT services  PT Problem List Decreased strength;Decreased range of motion;Decreased activity tolerance;Decreased balance;Decreased mobility;Pain       PT Treatment  Interventions DME instruction;Gait training;Functional mobility training;Therapeutic activities;Therapeutic exercise;Balance training;Neuromuscular re-education;Patient/family education    PT Goals (Current goals can be found in the Care Plan section)  Acute Rehab PT Goals Patient Stated Goal: "figure out why she fell" PT Goal  Formulation: With family Time For Goal Achievement: 05/11/21 Potential to Achieve Goals: Good    Frequency 7X/week        AM-PAC PT "6 Clicks" Mobility  Outcome Measure Help needed turning from your back to your side while in a flat bed without using bedrails?: A Lot Help needed moving from lying on your back to sitting on the side of a flat bed without using bedrails?: A Lot Help needed moving to and from a bed to a chair (including a wheelchair)?: Total Help needed standing up from a chair using your arms (e.g., wheelchair or bedside chair)?: Total Help needed to walk in hospital room?: Total Help needed climbing 3-5 steps with a railing? : Total 6 Click Score: 8    End of Session Equipment Utilized During Treatment: Gait belt Activity Tolerance: Patient limited by pain Patient left: in bed;with call bell/phone within reach;with bed alarm set;with family/visitor present Nurse Communication: Mobility status PT Visit Diagnosis: Unsteadiness on feet (R26.81);Muscle weakness (generalized) (M62.81);History of falling (Z91.81);Difficulty in walking, not elsewhere classified (R26.2);Pain Pain - Right/Left: Right Pain - part of body: Knee;Hip    Time: 1530-1630 PT Time Calculation (min) (ACUTE ONLY): 60 min   Charges:   PT Evaluation $PT Eval Moderate Complexity: 1 Mod PT Treatments $Therapeutic Activity: 23-37 mins           Vira Blanco, PT, DPT 5:03 PM,04/27/21

## 2021-04-27 NOTE — Anesthesia Preprocedure Evaluation (Signed)
Anesthesia Evaluation  Patient identified by MRN, date of birth, ID band Patient awake    Reviewed: Allergy & Precautions, H&P , NPO status , Patient's Chart, lab work & pertinent test results, reviewed documented beta blocker date and time   History of Anesthesia Complications Negative for: history of anesthetic complications  Airway Mallampati: II  TM Distance: >3 FB Neck ROM: full    Dental  (+) Poor Dentition, Dental Advidsory Given, Edentulous Upper, Missing   Pulmonary neg pulmonary ROS,    Pulmonary exam normal breath sounds clear to auscultation       Cardiovascular Exercise Tolerance: Good hypertension, (-) angina(-) Past MI and (-) Cardiac Stents + dysrhythmias Atrial Fibrillation (-) Valvular Problems/Murmurs Rhythm:regular Rate:Normal     Neuro/Psych neg Seizures CVA, Residual Symptoms negative psych ROS   GI/Hepatic Neg liver ROS, GERD  ,  Endo/Other  diabetesHypothyroidism   Renal/GU negative Renal ROS  negative genitourinary   Musculoskeletal   Abdominal   Peds  Hematology negative hematology ROS (+)   Anesthesia Other Findings Past Medical History: No date: Allergy 2014: Atrial fibrillation (HCC)     Comment:  presumed as on xarelto after CVA 2014 No date: Bilateral primary osteoarthritis of knee     Comment:  severe by xrays 10/25/2015: Carotid stenosis     Comment:  Mild by [place] 2014 1994: Diabetes mellitus without complication (HCC) No date: Heart disease No date: Hepatitis     Comment:  age 68 No date: HLD (hyperlipidemia) No date: Hypertension No date: Hypothyroidism 2014: Ischemic stroke (HCC)     Comment:  R basal ganglion infarct with L hemiparesis 2014, now               largely resolved, uses cane No date: Rheumatic fever No date: Wears dentures     Comment:  upper and lower   Reproductive/Obstetrics negative OB ROS                             Anesthesia  Physical Anesthesia Plan  ASA: 3  Anesthesia Plan: General   Post-op Pain Management:    Induction: Intravenous  PONV Risk Score and Plan: 3 and Ondansetron, Dexamethasone and Treatment may vary due to age or medical condition  Airway Management Planned: Oral ETT  Additional Equipment:   Intra-op Plan:   Post-operative Plan: Extubation in OR  Informed Consent: I have reviewed the patients History and Physical, chart, labs and discussed the procedure including the risks, benefits and alternatives for the proposed anesthesia with the patient or authorized representative who has indicated his/her understanding and acceptance.     Dental Advisory Given  Plan Discussed with: Anesthesiologist, CRNA and Surgeon  Anesthesia Plan Comments:         Anesthesia Quick Evaluation

## 2021-04-27 NOTE — H&P (Signed)
H&P and consent reviewed and patient re-examined. No changes pertaining to her history or physical examination.  Her preoperative lab work shows that her hemoglobin and hematocrit have decreased to 7.0/22.6.  Therefore, 2 units of packed cells are being prepared for transfusion intraoperatively/postoperatively.

## 2021-04-27 NOTE — Anesthesia Procedure Notes (Signed)
Procedure Name: Intubation Date/Time: 10/13/2021 10:21 AM Performed by: Berniece Pap, CRNA Pre-anesthesia Checklist: Patient identified, Emergency Drugs available, Suction available and Patient being monitored Patient Re-evaluated:Patient Re-evaluated prior to induction Oxygen Delivery Method: Circle system utilized Preoxygenation: Pre-oxygenation with 100% oxygen Induction Type: IV induction Ventilation: Mask ventilation without difficulty Tube type: Oral Number of attempts: 1 Airway Equipment and Method: Stylet and Oral airway Placement Confirmation: ETT inserted through vocal cords under direct vision, positive ETCO2 and breath sounds checked- equal and bilateral Tube secured with: Tape Dental Injury: Teeth and Oropharynx as per pre-operative assessment

## 2021-04-28 ENCOUNTER — Inpatient Hospital Stay: Payer: 59

## 2021-04-28 DIAGNOSIS — E039 Hypothyroidism, unspecified: Secondary | ICD-10-CM | POA: Diagnosis not present

## 2021-04-28 DIAGNOSIS — I480 Paroxysmal atrial fibrillation: Secondary | ICD-10-CM | POA: Diagnosis not present

## 2021-04-28 DIAGNOSIS — G3184 Mild cognitive impairment, so stated: Secondary | ICD-10-CM

## 2021-04-28 DIAGNOSIS — S72001A Fracture of unspecified part of neck of right femur, initial encounter for closed fracture: Secondary | ICD-10-CM

## 2021-04-28 DIAGNOSIS — D649 Anemia, unspecified: Secondary | ICD-10-CM | POA: Diagnosis not present

## 2021-04-28 DIAGNOSIS — D72829 Elevated white blood cell count, unspecified: Secondary | ICD-10-CM

## 2021-04-28 DIAGNOSIS — F5101 Primary insomnia: Secondary | ICD-10-CM

## 2021-04-28 DIAGNOSIS — E118 Type 2 diabetes mellitus with unspecified complications: Secondary | ICD-10-CM

## 2021-04-28 DIAGNOSIS — Z794 Long term (current) use of insulin: Secondary | ICD-10-CM

## 2021-04-28 DIAGNOSIS — E611 Iron deficiency: Secondary | ICD-10-CM

## 2021-04-28 LAB — CBC
HCT: 29.7 % — ABNORMAL LOW (ref 36.0–46.0)
Hemoglobin: 9.7 g/dL — ABNORMAL LOW (ref 12.0–15.0)
MCH: 26.6 pg (ref 26.0–34.0)
MCHC: 32.7 g/dL (ref 30.0–36.0)
MCV: 81.6 fL (ref 80.0–100.0)
Platelets: 144 10*3/uL — ABNORMAL LOW (ref 150–400)
RBC: 3.64 MIL/uL — ABNORMAL LOW (ref 3.87–5.11)
RDW: 14.3 % (ref 11.5–15.5)
WBC: 15 10*3/uL — ABNORMAL HIGH (ref 4.0–10.5)
nRBC: 0 % (ref 0.0–0.2)

## 2021-04-28 LAB — BPAM RBC
Blood Product Expiration Date: 202303132359
Blood Product Expiration Date: 202303132359
Blood Product Expiration Date: 202303132359
ISSUE DATE / TIME: 202302250826
ISSUE DATE / TIME: 202302250826
ISSUE DATE / TIME: 202302251758
Unit Type and Rh: 6200
Unit Type and Rh: 6200
Unit Type and Rh: 6200

## 2021-04-28 LAB — TYPE AND SCREEN
ABO/RH(D): A POS
Antibody Screen: NEGATIVE
Unit division: 0
Unit division: 0
Unit division: 0

## 2021-04-28 LAB — BASIC METABOLIC PANEL
Anion gap: 9 (ref 5–15)
BUN: 21 mg/dL (ref 8–23)
CO2: 21 mmol/L — ABNORMAL LOW (ref 22–32)
Calcium: 8.2 mg/dL — ABNORMAL LOW (ref 8.9–10.3)
Chloride: 100 mmol/L (ref 98–111)
Creatinine, Ser: 0.77 mg/dL (ref 0.44–1.00)
GFR, Estimated: >60 mL/min (ref >60)
Glucose, Bld: 233 mg/dL — ABNORMAL HIGH (ref 70–99)
Potassium: 4.2 mmol/L (ref 3.5–5.1)
Sodium: 130 mmol/L — ABNORMAL LOW (ref 135–145)

## 2021-04-28 LAB — GLUCOSE, CAPILLARY
Glucose-Capillary: 200 mg/dL — ABNORMAL HIGH (ref 70–99)
Glucose-Capillary: 242 mg/dL — ABNORMAL HIGH (ref 70–99)
Glucose-Capillary: 261 mg/dL — ABNORMAL HIGH (ref 70–99)
Glucose-Capillary: 232 mg/dL — ABNORMAL HIGH (ref 70–99)
Glucose-Capillary: 190 mg/dL — ABNORMAL HIGH (ref 70–99)

## 2021-04-28 LAB — HEMOGLOBIN AND HEMATOCRIT, BLOOD
HCT: 30 % — ABNORMAL LOW (ref 36.0–46.0)
Hemoglobin: 9.7 g/dL — ABNORMAL LOW (ref 12.0–15.0)

## 2021-04-28 NOTE — Assessment & Plan Note (Signed)
Continue ferrous sulfate 325 mg daily.

## 2021-04-28 NOTE — Plan of Care (Signed)
Late entry for PT goals. All PT goals are appropriate at this time.  Aleda Grana, PT, DPT 04/28/21, 1:29 PM  Problem: Acute Rehab PT Goals(only PT should resolve) Goal: Pt Will Go Supine/Side To Sit Flowsheets (Taken 04/28/2021 1328) Pt will go Supine/Side to Sit: with supervision Goal: Pt Will Go Sit To Supine/Side Flowsheets (Taken 04/28/2021 1328) Pt will go Sit to Supine/Side: with supervision Goal: Pt Will Transfer Bed To Chair/Chair To Bed Flowsheets (Taken 04/28/2021 1328) Pt will Transfer Bed to Chair/Chair to Bed: with min assist Note: With LRAD Goal: Pt Will Ambulate Flowsheets (Taken 04/28/2021 1328) Pt will Ambulate:  25 feet  with minimal assist  with least restrictive assistive device

## 2021-04-28 NOTE — Assessment & Plan Note (Addendum)
-   Hemoglobin stable and improving - Continue ferrous sulfate and B12 - Patient received 2 units PRBC on 04/27/2021

## 2021-04-28 NOTE — Progress Notes (Signed)
PROGRESS NOTE  Alencia Magstadt [id number] DOB: 02-07-1935 DOA: 04/25/2021 PCP: Ria Bush, MD  Brief History   [name] is a 87 year old female with past medical history significant for paroxysmal atrial fibrillation on Xarelto, hypothyroidism, essential hypertension, type 2 diabetes mellitus, Hx CVA who presented to [facility] ED on 2/23 following witnessed fall.  Patient was leaving her cardiologist office and her walker locked up causing her to trip and fall.  Patient with significant pain of her right hip.   In the ED, temperature 98.3 F, HR 125, RR 16, BP 111/72, SPO2 99% on room air.  Sodium 134, potassium 3.6, chloride 113, CO2 18, glucose 80, BUN 16, creatinine 0.66.  WBC 13.6, hemoglobin 8.4, platelet 218, INR 2.6.  COVID-19 PCR negative.  Influenza A/B PCR negative.  Chest x-ray with no acute cardiopulmonary disease process.  Right humerus x-ray negative.  Hip x-ray was performed in outpatient clinic that demonstrated a varus impacted/displaced intratrochanteric fracture of the right hip.  Orthopedics was consulted.  TRH consulted for further evaluation and management of hip fracture.  The patient went for ORIF on the right hip earlier today. She has tolerated the procedure well. Prior to her surgery the patient was found to have a hemoglobin of 7.0. 2 units of PRBC's were ordered, but only one was given before surgery. She is receiving the second unit now.  Consultants  Orthopedic surgery  Procedures  ORIF right hip  Antibiotics   Anti-infectives (From admission, onward)    Start     Dose/Rate Route Frequency Ordered Stop   04/27/21 1500  ceFAZolin (ANCEF) IVPB 2g/100 mL premix        2 g 200 mL/hr over 30 Minutes Intravenous Every 6 hours 04/27/21 1243 04/28/21 0417   04/27/21 0803  ceFAZolin (ANCEF) 2-4 GM/100ML-% IVPB       Note to Pharmacy: Mirna Mires: cabinet override      04/27/21 0803 04/27/21 2214   04/27/21 0000  ceFAZolin (ANCEF) IVPB 2g/100 mL  premix        2 g 200 mL/hr over 30 Minutes Intravenous 30 min pre-op 04/26/21 0822 04/27/21 0842   04/26/21 0600  ceFAZolin (ANCEF) IVPB 2g/100 mL premix  Status:  Discontinued        2 g 200 mL/hr over 30 Minutes Intravenous 30 min pre-op 04/25/21 1901 04/26/21 [id number]      Subjective  The patient is resting comfortably. No new complaints  Objective   Vitals:  Vitals:   04/27/21 2337 04/28/21 0349  BP: 123/78 134/78  Pulse: 92 82  Resp: 20 18  Temp: 99 F (37.2 C) 98.5 F (36.9 C)  SpO2: 100% 99%    Exam:  Constitutional:  The patient is awake, alert, and oriented x 3. No acute distress. Eyes:  pupils and irises appear normal Normal lids and conjunctivae ENMT:  grossly normal hearing  Lips appear normal external ears, nose appear normal Oropharynx: mucosa, tongue,posterior pharynx appear normal Neck:  neck appears normal, no masses, normal ROM, supple no thyromegaly Respiratory:  No increased work of breathing. No wheezes, rales, or rhonchi No tactile fremitus Cardiovascular:  Regular rate and rhythm No murmurs, ectopy, or gallups. No lateral PMI. No thrills. Abdomen:  Abdomen is soft, non-tender, non-distended No hernias, masses, or organomegaly Normoactive bowel sounds.  Musculoskeletal:  No cyanosis, clubbing, or edema Skin:  No rashes, lesions, ulcers palpation of skin: no induration or nodules Neurologic:  CN 2-12 intact Sensation all 4 extremities intact Psychiatric:  Mental status Mood,  affect appropriate Orientation to person, place, time  judgment and insight appear intact   I have personally reviewed the following:   Today's Data  Vitals  Lab Data  BMP CBC  Micro Data  None available  Imaging  X-ray right humerus X-ray chest  Cardiology Data  EKG  Other Data    Scheduled Meds:  sodium chloride   Intravenous Once   acetaminophen  500 mg Oral Q6H   atenolol  25 mg Oral Daily   docusate sodium  100 mg Oral BID    feeding supplement (GLUCERNA SHAKE)  237 mL Oral BID BM   ferrous sulfate  325 mg Oral QODAY   insulin aspart  0-5 Units Subcutaneous QHS   insulin aspart  0-9 Units Subcutaneous TID WC   insulin glargine  10 Units Subcutaneous Daily   levothyroxine  75 mcg Oral QAC breakfast   metFORMIN  500 mg Oral Q breakfast   multivitamin with minerals  1 tablet Oral Daily   Ensure Max Protein  11 oz Oral QHS   rivaroxaban  20 mg Oral Q supper   vitamin B-12  1,000 mcg Oral Q M,W,F   Continuous Infusions:  sodium chloride 75 mL/hr at 04/28/21 0206    Principal Problem:   Hip fracture (HCC) Active Problems:   Type 2 diabetes mellitus with complication, with long-term current use of insulin (HCC)   Hypothyroidism   Atrial fibrillation (HCC)   Insomnia   Iron deficiency   Anemia   MCI (mild cognitive impairment)   Leukocytosis   LOS: 3 days    A & P  Right hip fracture, POA Patient presenting to the ED following mechanical fall with findings of right intertrochanteric hip fracture. --Orthopedics following, appreciate assistance --Norco 1-2 tablets every 6 hours PRN moderate pain --Morphine 0.5 mg IV q2h PRN severe pain --Fentanyl IV PRN for severe pain not controlled with IV morphine --S/P ORIF of right hip earlier today. She has tolerated the procedure well   Hypertension Paroxysmal atrial fibrillation --Atenolol 25 mg p.o. daily --Holding Xarelto for anticipated surgery tomorrow   Type 2 diabetes mellitus --Semglee 10u [place] daily (daughter reports side effect to any other insulin except lantus; and will bring in home medication for use. --Metformin 500 mg p.o. daily --SSI for coverage --CBGs qAC/HS   Anemia --hgb 8.4>7.7>7.0 --Check anemia panel --Ferrous sulfate 325 mg p.o. every other day --Vitamin B12 1000 mcg Monday/Wednesday/Friday --The patient has received 2 units PRBC's in transfusion today   Hypothyroidism --Levothyroxine 75 mcg p.o. daily  I have seen and  examined this patient myself today. I have spent 35 minutes in her evaluation and care.     DVT prophylaxis: Place TED hose Start: 04/25/21 1857     Code Status: Full Code Family Communication: Daughter is at bedside. All questions answered to the best of my ability   Disposition Plan:  Level of care: Telemetry Medical Status is: Inpatient Remains inpatient appropriate because: Pending surgical intervention for right hip fracture, may need SNF placement, therapy evaluation postoperatively   Kla Bily, DO [facility] Direct contact: see [email or web address]  7PM-7AM contact night coverage as above 04/27/2021, 8:16 PM  LOS: 3 days

## 2021-04-28 NOTE — Progress Notes (Addendum)
Subjective: 1 Day Post-Op Procedure(s) (LRB): INTRAMEDULLARY (IM) NAIL INTERTROCHANTRIC (Right) STEROID INJECTION TO BILATERAL KNEES (Bilateral) Patient reports pain as mild.   Patient is well, and has had no acute complaints or problems Family in the room interprets for the patient. PT and care management to assist with d/c planning, current plan is for d/c to SNF pending progress with therapy. Negative for chest pain and shortness of breath Fever: no Gastrointestinal:Negative for nausea and vomiting  Objective: Vital signs in last 24 hours: Temp:  [97.6 F (36.4 C)-99 F (37.2 C)] 98 F (36.7 C) (02/26 0751) Pulse Rate:  [82-142] 91 (02/26 0751) Resp:  [16-22] 16 (02/26 0751) BP: (114-134)/(68-88) 127/71 (02/26 0751) SpO2:  [93 %-100 %] 98 % (02/26 0751)  Intake/Output from previous day:  Intake/Output Summary (Last 24 hours) at 04/28/2021 0851 Last data filed at 04/28/2021 0751 Gross per 24 hour  Intake 3730.37 ml  Output 950 ml  Net 2780.37 ml    Intake/Output this shift: Total I/O In: 485.9 [Blood:485.9] Out: -   Labs: Recent Labs    04/25/21 1702 04/26/21 0524 04/27/21 0431 04/28/21 0439  HGB 8.4* 7.7* 7.0* 9.7*   Recent Labs    04/27/21 0431 04/28/21 0439  WBC 12.2* 15.0*  RBC 2.85* 3.64*  HCT 22.6* 29.7*  PLT 174 144*   Recent Labs    04/27/21 0431 04/28/21 0439  NA 131* 130*  K 4.0 4.2  CL 97* 100  CO2 25 21*  BUN 25* 21  CREATININE 0.72 0.77  GLUCOSE 167* 233*  CALCIUM 8.3* 8.2*   Recent Labs    04/26/21 0524 04/27/21 0431  INR 1.6* 1.3*    EXAM General - Patient is Alert, Appropriate, and Oriented Extremity - ABD soft Neurovascular intact Dorsiflexion/Plantar flexion intact Incision: dressing C/D/I No cellulitis present Dressing/Incision - clean, dry, no drainage noted to the right hip incision sites. Motor Function - intact, moving foot and toes well on exam.  Abdomen soft to palpation with intact bowel sounds. Negative  Homan's to bilateral lower extremities.  Past Medical History:  Diagnosis Date   Allergy    Atrial fibrillation (HCC) 2014   presumed as on xarelto after CVA 2014   Bilateral primary osteoarthritis of knee    severe by xrays   Carotid stenosis 10/25/2015   Mild by [place] 2014   Diabetes mellitus without complication (HCC) 1994   Heart disease    Hepatitis    age 58   HLD (hyperlipidemia)    Hypertension    Hypothyroidism    Ischemic stroke (HCC) 2014   R basal ganglion infarct with L hemiparesis 2014, now largely resolved, uses cane   Rheumatic fever    Wears dentures    upper and lower   Assessment/Plan: 1 Day Post-Op Procedure(s) (LRB): INTRAMEDULLARY (IM) NAIL INTERTROCHANTRIC (Right) STEROID INJECTION TO BILATERAL KNEES (Bilateral) Principal Problem:   Hip fracture (HCC) Active Problems:   Type 2 diabetes mellitus with complication, with long-term current use of insulin (HCC)   Hypothyroidism   Atrial fibrillation (HCC)   Insomnia   Iron deficiency   Anemia   MCI (mild cognitive impairment)   Leukocytosis  Estimated body mass index is 24.75 kg/m as calculated from the following:   Height as of this encounter: 5\' 1"  (1.549 m).   Weight as of this encounter: 59.4 kg. Advance diet Up with therapy  Labs reviewed, WBC 15.0, likely reactionary from surgery, continue to monitor.  Denies any SOB, chest pain or urinary symptoms.  Hg 9.7 after 2 units transfused yesterday. Patient and family requesting updated knee x-rays.  Dr. Joice Lofts did aspirate and inject both knees yesterday and did mention that the right knee was seen on x-ratys of the femur however family is requesting updated x-rays, will obtain updated x-rays per family request. Up with therapy today.  Will likely need SNF upon discharge.  DVT Prophylaxis - Xarelto, TED hose, and SCDs Weight-Bearing as tolerated to right leg  J. Horris Latino, PA-C [facility] Orthopaedic Surgery 04/28/2021, 8:51 AM

## 2021-04-28 NOTE — Assessment & Plan Note (Signed)
Noted  

## 2021-04-28 NOTE — NC FL2 (Signed)
[place] LEVEL OF CARE SCREENING TOOL     IDENTIFICATION  Patient Name: [name] Birthdate: 1934/03/25 Sex: female Admission Date (Current Location): 04/25/2021  [facility] [place] Number:  Engineering geologist and Address:  [facility], [address], [place], [place] [address]      Provider Number: [id number]  Attending Physician Name and Address:  Karie Kirks, DO  Relative Name and Phone Number:  Dalphine Handing (Daughter)   [phone] (Home Phone)    Current Level of Care: Hospital Recommended Level of Care: [place] Prior Approval Number:    Date Approved/Denied:   PASRR Number: [id number]  Discharge Plan: SNF    Current Diagnoses: Patient Active Problem List   Diagnosis Date Noted   Leukocytosis 04/26/2021   Hip fracture ([place]) 04/25/2021   General weakness 04/13/2021   Recurrent falls 04/20/2020   MCI (mild cognitive impairment) 05/13/2019   Anemia 08/12/2017   Weight loss 08/10/2017   Encounter for general adult medical examination with abnormal findings 01/26/2016   Advanced care planning/counseling discussion 01/26/2016   Insomnia 01/26/2016   Iron deficiency 01/26/2016   Carotid stenosis 10/25/2015   Osteopenia 10/25/2015   Primary osteoarthritis of both knees 10/24/2015   Type 2 diabetes mellitus with complication, with long-term current use of insulin ([place]) 10/24/2015   Corns and callus 10/24/2015   Hypothyroidism    History of ischemic stroke without residual deficits 03/03/2012   Atrial fibrillation ([place]) 03/03/2012    Orientation RESPIRATION BLADDER Height & Weight     Self, Time, Situation, Place  Normal Continent Weight: 131 lb (59.4 kg) Height:  5\' 1"  (154.9 cm)  BEHAVIORAL SYMPTOMS/MOOD NEUROLOGICAL BOWEL NUTRITION STATUS      Continent Diet  AMBULATORY STATUS COMMUNICATION OF NEEDS Skin   Extensive Assist Verbally Normal                       Personal Care Assistance  Level of Assistance  Bathing, Feeding, Total care, Dressing Bathing Assistance: Maximum assistance Feeding assistance: Independent Dressing Assistance: Maximum assistance Total Care Assistance: Maximum assistance   Functional Limitations Info  Sight, Hearing, Speech Sight Info: Adequate Hearing Info: Adequate Speech Info: Adequate    SPECIAL CARE FACTORS FREQUENCY  PT (By licensed PT), OT (By licensed OT)     PT Frequency: 5X per week OT Frequency: 5X per week            Contractures Contractures Info: Not present    Additional Factors Info          JANSSEN COVID-19 VACCINE 02/06/2020 , 06/04/2019           Current Medications (04/28/2021):  This is the current hospital active medication list Current Facility-Administered Medications  Medication Dose Route Frequency Provider Last Rate Last Admin   0.9 %  sodium chloride infusion (Manually program via Guardrails IV Fluids)   Intravenous Once Poggi, Marshall Cork, MD       0.9 %  sodium chloride infusion   Intravenous Continuous Poggi, Marshall Cork, MD 75 mL/hr at 04/28/21 0206 Infusion Verify at 04/28/21 0206   acetaminophen (TYLENOL) tablet 325-650 mg  325-650 mg Oral Q6H PRN Poggi, Marshall Cork, MD       atenolol (TENORMIN) tablet 25 mg  25 mg Oral Daily Poggi, Marshall Cork, MD   25 mg at 04/28/21 0851   bisacodyl (DULCOLAX) suppository 10 mg  10 mg Rectal Daily PRN Poggi, Marshall Cork, MD  diphenhydrAMINE (BENADRYL) 12.5 MG/5ML elixir 12.5-25 mg  12.5-25 mg Oral Q4H PRN Poggi, Marshall Cork, MD       docusate sodium (COLACE) capsule 100 mg  100 mg Oral BID Poggi, Marshall Cork, MD   100 mg at 04/28/21 0851   feeding supplement (GLUCERNA SHAKE) (GLUCERNA SHAKE) liquid 237 mL  237 mL Oral BID BM Poggi, Marshall Cork, MD   237 mL at 04/28/21 1237   ferrous sulfate tablet 325 mg  325 mg Oral QODAY Poggi, Marshall Cork, MD   325 mg at 04/28/21 0851   HYDROcodone-acetaminophen (NORCO) 7.5-325 MG per tablet 1-2 tablet  1-2 tablet Oral Q4H PRN Poggi, Marshall Cork, MD        HYDROcodone-acetaminophen (NORCO/VICODIN) 5-325 MG per tablet 1-2 tablet  1-2 tablet Oral Q6H PRN Poggi, Marshall Cork, MD   1 tablet at 04/27/21 1518   insulin aspart (novoLOG) injection 0-5 Units  0-5 Units Subcutaneous QHS Corky Mull, MD   3 Units at 04/27/21 2213   insulin aspart (novoLOG) injection 0-9 Units  0-9 Units Subcutaneous TID WC Poggi, Marshall Cork, MD   3 Units at 04/28/21 1237   insulin glargine (LANTUS) injection 10 Units  10 Units Subcutaneous Daily Poggi, Marshall Cork, MD   10 Units at 04/28/21 [id number]   levothyroxine (SYNTHROID) tablet 75 mcg  75 mcg Oral QAC breakfast Corky Mull, MD   75 mcg at 04/28/21 [id number]   magnesium hydroxide (MILK OF MAGNESIA) suspension 30 mL  30 mL Oral Daily PRN Poggi, Marshall Cork, MD       metFORMIN (GLUCOPHAGE) tablet 500 mg  500 mg Oral Q breakfast Poggi, Marshall Cork, MD   500 mg at 04/28/21 [id number]   metoCLOPramide (REGLAN) tablet 5-10 mg  5-10 mg Oral Q8H PRN Poggi, Marshall Cork, MD       Or   metoCLOPramide (REGLAN) injection 5-10 mg  5-10 mg Intravenous Q8H PRN Poggi, Marshall Cork, MD       morphine (PF) 2 MG/ML injection 0.5 mg  0.5 mg Intravenous Q2H PRN Poggi, Marshall Cork, MD   0.5 mg at 04/26/21 2354   multivitamin with minerals tablet 1 tablet  1 tablet Oral Daily Poggi, Marshall Cork, MD   1 tablet at 04/28/21 0851   ondansetron (ZOFRAN) tablet 4 mg  4 mg Oral Q6H PRN Poggi, Marshall Cork, MD       Or   ondansetron (ZOFRAN) injection 4 mg  4 mg Intravenous Q6H PRN Poggi, Marshall Cork, MD       polyethylene glycol powder (GLYCOLAX/MIRALAX) container 17 g  17 g Oral Daily PRN Poggi, Marshall Cork, MD       protein supplement (ENSURE MAX) liquid  11 oz Oral QHS Poggi, Marshall Cork, MD   11 oz at 04/27/21 2205   rivaroxaban (XARELTO) tablet 20 mg  20 mg Oral Q supper Dallie Piles, RPH   20 mg at 04/28/21 [id number]   senna-docusate (Senokot-S) tablet 1 tablet  1 tablet Oral QHS PRN Poggi, Marshall Cork, MD       sodium phosphate (FLEET) 7-19 GM/118ML enema 1 enema  1 enema Rectal Once PRN Poggi, Marshall Cork, MD       traZODone  (DESYREL) tablet 25-50 mg  25-50 mg Oral QHS PRN Poggi, Marshall Cork, MD       vitamin B-12 (CYANOCOBALAMIN) tablet 1,000 mcg  1,000 mcg Oral Q M,W,F Poggi, Marshall Cork, MD   1,000 mcg at 04/26/21 1003     Discharge  Medications: Please see discharge summary for a list of discharge medications.  Relevant Imaging Results:  Relevant Lab Results:   Additional Information SS# [id number]  Adelene Amas, LCSW

## 2021-04-28 NOTE — Assessment & Plan Note (Addendum)
-   considered reactive; WBC has been downtrending

## 2021-04-28 NOTE — Assessment & Plan Note (Addendum)
The patient is s/p ORIF of the left hip on 04/27/2021. Recommendation is for SNF on discharge, but family has declined and wishes to take her home with home health

## 2021-04-28 NOTE — Progress Notes (Signed)
PROGRESS NOTE  [name] [id number] DOB: [birth year] DOA: 04/25/2021 PCP: Deanna Bush, MD  Brief History   [name] is a 87 year old female with past medical history significant for paroxysmal atrial fibrillation on Xarelto, hypothyroidism, essential hypertension, type 2 diabetes mellitus, Hx CVA who presented to [facility] ED on 2/23 following witnessed fall.  Patient was leaving her cardiologist office and her walker locked up causing her to trip and fall.  Patient with significant pain of her right hip.   In the ED, temperature 98.3 F, HR 125, RR 16, BP 111/72, SPO2 99% on room air.  Sodium 134, potassium 3.6, chloride 113, CO2 18, glucose 80, BUN 16, creatinine 0.66.  WBC 13.6, hemoglobin 8.4, platelet 218, INR 2.6.  COVID-19 PCR negative.  Influenza A/B PCR negative.  Chest x-ray with no acute cardiopulmonary disease process.  Right humerus x-ray negative.  Hip x-ray was performed in outpatient clinic that demonstrated a varus impacted/displaced intratrochanteric fracture of the right hip.  Orthopedics was consulted.  TRH consulted for further evaluation and management of hip fracture.  The patient went for ORIF on the right hip earlier today. She has tolerated the procedure well. Prior to her surgery the patient was found to have a hemoglobin of 7.0. 2 units of PRBC's were ordered, but only one was given before surgery. She received the second unit after her surgery.  Consultants  Orthopedic surgery  Procedures  ORIF right hip  Antibiotics   Anti-infectives (From admission, onward)    Start     Dose/Rate Route Frequency Ordered Stop   04/27/21 1500  ceFAZolin (ANCEF) IVPB 2g/100 mL premix        2 g 200 mL/hr over 30 Minutes Intravenous Every 6 hours 04/27/21 1243 04/28/21 0417   04/27/21 0803  ceFAZolin (ANCEF) 2-4 GM/100ML-% IVPB       Note to Pharmacy: [name]: cabinet override      04/27/21 0803 04/27/21 2214   04/27/21 0000  ceFAZolin (ANCEF) IVPB  2g/100 mL premix        2 g 200 mL/hr over 30 Minutes Intravenous 30 min pre-op 04/26/21 0822 04/27/21 0842   04/26/21 0600  ceFAZolin (ANCEF) IVPB 2g/100 mL premix  Status:  Discontinued        2 g 200 mL/hr over 30 Minutes Intravenous 30 min pre-op 04/25/21 1901 04/26/21 [id number]      Subjective  The patient is resting comfortably. No new complaints  Objective   Vitals:  Vitals:   04/28/21 0349 04/28/21 0751  BP: 134/78 127/71  Pulse: 82 91  Resp: 18 16  Temp: 98.5 F (36.9 C) 98 F (36.7 C)  SpO2: 99% 98%    Exam:  Constitutional:  The patient is awake, alert, and oriented x 3. No acute distress. Eyes:  pupils and irises appear normal Normal lids and conjunctivae ENMT:  grossly normal hearing  Lips appear normal external ears, nose appear normal Oropharynx: mucosa, tongue,posterior pharynx appear normal Neck:  neck appears normal, no masses, normal ROM, supple no thyromegaly Respiratory:  No increased work of breathing. No wheezes, rales, or rhonchi No tactile fremitus Cardiovascular:  Regular rate and rhythm No murmurs, ectopy, or gallups. No lateral PMI. No thrills. Abdomen:  Abdomen is soft, non-tender, non-distended No hernias, masses, or organomegaly Normoactive bowel sounds.  Musculoskeletal:  No cyanosis, clubbing, or edema Skin:  No rashes, lesions, ulcers palpation of skin: no induration or nodules Neurologic:  CN 2-12 intact Sensation all 4 extremities intact Psychiatric:  Mental status  Mood, affect appropriate Orientation to person, place, time  judgment and insight appear intact   I have personally reviewed the following:   Today's Data  Vitals  Lab Data  BMP CBC  Micro Data  None available  Imaging  X-ray right humerus X-ray chest  Cardiology Data  EKG  Other Data    Scheduled Meds:  sodium chloride   Intravenous Once   atenolol  25 mg Oral Daily   docusate sodium  100 mg Oral BID   feeding supplement (GLUCERNA  SHAKE)  237 mL Oral BID BM   ferrous sulfate  325 mg Oral QODAY   insulin aspart  0-5 Units Subcutaneous QHS   insulin aspart  0-9 Units Subcutaneous TID WC   insulin glargine  10 Units Subcutaneous Daily   levothyroxine  75 mcg Oral QAC breakfast   metFORMIN  500 mg Oral Q breakfast   multivitamin with minerals  1 tablet Oral Daily   Ensure Max Protein  11 oz Oral QHS   rivaroxaban  20 mg Oral Q supper   vitamin B-12  1,000 mcg Oral Q M,W,F   Continuous Infusions:  sodium chloride 75 mL/hr at 04/28/21 0206    Principal Problem:   Hip fracture (HCC) Active Problems:   Type 2 diabetes mellitus with complication, with long-term current use of insulin (HCC)   Hypothyroidism   Atrial fibrillation (HCC)   Insomnia   Iron deficiency   Anemia   MCI (mild cognitive impairment)   Leukocytosis   LOS: 3 days    A & P  Right hip fracture, POA Patient presenting to the ED following mechanical fall with findings of right intertrochanteric hip fracture. --Orthopedics following, appreciate assistance --Norco 1-2 tablets every 6 hours PRN moderate pain --Morphine 0.5 mg IV q2h PRN severe pain --Fentanyl IV PRN for severe pain not controlled with IV morphine --S/P ORIF of right hip earlier today. She has tolerated the procedure well   Hypertension Paroxysmal atrial fibrillation --Atenolol 25 mg p.o. daily --Holding Xarelto for anticipated surgery tomorrow   Type 2 diabetes mellitus --Semglee 10u [place] daily (daughter reports side effect to any other insulin except lantus; and will bring in home medication for use. --Metformin 500 mg p.o. daily --SSI for coverage --CBGs qAC/HS   Anemia --hgb 8.4>7.7>7.0 --Check anemia panel --Ferrous sulfate 325 mg p.o. every other day --Vitamin B12 1000 mcg Monday/Wednesday/Friday --The patient has received 2 units PRBC's in transfusion today   Hypothyroidism --Levothyroxine 75 mcg p.o. daily  I have seen and examined this patient myself  today. I have spent 32 minutes in her evaluation and care.   DVT prophylaxis: Place TED hose Start: 04/25/21 1857     Code Status: Full Code Family Communication: Daughter is at bedside. All questions answered to the best of my ability   Disposition Plan:  Level of care: Telemetry Medical Status is: Inpatient Remains inpatient appropriate because: Pending surgical intervention for right hip fracture, may need SNF placement, therapy evaluation postoperatively   Deanna Grothe, DO [facility] Direct contact: see [email or web address]  7PM-7AM contact night coverage as above 04/28/2021, 3:17 PM  LOS: 3 days

## 2021-04-28 NOTE — TOC Progression Note (Addendum)
Transition of Care [facility]) - Progression Note    Patient Details  Name: Rosha Quihuis MRN: [id number] Date of Birth: 1934/08/24  Transition of Care [facility]) CM/SW [place], [place] Phone Number: [phone] 04/28/2021, 3:49 PM  Clinical Narrative:     PT/OT recommend SNF placement.  CSW called left voicemail with contact information and requested for return call for patient's main contact Leach,Laura (Daughter) [phone]. CSW FL2 signed, SNF bed search started.       Expected Discharge Plan and Services                                                 Social Determinants of Health (SDOH) Interventions    Readmission Risk Interventions No flowsheet data found.

## 2021-04-28 NOTE — Plan of Care (Signed)
°  Problem: Education: Goal: Knowledge of General Education information will improve Description: Including pain rating scale, medication(s)/side effects and non-pharmacologic comfort measures Outcome: Progressing   Problem: Clinical Measurements: Goal: Ability to maintain clinical measurements within normal limits will improve Outcome: Progressing   Problem: Clinical Measurements: Goal: Diagnostic test results will improve Outcome: Progressing   Problem: Nutrition: Goal: Adequate nutrition will be maintained Outcome: Progressing   Problem: Coping: Goal: Level of anxiety will decrease Outcome: Progressing   Problem: Pain Managment: Goal: General experience of comfort will improve Outcome: Progressing   Problem: Safety: Goal: Ability to remain free from injury will improve Outcome: Progressing

## 2021-04-28 NOTE — Assessment & Plan Note (Addendum)
-   Continue trazodone PRN at night

## 2021-04-28 NOTE — Assessment & Plan Note (Addendum)
-   Continue atenolol.  Heart rate has been appropriate during monitoring - Continue Xarelto - Hematoma stable on right upper extremity with large surrounding ecchymosis

## 2021-04-28 NOTE — Assessment & Plan Note (Addendum)
-   TSH normal, 2.25 - Continue Synthroid

## 2021-04-28 NOTE — Progress Notes (Addendum)
Physical Therapy Treatment Patient Details Name: [name] MRN: [id number] DOB: 11-21-1934 Today's Date: 04/28/2021   History of Present Illness Pt admitted 04/25/21 for mechanical fall that resulted in R hip fx, s/p ORIF with TFN nail 2/25 by Dr. Joice Lofts. Reports hurting R shoulder during fall; imaging negative for acute fracture. Significant PMH includes: Afib on anticoagulation, HTN, hepatitis, hypothyroidism, and hx of CVA.    PT Comments    Daughter in room and refuses interpreter.  Lengthy discussion and answering daughters questions.  She does not feel any SNF will be acceptable to her but she wishes to get options but plan will likely be to take pt home.  Updated equipment needs.  Will need HHPT and aide service as available along with +2 assist from family for transfers.  Daughter is aware of assist needs.  To EOB with mod/max a x 1.  Steady in sitting for complete bath and linen change.  Stood x 2 at bedside with mod a x 2 and RW but she is unable to take any functional steps to walk.  Returned to supine with max a x 2.  Will plan on 1:00 therapy session with daughter.  She was involved with care today and education started but will continue tomorrow. OT consult is requested.    Recommendations for follow up therapy are one component of a multi-disciplinary discharge planning process, led by the attending physician.  Recommendations may be updated based on patient status, additional functional criteria and insurance authorization.  Follow Up Recommendations  Skilled nursing-short term rehab (<3 hours/day)     Assistance Recommended at Discharge Intermittent Supervision/Assistance  Patient can return home with the following Two people to help with walking and/or transfers;A lot of help with bathing/dressing/bathroom;Assistance with cooking/housework;Assist for transportation;Help with stairs or ramp for entrance   Equipment Recommendations  Rolling walker (2  wheels);BSC/3in1;Hospital bed;Wheelchair (measurements PT);Wheelchair cushion (measurements PT)    Recommendations for Other Services       Precautions / Restrictions Precautions Precautions: Fall Restrictions Weight Bearing Restrictions: Yes RLE Weight Bearing: Weight bearing as tolerated     Mobility  Bed Mobility Overal bed mobility: Needs Assistance Bed Mobility: Supine to Sit, Sit to Supine     Supine to sit: Max assist Sit to supine: Total assist, +2 for physical assistance        Transfers Overall transfer level: Needs assistance Equipment used: Rolling walker (2 wheels) Transfers: Sit to/from Stand Sit to Stand: Max assist, +2 physical assistance                Ambulation/Gait               General Gait Details: uanble.  takes a very small shuffle sidestep but no functional   Stairs             Wheelchair Mobility    Modified Rankin (Stroke Patients Only)       Balance Overall balance assessment: Needs assistance Sitting-balance support: Bilateral upper extremity supported, Feet supported Sitting balance-Leahy Scale: Fair     Standing balance support: During functional activity, Bilateral upper extremity supported, No upper extremity supported Standing balance-Leahy Scale: Zero                              Cognition Arousal/Alertness: Awake/alert Behavior During Therapy: WFL for tasks assessed/performed Overall Cognitive Status: Within Functional Limits for tasks assessed  General Comments: daughter refusing interpreter.        Exercises Other Exercises Other Exercises: complete bath and linen change    General Comments        Pertinent Vitals/Pain Pain Assessment Pain Assessment: Faces Faces Pain Scale: Hurts little more Pain Location: 5/10 R knee; 10/10 R hip/knee  with WB Pain Descriptors / Indicators: Sore Pain Intervention(s): Limited activity within  patient's tolerance, Monitored during session, Repositioned    Home Living                          Prior Function            PT Goals (current goals can now be found in the care plan section) Progress towards PT goals: Progressing toward goals    Frequency    7X/week      PT Plan Current plan remains appropriate;Other (comment)    Co-evaluation              AM-PAC PT "6 Clicks" Mobility   Outcome Measure  Help needed turning from your back to your side while in a flat bed without using bedrails?: A Lot Help needed moving from lying on your back to sitting on the side of a flat bed without using bedrails?: A Lot Help needed moving to and from a bed to a chair (including a wheelchair)?: Total Help needed standing up from a chair using your arms (e.g., wheelchair or bedside chair)?: A Lot Help needed to walk in hospital room?: Total Help needed climbing 3-5 steps with a railing? : Total 6 Click Score: 9    End of Session Equipment Utilized During Treatment: Gait belt Activity Tolerance: Patient limited by fatigue;Patient limited by pain Patient left: in bed;with call bell/phone within reach;with bed alarm set;with family/visitor present;with nursing/sitter in room Nurse Communication: Mobility status PT Visit Diagnosis: Unsteadiness on feet (R26.81);Muscle weakness (generalized) (M62.81);History of falling (Z91.81);Difficulty in walking, not elsewhere classified (R26.2);Pain Pain - Right/Left: Right Pain - part of body: Knee;Hip     Time: 0277-4128 PT Time Calculation (min) (ACUTE ONLY): 39 min  Charges:  $Therapeutic Exercise: 23-37 mins $Therapeutic Activity: 8-22 mins                    Danielle Dess, PTA 04/28/21, 1:24 PM

## 2021-04-28 NOTE — Assessment & Plan Note (Addendum)
-   Family reports intolerance to other insulins aside from Lantus therefore using home supply - A1c 8.3% on 04/12/2021 - Continue metformin and SSI

## 2021-04-29 ENCOUNTER — Other Ambulatory Visit (INDEPENDENT_AMBULATORY_CARE_PROVIDER_SITE_OTHER): Payer: 59

## 2021-04-29 ENCOUNTER — Encounter: Payer: Self-pay | Admitting: Surgery

## 2021-04-29 DIAGNOSIS — S40021D Contusion of right upper arm, subsequent encounter: Secondary | ICD-10-CM

## 2021-04-29 DIAGNOSIS — D509 Iron deficiency anemia, unspecified: Secondary | ICD-10-CM | POA: Diagnosis not present

## 2021-04-29 DIAGNOSIS — D649 Anemia, unspecified: Secondary | ICD-10-CM | POA: Diagnosis not present

## 2021-04-29 DIAGNOSIS — I480 Paroxysmal atrial fibrillation: Secondary | ICD-10-CM | POA: Diagnosis not present

## 2021-04-29 DIAGNOSIS — S72001A Fracture of unspecified part of neck of right femur, initial encounter for closed fracture: Secondary | ICD-10-CM | POA: Diagnosis not present

## 2021-04-29 LAB — BASIC METABOLIC PANEL WITH GFR
Anion gap: 11 (ref 5–15)
BUN: 28 mg/dL — ABNORMAL HIGH (ref 8–23)
CO2: 24 mmol/L (ref 22–32)
Calcium: 8.4 mg/dL — ABNORMAL LOW (ref 8.9–10.3)
Chloride: 99 mmol/L (ref 98–111)
Creatinine, Ser: 0.66 mg/dL (ref 0.44–1.00)
GFR, Estimated: >60 mL/min (ref >60)
Glucose, Bld: 209 mg/dL — ABNORMAL HIGH (ref 70–99)
Potassium: 4.3 mmol/L (ref 3.5–5.1)
Sodium: 134 mmol/L — ABNORMAL LOW (ref 135–145)

## 2021-04-29 LAB — CBC
HCT: 30.1 % — ABNORMAL LOW (ref 36.0–46.0)
Hemoglobin: 9.8 g/dL — ABNORMAL LOW (ref 12.0–15.0)
MCH: 26.5 pg (ref 26.0–34.0)
MCHC: 32.6 g/dL (ref 30.0–36.0)
MCV: 81.4 fL (ref 80.0–100.0)
Platelets: 182 10*3/uL (ref 150–400)
RBC: 3.7 MIL/uL — ABNORMAL LOW (ref 3.87–5.11)
RDW: 14.9 % (ref 11.5–15.5)
WBC: 17.9 10*3/uL — ABNORMAL HIGH (ref 4.0–10.5)
nRBC: 0 % (ref 0.0–0.2)

## 2021-04-29 LAB — GLUCOSE, CAPILLARY
Glucose-Capillary: 151 mg/dL — ABNORMAL HIGH (ref 70–99)
Glucose-Capillary: 192 mg/dL — ABNORMAL HIGH (ref 70–99)
Glucose-Capillary: 193 mg/dL — ABNORMAL HIGH (ref 70–99)
Glucose-Capillary: 186 mg/dL — ABNORMAL HIGH (ref 70–99)
Glucose-Capillary: 262 mg/dL — ABNORMAL HIGH (ref 70–99)

## 2021-04-29 LAB — PREPARE RBC (CROSSMATCH)

## 2021-04-29 NOTE — Evaluation (Signed)
Occupational Therapy Evaluation Patient Details Name: [name] MRN: [id number] DOB: [birth year] Today's Date: 04/29/2021   History of Present Illness Pt admitted 04/25/21 for mechanical fall that resulted in R hip fx, s/p ORIF with TFN nail 2/25 by Dr. Roland Rack. Reports hurting R shoulder during fall; imaging negative for acute fracture. Significant PMH includes: Afib on anticoagulation, HTN, hepatitis, hypothyroidism, and hx of CVA.   Clinical Impression   Pt was seen for OT evaluation and co-tx with PT this date. Prior to hospital admission, pt was mod indep with rollator for mobility and family assisted with bathing, dressing, and IADL. Currently pt demonstrates impairments as described below (See OT problem list) which functionally limit her ability to perform ADL/self-care tasks. Pt currently requires MOD-MAX A +2 for ADL transfers (STS, SPT) and MAX A for pericare and clothing mgt. Dtr and granddtr present and involved in caregiver training/education with dtr able to return demo techniques to assist in ADL transfers. Pt would benefit from skilled OT services to address noted impairments and functional limitations (see below for any additional details) in order to maximize safety and independence while minimizing falls risk and caregiver burden. Upon hospital discharge, recommend STR to maximize pt safety and return to PLOF. Will continue to work towards family's goal of return home with [facility] while hospitalized.    Recommendations for follow up therapy are one component of a multi-disciplinary discharge planning process, led by the attending physician.  Recommendations may be updated based on patient status, additional functional criteria and insurance authorization.   Follow Up Recommendations  Skilled nursing-short term rehab (<3 hours/day)    Assistance Recommended at Discharge Frequent or constant Supervision/Assistance  Patient can return home with the following Two people to help with  bathing/dressing/bathroom;Two people to help with walking and/or transfers;Assist for transportation;Assistance with cooking/housework;Help with stairs or ramp for entrance;Direct supervision/assist for medications management (family intend to take pt home)    Functional Status Assessment  Patient has had a recent decline in their functional status and demonstrates the ability to make significant improvements in function in a reasonable and predictable amount of time.  Equipment Recommendations  Hospital bed;BSC/3in1;Other (comment) (hoyer lift)    Recommendations for Other Services       Precautions / Restrictions Precautions Precautions: Fall Precaution Comments: B knee arthritis Restrictions Weight Bearing Restrictions: Yes RLE Weight Bearing: Weight bearing as tolerated      Mobility Bed Mobility Overal bed mobility: Needs Assistance Bed Mobility: Sit to Supine       Sit to supine: Mod assist, +2 for physical assistance, Max assist   General bed mobility comments: pt seated EOB when OT arrived, MOD-MAX A +2 for return to bed, limited by pain    Transfers Overall transfer level: Needs assistance Equipment used: Rolling walker (2 wheels) Transfers: Sit to/from Stand, Bed to chair/wheelchair/BSC Sit to Stand: Mod assist, +2 physical assistance Stand pivot transfers: Max assist, +2 physical assistance         General transfer comment: caregiver training/education throughout for sequencing, assist, optimal positioning      Balance Overall balance assessment: Needs assistance Sitting-balance support: Bilateral upper extremity supported, Feet supported Sitting balance-Leahy Scale: Fair     Standing balance support: During functional activity, Bilateral upper extremity supported, No upper extremity supported Standing balance-Leahy Scale: Poor                             ADL either performed or assessed with  clinical judgement   ADL Overall ADL's :  Needs assistance/impaired                                       General ADL Comments: Pt requires MAX A for clothing mgt and pericare, MOD A +2 for ADL transfers, MAX A +2 for stand pivot EOB<>BSC, VC for sequencing. Sitting and set up for seated UB ADL     Vision         Perception     Praxis      Pertinent Vitals/Pain Pain Assessment Pain Assessment: Faces Faces Pain Scale: Hurts even more Pain Location: knees R hip Pain Descriptors / Indicators: Sore, Grimacing, Guarding Pain Intervention(s): Limited activity within patient's tolerance, Monitored during session, Premedicated before session, Repositioned     Hand Dominance     Extremity/Trunk Assessment Upper Extremity Assessment Upper Extremity Assessment: Overall WFL for tasks assessed;RUE deficits/detail RUE Deficits / Details: limited R shoulder flexion; granddaughter reports the pt injured it with this fall. no report of it in chart. Grossly 3+/5 with pain   Lower Extremity Assessment Lower Extremity Assessment: Generalized weakness;RLE deficits/detail RLE: Unable to fully assess due to pain       Communication Communication Communication: Prefers language other than English (spanish interpreter)   Cognition Arousal/Alertness: Awake/alert Behavior During Therapy: WFL for tasks assessed/performed Overall Cognitive Status: Within Functional Limits for tasks assessed                                 General Comments: daughter refusing interpreter.     General Comments       Exercises     Shoulder Instructions      Home Living Family/patient expects to be discharged to:: Private residence Living Arrangements: Children;Other relatives Available Help at Discharge: Available 24 hours/day Type of Home: House       Home Layout: Two level;Able to live on main level with bedroom/bathroom     Bathroom Shower/Tub: Teacher, early years/pre: Standard     Home  Equipment: Rollator (4 wheels);Wheelchair - Brewing technologist;Toilet riser;Grab bars - toilet          Prior Functioning/Environment Prior Level of Function : Independent/Modified Independent;History of Falls (last six months)             Mobility Comments: Mod I with use of rollator; significant knee pain (R>L) due to arthritis. Daughter states pt does better s/p injections. ADLs Comments: Assist with bathing, dressing, medication management, transportation, and IADL's.        OT Problem List: Decreased strength;Pain;Decreased range of motion;Decreased activity tolerance;Impaired balance (sitting and/or standing);Decreased knowledge of use of DME or AE      OT Treatment/Interventions: Self-care/ADL training;Therapeutic exercise;Therapeutic activities;DME and/or AE instruction;Patient/family education;Balance training    OT Goals(Current goals can be found in the care plan section) Acute Rehab OT Goals Patient Stated Goal: go home with family OT Goal Formulation: With patient/family Time For Goal Achievement: 05/13/21 Potential to Achieve Goals: Good ADL Goals Pt Will Perform Lower Body Dressing: with caregiver independent in assisting;with mod assist;sitting/lateral leans;sit to/from stand Pt Will Transfer to Toilet: with mod assist;with +2 assist;stand pivot transfer;bedside commode Pt Will Perform Toileting - Clothing Manipulation and hygiene: sitting/lateral leans;with max assist;with caregiver independent in assisting  OT Frequency: Min 2X/week    Co-evaluation PT/OT/SLP Co-Evaluation/Treatment: Yes  Reason for Co-Treatment: Complexity of the patient's impairments (multi-system involvement);For patient/therapist safety;To address functional/ADL transfers PT goals addressed during session: Mobility/safety with mobility;Strengthening/ROM OT goals addressed during session: ADL's and self-care      AM-PAC OT "6 Clicks" Daily Activity     Outcome Measure Help from another  person eating meals?: None Help from another person taking care of personal grooming?: A Little Help from another person toileting, which includes using toliet, bedpan, or urinal?: Total Help from another person bathing (including washing, rinsing, drying)?: A Lot Help from another person to put on and taking off regular upper body clothing?: A Little Help from another person to put on and taking off regular lower body clothing?: A Lot 6 Click Score: 15   End of Session Equipment Utilized During Treatment: Gait belt;Rolling walker (2 wheels)  Activity Tolerance: Patient tolerated treatment well;Patient limited by pain Patient left: in bed;with call bell/phone within reach;with bed alarm set;with family/visitor present  OT Visit Diagnosis: Other abnormalities of gait and mobility (R26.89);Muscle weakness (generalized) (M62.81);Pain Pain - Right/Left: Right Pain - part of body: Hip;Knee                Time: [id number] OT Time Calculation (min): 26 min Charges:  OT General Charges $OT Visit: 1 Visit OT Evaluation $OT Eval Moderate Complexity: 1 Mod  Ardeth Perfect., MPH, MS, OTR/L ascom [phone] 04/29/21, 4:09 PM

## 2021-04-29 NOTE — Plan of Care (Signed)
°  Problem: Education: Goal: Knowledge of General Education information will improve Description: Including pain rating scale, medication(s)/side effects and non-pharmacologic comfort measures Outcome: Progressing   Problem: Clinical Measurements: Goal: Ability to maintain clinical measurements within normal limits will improve Outcome: Progressing   Problem: Clinical Measurements: Goal: Will remain free from infection Outcome: Progressing   Problem: Clinical Measurements: Goal: Diagnostic test results will improve Outcome: Progressing   Problem: Clinical Measurements: Goal: Cardiovascular complication will be avoided Outcome: Progressing   Problem: Nutrition: Goal: Adequate nutrition will be maintained Outcome: Progressing   Problem: Coping: Goal: Level of anxiety will decrease Outcome: Progressing   Problem: Pain Managment: Goal: General experience of comfort will improve Outcome: Progressing   Problem: Safety: Goal: Ability to remain free from injury will improve Outcome: Progressing

## 2021-04-29 NOTE — Clinical Social Work Note (Signed)
Patient suffers from hip fracture which impairs their ability to perform daily activities like A wheelchair will allow patient to safely perform daily activities. Patient is not able to propel themselves in the home using a standard weight wheelchair due to weakness. Patient can self propel in the lightweight wheelchair.

## 2021-04-29 NOTE — Progress Notes (Addendum)
Physical Therapy Treatment Patient Details Name: [name] MRN: [id number] DOB: 23-Jun-1934 Today's Date: 04/29/2021   History of Present Illness Pt admitted 04/25/21 for mechanical fall that resulted in R hip fx, s/p ORIF with TFN nail 2/25 by Dr. Roland Rack. Reports hurting R shoulder during fall; imaging negative for acute fracture. Significant PMH includes: Afib on anticoagulation, HTN, hepatitis, hypothyroidism, and hx of CVA.    PT Comments    Co-tx with OT for for PT session and OT eval.  Daughter in for training.  Daughter prefers to not use interpreter and conveys info well to patient.    Education for supine ROM with daughter participating in care.  To EOB with mod a x 1.  Stood x 2 with RW and mod a x 2.  Unable to step and only stands for a few moments before needing to sit.  She is transferred to [facility] for attempts at [facility].  Only voids.  Max +2 for transition.  Daughter is actively participating in care and education in preparation for discharge home.    Discharge home vs SNF at this time per family decision.  They will need full equipment and additional care available to them along with HHPT services.  A lift is also requested by daughter.   Patient suffers from hip fx along with extensive arthritis BLE which impairs his/her ability to perform daily activities like toileting, feeding, dressing, grooming, bathing in the home. A cane, walker, crutch will not resolve the patient's issue with performing activities of daily living. A lightweight wheelchair and cushion is required/recommended and will allow patient to safely perform daily activities.   Patient can safely propel the wheelchair in the home or has a caregiver who can provide assistance.     Recommendations for follow up therapy are one component of a multi-disciplinary discharge planning process, led by the attending physician.  Recommendations may be updated based on patient status, additional functional criteria and insurance  authorization.  Follow Up Recommendations  Home health PT     Assistance Recommended at Discharge Frequent or constant Supervision/Assistance  Patient can return home with the following Two people to help with walking and/or transfers;A lot of help with bathing/dressing/bathroom;Assistance with cooking/housework;Assist for transportation;Help with stairs or ramp for entrance   Equipment Recommendations  Rolling walker (2 wheels);BSC/3in1;Hospital bed;Wheelchair (measurements PT);Wheelchair cushion (measurements PT);Other (comment) (lift)    Recommendations for Other Services       Precautions / Restrictions Precautions Precautions: Fall Precaution Comments: B knee arthritis Restrictions Weight Bearing Restrictions: Yes RLE Weight Bearing: Weight bearing as tolerated     Mobility  Bed Mobility Overal bed mobility: Needs Assistance Bed Mobility: Supine to Sit, Sit to Supine     Supine to sit: Mod assist Sit to supine: Mod assist, +2 for physical assistance, Max assist        Transfers Overall transfer level: Needs assistance Equipment used: Rolling walker (2 wheels) Transfers: Sit to/from Stand, Bed to chair/wheelchair/BSC Sit to Stand: Mod assist Stand pivot transfers: Max assist, +2 physical assistance              Ambulation/Gait                   Stairs             Wheelchair Mobility    Modified Rankin (Stroke Patients Only)       Balance  Cognition Arousal/Alertness: Awake/alert Behavior During Therapy: WFL for tasks assessed/performed Overall Cognitive Status: Within Functional Limits for tasks assessed                                          Exercises Other Exercises Other Exercises: supine AROM with family education    General Comments        Pertinent Vitals/Pain Pain Assessment Pain Assessment: Faces Faces Pain Scale: Hurts even  more Pain Location: knees R hip Pain Descriptors / Indicators: Sore Pain Intervention(s): Limited activity within patient's tolerance, Monitored during session, Premedicated before session, Repositioned    Home Living                          Prior Function            PT Goals (current goals can now be found in the care plan section) Progress towards PT goals: Progressing toward goals    Frequency    7X/week      PT Plan Current plan remains appropriate;Other (comment)    Co-evaluation PT/OT/SLP Co-Evaluation/Treatment: Yes Reason for Co-Treatment: Complexity of the patient's impairments (multi-system involvement) PT goals addressed during session: Mobility/safety with mobility;Strengthening/ROM OT goals addressed during session: Other (comment)      AM-PAC PT "6 Clicks" Mobility   Outcome Measure  Help needed turning from your back to your side while in a flat bed without using bedrails?: A Lot Help needed moving from lying on your back to sitting on the side of a flat bed without using bedrails?: A Lot Help needed moving to and from a bed to a chair (including a wheelchair)?: Total Help needed standing up from a chair using your arms (e.g., wheelchair or bedside chair)?: A Lot Help needed to walk in hospital room?: Total Help needed climbing 3-5 steps with a railing? : Total 6 Click Score: 9    End of Session Equipment Utilized During Treatment: Gait belt Activity Tolerance: Patient tolerated treatment well Patient left: in bed;with call bell/phone within reach;with bed alarm set;with family/visitor present;with nursing/sitter in room Nurse Communication: Mobility status PT Visit Diagnosis: Unsteadiness on feet (R26.81);Muscle weakness (generalized) (M62.81);History of falling (Z91.81);Difficulty in walking, not elsewhere classified (R26.2);Pain Pain - Right/Left: Right Pain - part of body: Knee;Hip     Time: 0133-0212 PT Time Calculation (min)  (ACUTE ONLY): 39 min  Charges:  $Therapeutic Exercise: 8-22 mins $Therapeutic Activity: 23-37 mins                    Chesley Noon, PTA 04/29/21, 2:58 PM

## 2021-04-29 NOTE — Assessment & Plan Note (Addendum)
-   due to fall and anticoagulation - hematoma stable and surrounding ecchymosis - continue xarelto  - Hgb stable as well

## 2021-04-29 NOTE — Progress Notes (Signed)
°  Subjective: 2 Days Post-Op Procedure(s) (LRB): INTRAMEDULLARY (IM) NAIL INTERTROCHANTRIC (Right) STEROID INJECTION TO BILATERAL KNEES (Bilateral) Granddaughter at bedside, acts as interpreter.  Patient reports pain as well-controlled.   Patient is well, and has had no acute complaints or problems Plan is to go Home vs SNF after hospital stay. Negative for chest pain and shortness of breath Fever: no   Objective: Vital signs in last 24 hours: Temp:  [98.1 F (36.7 C)-98.2 F (36.8 C)] 98.2 F (36.8 C) (02/27 0804) Pulse Rate:  [60-90] 60 (02/27 0804) Resp:  [17-18] 18 (02/27 0804) BP: (137-152)/(65-91) 152/91 (02/27 0804) SpO2:  [97 %-99 %] 99 % (02/27 0804)  Intake/Output from previous day:  Intake/Output Summary (Last 24 hours) at 04/29/2021 1054 Last data filed at 04/29/2021 0900 Gross per 24 hour  Intake 480 ml  Output 1350 ml  Net -870 ml    Intake/Output this shift: Total I/O In: 240 [P.O.:240] Out: 400 [Urine:400]  Labs: Recent Labs    04/27/21 0431 04/28/21 0439 04/28/21 1634 04/29/21 0512  HGB 7.0* 9.7* 9.7* 9.8*   Recent Labs    04/28/21 0439 04/28/21 1634 04/29/21 0512  WBC 15.0*  --  17.9*  RBC 3.64*  --  3.70*  HCT 29.7* 30.0* 30.1*  PLT 144*  --  182   Recent Labs    04/28/21 0439 04/29/21 0512  NA 130* 134*  K 4.2 4.3  CL 100 99  CO2 21* 24  BUN 21 28*  CREATININE 0.77 0.66  GLUCOSE 233* 96  CALCIUM 8.2* 8.4*   Recent Labs    04/27/21 0431  INR 1.3*     EXAM General - Patient is Alert, Appropriate, and Oriented Extremity - Neurovascular intact Dorsiflexion/Plantar flexion intact Compartment soft Dressing/Incision -clean, dry, no drainage Motor Function - intact, moving foot and toes well on exam.     Assessment/Plan: 2 Days Post-Op Procedure(s) (LRB): INTRAMEDULLARY (IM) NAIL INTERTROCHANTRIC (Right) STEROID INJECTION TO BILATERAL KNEES (Bilateral) Principal Problem:   Hip fracture (HCC) Active Problems:   Type  2 diabetes mellitus with complication, with long-term current use of insulin (HCC)   Hypothyroidism   Atrial fibrillation (HCC)   Insomnia   Iron deficiency   Anemia   MCI (mild cognitive impairment)   Leukocytosis  Estimated body mass index is 24.75 kg/m as calculated from the following:   Height as of this encounter: 5\' 1"  (1.549 m).   Weight as of this encounter: 59.4 kg. Advance diet Up with therapy  Discharge SNF vs home per PT recommendation. Per the granddaughter, the family wants to be able to stay with the patient if she goes to SNF. Advised her I was not sure what the policy was on this, but that it was not a common scenario.   WBC elevated at 17.9, Hg 9.8  DVT Prophylaxis - Xarelto, Ted hose, and SCDs Weight-Bearing as tolerated to right leg  , PA-C [facility] Orthopaedic Surgery 04/29/2021, 10:54 AM

## 2021-04-29 NOTE — Progress Notes (Signed)
OT Cancellation Note  Patient Details Name: Rubi Tooley MRN: [id number] DOB: 05-Jan-1935   Cancelled Treatment:    Reason Eval/Treat Not Completed: Other (comment). Consult received, chart reviewed. Coordinating with PT, OT will see pt at 1:30pm with family present for pt/family education/training to optimize carryover.   Arman Filter., MPH, MS, OTR/L ascom [phone] 04/29/21, 8:22 AM

## 2021-04-29 NOTE — Anesthesia Postprocedure Evaluation (Signed)
Anesthesia Post Note  Patient: [name]  Procedure(s) Performed: INTRAMEDULLARY (IM) NAIL INTERTROCHANTRIC (Right) STEROID INJECTION TO BILATERAL KNEES (Bilateral)  Patient location during evaluation: PACU Anesthesia Type: General Level of consciousness: awake and alert Pain management: pain level controlled Vital Signs Assessment: post-procedure vital signs reviewed and stable Respiratory status: spontaneous breathing, nonlabored ventilation, respiratory function stable and patient connected to nasal cannula oxygen Cardiovascular status: blood pressure returned to baseline and stable Postop Assessment: no apparent nausea or vomiting Anesthetic complications: no   No notable events documented.   Last Vitals:  Vitals:   04/29/21 0400 04/29/21 0804  BP: (!) 146/66 (!) 152/91  Pulse: 62 60  Resp: 18 18  Temp: 36.8 C 36.8 C  SpO2: 97% 99%    Last Pain:  Vitals:   04/29/21 0822  TempSrc:   PainSc: 0-No pain                 Lenard Simmer

## 2021-04-29 NOTE — Clinical Social Work Note (Cosign Needed)
°  °  Durable Medical Equipment  (From admission, onward)           Start     Ordered   04/29/21 1521  For home use only DME Other see comment  Once       Comments: Michiel Sites lift  Question:  Length of Need  Answer:  Lifetime   04/29/21 1521   04/29/21 1520  For home use only DME Walker rolling  Once       Question Answer Comment  Walker: With 5 Inch Wheels   Patient needs a walker to treat with the following condition Ambulatory dysfunction      04/29/21 1520   04/29/21 1520  For home use only DME Hospital bed  Once       Question Answer Comment  Length of Need Lifetime   The above medical condition requires: Patient requires the ability to reposition frequently   Bed type Semi-electric   Hoyer Lift Yes      04/29/21 1520

## 2021-04-29 NOTE — Progress Notes (Signed)
PROGRESS NOTE  [name] [id number] DOB: [birth year] DOA: 04/25/2021 PCP: Ria Bush, MD  Brief History   [name] is a 87 year old female with past medical history significant for paroxysmal atrial fibrillation on Xarelto, hypothyroidism, essential hypertension, type 2 diabetes mellitus, Hx CVA who presented to [facility] ED on 2/23 following witnessed fall.  Patient was leaving her cardiologist office and her walker locked up causing her to trip and fall.  Patient with significant pain of her right hip.   In the ED, temperature 98.3 F, HR 125, RR 16, BP 111/72, SPO2 99% on room air.  Sodium 134, potassium 3.6, chloride 113, CO2 18, glucose 80, BUN 16, creatinine 0.66.  WBC 13.6, hemoglobin 8.4, platelet 218, INR 2.6.  COVID-19 PCR negative.  Influenza A/B PCR negative.  Chest x-ray with no acute cardiopulmonary disease process.  Right humerus x-ray negative.  Hip x-ray was performed in outpatient clinic that demonstrated a varus impacted/displaced intratrochanteric fracture of the right hip.  Orthopedics was consulted.  TRH consulted for further evaluation and management of hip fracture.  The patient went for ORIF on the right hip earlier today. She has tolerated the procedure well. Prior to her surgery the patient was found to have a hemoglobin of 7.0. 2 units of PRBC's were ordered, but only one was given before surgery. She received the second unit after her surgery.  The patient was found to have a large hematoma of her right upper extremity with a distal large area of ecchymosis. Her xarelto has been held and her hemoglobin is stable. Monitor.  Consultants  Orthopedic surgery  Procedures  ORIF right hip  Antibiotics   Anti-infectives (From admission, onward)    Start     Dose/Rate Route Frequency Ordered Stop   04/27/21 1500  ceFAZolin (ANCEF) IVPB 2g/100 mL premix        2 g 200 mL/hr over 30 Minutes Intravenous Every 6 hours 04/27/21 1243 04/28/21 0417   04/27/21 0803   ceFAZolin (ANCEF) 2-4 GM/100ML-% IVPB       Note to Pharmacy: Mirna Mires: cabinet override      04/27/21 0803 04/27/21 2214   04/27/21 0000  ceFAZolin (ANCEF) IVPB 2g/100 mL premix        2 g 200 mL/hr over 30 Minutes Intravenous 30 min pre-op 04/26/21 0822 04/27/21 0842   04/26/21 0600  ceFAZolin (ANCEF) IVPB 2g/100 mL premix  Status:  Discontinued        2 g 200 mL/hr over 30 Minutes Intravenous 30 min pre-op 04/25/21 1901 04/26/21 [id number]      Subjective  The patient is resting comfortably. No new complaints  Objective   Vitals:  Vitals:   04/29/21 0400 04/29/21 0804  BP: (!) 146/66 (!) 152/91  Pulse: 62 60  Resp: 18 18  Temp: 98.2 F (36.8 C) 98.2 F (36.8 C)  SpO2: 97% 99%    Exam:  Constitutional:  The patient is awake, alert, and oriented x 3. No acute distress. Respiratory:  No increased work of breathing. No wheezes, rales, or rhonchi No tactile fremitus Cardiovascular:  Regular rate and rhythm No murmurs, ectopy, or gallups. No lateral PMI. No thrills. Abdomen:  Abdomen is soft, non-tender, non-distended No hernias, masses, or organomegaly Normoactive bowel sounds.  Musculoskeletal:  No cyanosis, clubbing, or edema There is a large hematoma of the medial aspect of the right upper extremity just distal to the axilla. There is an accompanying large area of ecchymosis just distal to the hematoma. They are not  warm or erythematous and show no signs of infection. Skin:  No rashes, lesions, ulcers palpation of skin: no induration or nodules Ecchymosis of the right upper extremity Neurologic:  CN 2-12 intact Sensation all 4 extremities intact Psychiatric:  Mental status Mood, affect appropriate Orientation to person, place, time  judgment and insight appear intact   I have personally reviewed the following:   Today's Data  Vitals  Lab Data  BMP CBC  Micro Data  None available  Imaging  X-ray right humerus X-ray chest  Cardiology  Data  EKG  Other Data    Scheduled Meds:  sodium chloride   Intravenous Once   atenolol  25 mg Oral Daily   docusate sodium  100 mg Oral BID   feeding supplement (GLUCERNA SHAKE)  237 mL Oral BID BM   ferrous sulfate  325 mg Oral QODAY   insulin aspart  0-5 Units Subcutaneous QHS   insulin aspart  0-9 Units Subcutaneous TID WC   insulin glargine  10 Units Subcutaneous Daily   levothyroxine  75 mcg Oral QAC breakfast   metFORMIN  500 mg Oral Q breakfast   multivitamin with minerals  1 tablet Oral Daily   Ensure Max Protein  11 oz Oral QHS   vitamin B-12  1,000 mcg Oral Q M,W,F   Continuous Infusions:  sodium chloride 75 mL/hr at 04/28/21 0206    Principal Problem:   Hip fracture (HCC) Active Problems:   Type 2 diabetes mellitus with complication, with long-term current use of insulin (HCC)   Hypothyroidism   Atrial fibrillation (HCC)   Insomnia   Iron deficiency   Anemia   MCI (mild cognitive impairment)   Leukocytosis   Hematoma of arm, right, subsequent encounter   LOS: 4 days    A & P  Right hip fracture, POA Patient presenting to the ED following mechanical fall with findings of right intertrochanteric hip fracture. --Orthopedics following, appreciate assistance --Norco 1-2 tablets every 6 hours PRN moderate pain --Morphine 0.5 mg IV q2h PRN severe pain --Fentanyl IV PRN for severe pain not controlled with IV morphine --S/P ORIF of right hip earlier today. She has tolerated the procedure well   Hypertension Paroxysmal atrial fibrillation --Atenolol 25 mg p.o. daily --Holding Xarelto for anticipated surgery tomorrow   Type 2 diabetes mellitus --Semglee 10u [place] daily (daughter reports side effect to any other insulin except lantus; and will bring in home medication for use. --Metformin 500 mg p.o. daily --SSI for coverage --CBGs qAC/HS   Anemia --hgb 8.4>7.7>7.0 --Check anemia panel --Ferrous sulfate 325 mg p.o. every other day --Vitamin B12 1000 mcg  Monday/Wednesday/Friday --The patient has received 2 units PRBC's in transfusion today   Hypothyroidism --Levothyroxine 75 mcg p.o. daily  I have seen and examined this patient myself today. I have spent 34 minutes in her evaluation and care.   DVT prophylaxis: Place TED hose Start: 04/25/21 1857     Code Status: Full Code Family Communication: Daughter is at bedside. All questions answered to the best of my ability   Disposition Plan:  Level of care: Telemetry Medical Status is: Inpatient Remains inpatient appropriate because: Pending surgical intervention for right hip fracture, may need SNF placement, therapy evaluation postoperatively   Hanad Leino, DO [facility] Direct contact: see [email or web address]  7PM-7AM contact night coverage as above 04/29/2021, 2:26 PM  LOS: 3 days

## 2021-04-29 NOTE — Telephone Encounter (Signed)
See subsequent mychart message.

## 2021-04-30 LAB — BASIC METABOLIC PANEL
Anion gap: 8 (ref 5–15)
BUN: 29 mg/dL — ABNORMAL HIGH (ref 8–23)
CO2: 27 mmol/L (ref 22–32)
Calcium: 9.1 mg/dL (ref 8.9–10.3)
Chloride: 100 mmol/L (ref 98–111)
Creatinine, Ser: 0.67 mg/dL (ref 0.44–1.00)
GFR, Estimated: >60 mL/min (ref >60)
Glucose, Bld: 190 mg/dL — ABNORMAL HIGH (ref 70–99)
Potassium: 4.6 mmol/L (ref 3.5–5.1)
Sodium: 135 mmol/L (ref 135–145)

## 2021-04-30 LAB — CBC
HCT: 31.8 % — ABNORMAL LOW (ref 36.0–46.0)
Hemoglobin: 10.2 g/dL — ABNORMAL LOW (ref 12.0–15.0)
MCH: 26.9 pg (ref 26.0–34.0)
MCHC: 32.1 g/dL (ref 30.0–36.0)
MCV: 83.9 fL (ref 80.0–100.0)
Platelets: 201 10*3/uL (ref 150–400)
RBC: 3.79 MIL/uL — ABNORMAL LOW (ref 3.87–5.11)
RDW: 15.9 % — ABNORMAL HIGH (ref 11.5–15.5)
WBC: 16.5 10*3/uL — ABNORMAL HIGH (ref 4.0–10.5)
nRBC: 0 % (ref 0.0–0.2)

## 2021-04-30 LAB — GLUCOSE, CAPILLARY
Glucose-Capillary: 179 mg/dL — ABNORMAL HIGH (ref 70–99)
Glucose-Capillary: 282 mg/dL — ABNORMAL HIGH (ref 70–99)
Glucose-Capillary: 215 mg/dL — ABNORMAL HIGH (ref 70–99)
Glucose-Capillary: 261 mg/dL — ABNORMAL HIGH (ref 70–99)

## 2021-04-30 LAB — FECAL OCCULT BLOOD, IMMUNOCHEMICAL: Fecal Occult Bld: NEGATIVE

## 2021-04-30 NOTE — Telephone Encounter (Signed)
Erroneous encounter

## 2021-04-30 NOTE — Telephone Encounter (Signed)
I did try and call [name] (patient's daughter on the phone today today to explain this, but was unable to reach her.  I left a message for her to check her mychart.  I also spoke with Maudie Mercury at Dr. Nicholaus Bloom office and explained the situation. She is asking Dr. Roland Rack if he can order through the pharmacy and administer while patient is in the hospital.  As she is under his care during the hospitalization, he should prescribe and manage.

## 2021-04-30 NOTE — Progress Notes (Signed)
PROGRESS NOTE  [name] [id number] DOB: 06-Feb-1935 DOA: 04/25/2021 PCP: Ria Bush, MD  Brief History   [name] is a 87 year old female with past medical history significant for paroxysmal atrial fibrillation on Xarelto, hypothyroidism, essential hypertension, type 2 diabetes mellitus, Hx CVA who presented to [facility] ED on 2/23 following witnessed fall.  Patient was leaving her cardiologist office and her walker locked up causing her to trip and fall.  Patient with significant pain of her right hip.   In the ED, temperature 98.3 F, HR 125, RR 16, BP 111/72, SPO2 99% on room air.  Sodium 134, potassium 3.6, chloride 113, CO2 18, glucose 80, BUN 16, creatinine 0.66.  WBC 13.6, hemoglobin 8.4, platelet 218, INR 2.6.  COVID-19 PCR negative.  Influenza A/B PCR negative.  Chest x-ray with no acute cardiopulmonary disease process.  Right humerus x-ray negative.  Hip x-ray was performed in outpatient clinic that demonstrated a varus impacted/displaced intratrochanteric fracture of the right hip.  Orthopedics was consulted.  TRH consulted for further evaluation and management of hip fracture.  The patient went for ORIF on the right hip earlier today. She has tolerated the procedure well. Prior to her surgery the patient was found to have a hemoglobin of 7.0. 2 units of PRBC's were ordered, but only one was given before surgery. She received the second unit after her surgery.  The patient was found to have a large hematoma of her right upper extremity with a distal large area of ecchymosis. Her xarelto has been held and her hemoglobin is stable. Monitor.  The patient is resting comfortably. No new complaints.  Consultants  Orthopedic surgery  Procedures  ORIF right hip  Antibiotics   Anti-infectives (From admission, onward)    Start     Dose/Rate Route Frequency Ordered Stop   04/27/21 1500  ceFAZolin (ANCEF) IVPB 2g/100 mL premix        2 g 200 mL/hr over 30 Minutes Intravenous  Every 6 hours 04/27/21 1243 04/28/21 0417   04/27/21 0803  ceFAZolin (ANCEF) 2-4 GM/100ML-% IVPB       Note to Pharmacy: Mirna Mires: cabinet override      04/27/21 0803 04/27/21 2214   04/27/21 0000  ceFAZolin (ANCEF) IVPB 2g/100 mL premix        2 g 200 mL/hr over 30 Minutes Intravenous 30 min pre-op 04/26/21 0822 04/27/21 0842   04/26/21 0600  ceFAZolin (ANCEF) IVPB 2g/100 mL premix  Status:  Discontinued        2 g 200 mL/hr over 30 Minutes Intravenous 30 min pre-op 04/25/21 1901 04/26/21 [id number]      Subjective  The patient is resting comfortably. No new complaints  Objective   Vitals:  Vitals:   04/30/21 0330 04/30/21 0802  BP: (!) 173/94 (!) 153/87  Pulse: 69 91  Resp: 18 18  Temp: 98.2 F (36.8 C) 98 F (36.7 C)  SpO2: 100% 100%    Exam:  Constitutional:  The patient is awake, alert, and oriented x 3. No acute distress. Respiratory:  No increased work of breathing. No wheezes, rales, or rhonchi No tactile fremitus Cardiovascular:  Regular rate and rhythm No murmurs, ectopy, or gallups. No lateral PMI. No thrills. Abdomen:  Abdomen is soft, non-tender, non-distended No hernias, masses, or organomegaly Normoactive bowel sounds.  Musculoskeletal:  No cyanosis, clubbing, or edema There is a large hematoma of the medial aspect of the right upper extremity just distal to the axilla. There is an accompanying large area of  ecchymosis just distal to the hematoma. They are not warm or erythematous and show no signs of infection. Skin:  No rashes, lesions, ulcers palpation of skin: no induration or nodules Ecchymosis of the right upper extremity Neurologic:  CN 2-12 intact Sensation all 4 extremities intact Psychiatric:  Mental status Mood, affect appropriate Orientation to person, place, time  judgment and insight appear intact   I have personally reviewed the following:   Today's Data  Vitals  Lab Data  BMP CBC  Micro Data  None  available  Imaging  X-ray right humerus X-ray chest  Cardiology Data  EKG  Other Data    Scheduled Meds:  sodium chloride   Intravenous Once   atenolol  25 mg Oral Daily   docusate sodium  100 mg Oral BID   feeding supplement (GLUCERNA SHAKE)  237 mL Oral BID BM   ferrous sulfate  325 mg Oral QODAY   insulin aspart  0-5 Units Subcutaneous QHS   insulin aspart  0-9 Units Subcutaneous TID WC   insulin glargine  10 Units Subcutaneous Daily   levothyroxine  75 mcg Oral QAC breakfast   metFORMIN  500 mg Oral Q breakfast   multivitamin with minerals  1 tablet Oral Daily   Ensure Max Protein  11 oz Oral QHS   vitamin B-12  1,000 mcg Oral Q M,W,F   Continuous Infusions:  sodium chloride 75 mL/hr at 04/28/21 0206    Principal Problem:   Hip fracture (HCC) Active Problems:   Type 2 diabetes mellitus with complication, with long-term current use of insulin (HCC)   Hypothyroidism   Atrial fibrillation (HCC)   Insomnia   Iron deficiency   Anemia   MCI (mild cognitive impairment)   Leukocytosis   Hematoma of arm, right, subsequent encounter   LOS: 5 days    A & P  Right hip fracture, POA Patient presenting to the ED following mechanical fall with findings of right intertrochanteric hip fracture. --Orthopedics following, appreciate assistance --Norco 1-2 tablets every 6 hours PRN moderate pain --Morphine 0.5 mg IV q2h PRN severe pain --Fentanyl IV PRN for severe pain not controlled with IV morphine --S/P ORIF of right hip earlier today. She has tolerated the procedure well   Hypertension Paroxysmal atrial fibrillation --Atenolol 25 mg p.o. daily --Holding Xarelto for anticipated surgery tomorrow   Type 2 diabetes mellitus --Semglee 10u [place] daily (daughter reports side effect to any other insulin except lantus; and will bring in home medication for use. --Metformin 500 mg p.o. daily --SSI for coverage --CBGs qAC/HS   Anemia --hgb 8.4>7.7>7.0 --Check anemia  panel --Ferrous sulfate 325 mg p.o. every other day --Vitamin B12 1000 mcg Monday/Wednesday/Friday --The patient has received 2 units PRBC's in transfusion today   Hypothyroidism --Levothyroxine 75 mcg p.o. daily  I have seen and examined this patient myself today. I have spent 34 minutes in her evaluation and care.   DVT prophylaxis: Place TED hose Start: 04/25/21 1857     Code Status: Full Code Family Communication: Daughter is at bedside. All questions answered to the best of my ability   Disposition Plan:  Level of care: Home with home health   Dare Spillman, DO [facility] Direct contact: see [email or web address]  7PM-7AM contact night coverage as above 04/30/2021, 2:26 PM  LOS: 3 days

## 2021-04-30 NOTE — TOC Progression Note (Signed)
Transition of Care [facility]) - Progression Note    Patient Details  Name: Kimaria Defrance MRN: [id number] Date of Birth: 1934/07/13  Transition of Care [facility]) CM/SW Contact  Eileen Stanford, LCSW Phone Number: 04/30/2021, 3:56 PM  Clinical Narrative:   CSW has received multiple calls from pt's daughter. Pt's daughter is concerned for bed and stating she needs a electric not a manual. CSW has contacted both Danielle with [employer] and Thedore Mins to please assist pt's daughter.   Pt's daughter called and states the mattress is terrible and its a old model bed. Pt daughter now interest in using a different DME company. However, CSW explained it is very likely that she is going to get the same model from a alternative company. CSW told pt's daughter that Thedore Mins with [employer] was going to call her and for her to express her mattress concerns and see if there was anyway they could fix that, if not we will proceed with alternative company.         Expected Discharge Plan and Services                                                 Social Determinants of Health (SDOH) Interventions    Readmission Risk Interventions No flowsheet data found.

## 2021-04-30 NOTE — Progress Notes (Signed)
Physical Therapy Treatment Patient Details Name: [name] MRN: [id number] DOB: 19-Sep-1934 Today's Date: 04/30/2021   History of Present Illness Pt admitted 04/25/21 for mechanical fall that resulted in R hip fx, s/p ORIF with TFN nail 2/25 by Dr. Joice Lofts. Reports hurting R shoulder during fall; imaging negative for acute fracture. Significant PMH includes: Afib on anticoagulation, HTN, hepatitis, hypothyroidism, and hx of CVA.    PT Comments    Daughter present for scheduled family training session. Daughter able to assist patient with sit/stand (x3), mod assist +2, with therapist assisting as second person; good carry-over of education from previous date.  Patient, however, unable to progress towards any semblance of stepping for SPT.  Reviewed alternative transfer methods--scoot pivot over level surfaces (patient able to complete with max assist +1 from therapist; max assist +2 with daughter) and use of mechnical hoyer lift.  Patient/daughter do not appear comfortable or safe with scoot pivot transfer at this time (though worth continuinig education for progession to this technique); recommend use of hoyer lift for all transfers within the home at this time.  Did review placement of sling (from both bed and chair position), management of lift; demonstrated transfer to/from manual WC.  Daughter in agreement for use of lift, but will require additional training session to ensure safe and confident use of equipment outside of hospital environment.  *Daughter did offer additional insight into home environment--two level home with 3 steps to enter; full flight to upper level of home (where patient's bedroom is located).  Does have a chair lift over stairs, but will need to be able to safely transfer to/from for use upon discharge.  Recommend consideration of ramp installation for entry/exit of home; may require EMS transport immediately upon discharge.  Also recommend consideration of hospital bed/BSC  set up on main level of home until patient more safely, confidently completes transfers between environments (otherwise, may require lift on both levels of home).  TOC/MD informed/aware of the above.    Recommendations for follow up therapy are one component of a multi-disciplinary discharge planning process, led by the attending physician.  Recommendations may be updated based on patient status, additional functional criteria and insurance authorization.  Follow Up Recommendations  Skilled nursing-short term rehab (<3 hours/day) (appears family preference is for [facility] services)     Assistance Recommended at Discharge Frequent or constant Supervision/Assistance  Patient can return home with the following Two people to help with walking and/or transfers;A lot of help with bathing/dressing/bathroom;Assistance with cooking/housework;Assist for transportation;Help with stairs or ramp for entrance   Equipment Recommendations  Rolling walker (2 wheels);BSC/3in1;Hospital bed;Wheelchair (measurements PT);Wheelchair cushion (measurements PT);Other (comment) (hoyer lift)    Recommendations for Other Services       Precautions / Restrictions Precautions Precautions: Fall Precaution Comments: B knee arthritis Restrictions Weight Bearing Restrictions: Yes RLE Weight Bearing: Weight bearing as tolerated     Mobility  Bed Mobility Overal bed mobility: Needs Assistance Bed Mobility: Supine to Sit, Sit to Supine Rolling: Mod assist, Max assist   Supine to sit: Max assist, Total assist          Transfers Overall transfer level: Needs assistance Equipment used: Rolling walker (2 wheels) Transfers: Sit to/from Stand Sit to Stand: Mod assist, +2 physical assistance           General transfer comment: caregiver training/education throughout for sequencing, assist, optimal positioning    Ambulation/Gait               General Gait Details:  unable to [employer] and advance  LEs   Stairs             Wheelchair Mobility    Modified Rankin (Stroke Patients Only)       Balance Overall balance assessment: Needs assistance Sitting-balance support: No upper extremity supported, Feet supported Sitting balance-Leahy Scale: Fair Sitting balance - Comments: lists posteriorally with fatigue   Standing balance support: Bilateral upper extremity supported, During functional activity Standing balance-Leahy Scale: Poor Standing balance comment: +1-2 to safely maintain balance                            Cognition Arousal/Alertness: Awake/alert Behavior During Therapy: WFL for tasks assessed/performed Overall Cognitive Status: Within Functional Limits for tasks assessed                                 General Comments: daughter refusing interpreter.        Exercises Other Exercises Other Exercises: Extensive education with daughter during session--good return demonstration of technique for sit/stand (with therapist as second person) from previous date.  Patient, however, unable to progress towards any semblance of stepping to facilitate SPT between seating surfaces. Other Exercises: Reviewed alternative transfer methods--scoot pivot over level surfaces (patient able to complete with max assist +1 from therapist; max assist +2 with daughter) and use of mechnical hoyer lift.  Patient/daughter do not appear comfortable or safe with scoot pivot transfer at this time (though worth continuinig education for progession to this technique); recommend use of hoyer lift for all transfers within the home at this time. Other Exercises: Did review placement of sling (from both bed and chair position), management of lift; demonstrated transfer to/from manual WC.  Daughter in agreement for use of lift, but will require additional training session to ensure safe and confident use of equipment outside of hospital environment. Other Exercises: Daughter  did offer additional insight into home environment--two level home with 3 steps to enter; full flight to upper level of home (where patient's bedroom is located).  Does have a chair lift over stairs, but will need to be able to safely transfer to/from for use upon discharge.  Recommend consideration of ramp installation for entry/exit of home; may require EMS transport immediately upon discharge.  Also recommend consideration of hospital bed/BSC set up on main level of home until patient more safely, confidently completes transfers between environments (otherwise, may require lift on both levels of home).  TOC/MD informed/aware of the above.    General Comments        Pertinent Vitals/Pain Pain Assessment Pain Assessment: Faces Faces Pain Scale: Hurts even more Pain Location: knees R hip Pain Descriptors / Indicators: Sore, Grimacing, Guarding Pain Intervention(s): Monitored during session, Premedicated before session, Repositioned, Limited activity within patient's tolerance    Home Living                          Prior Function            PT Goals (current goals can now be found in the care plan section) Acute Rehab PT Goals Patient Stated Goal: "figure out why she fell" PT Goal Formulation: With family Time For Goal Achievement: 05/11/21 Potential to Achieve Goals: Good Progress towards PT goals: Progressing toward goals    Frequency    7X/week      PT Plan Current  plan remains appropriate;Other (comment)    Co-evaluation              AM-PAC PT "6 Clicks" Mobility   Outcome Measure  Help needed turning from your back to your side while in a flat bed without using bedrails?: A Lot Help needed moving from lying on your back to sitting on the side of a flat bed without using bedrails?: A Lot Help needed moving to and from a bed to a chair (including a wheelchair)?: Total Help needed standing up from a chair using your arms (e.g., wheelchair or bedside  chair)?: A Lot Help needed to walk in hospital room?: Total Help needed climbing 3-5 steps with a railing? : Total 6 Click Score: 9    End of Session Equipment Utilized During Treatment: Gait belt Activity Tolerance: Patient tolerated treatment well Patient left: in bed;with call bell/phone within reach;with bed alarm set;with family/visitor present Nurse Communication: Mobility status PT Visit Diagnosis: Unsteadiness on feet (R26.81);Muscle weakness (generalized) (M62.81);History of falling (Z91.81);Difficulty in walking, not elsewhere classified (R26.2);Pain Pain - Right/Left: Right Pain - part of body: Knee;Hip     Time: 1100-1203 PT Time Calculation (min) (ACUTE ONLY): 63 min  Charges:  $Therapeutic Activity: 53-67 mins                     Renate Danh H. Owens Shark, PT, DPT, NCS 04/30/21, 2:27 PM [phone]

## 2021-04-30 NOTE — Progress Notes (Signed)
°  Subjective: 3 Days Post-Op Procedure(s) (LRB): INTRAMEDULLARY (IM) NAIL INTERTROCHANTRIC (Right) STEROID INJECTION TO BILATERAL KNEES (Bilateral) Daughters at bedside, acts as Equities trader.  Patient reports pain as well-controlled.   Patient is well, and has had no acute complaints or problems Plan is to go Home with HHPT after hospital stay. Negative for chest pain and shortness of breath Fever: no recent fevers but WBC up to 17.9 yesteray.   Objective: Vital signs in last 24 hours: Temp:  [97.9 F (36.6 C)-98.2 F (36.8 C)] 98 F (36.7 C) (02/28 0802) Pulse Rate:  [69-91] 91 (02/28 0802) Resp:  [16-18] 18 (02/28 0802) BP: (153-173)/(52-94) 153/87 (02/28 0802) SpO2:  [91 %-100 %] 100 % (02/28 0802)  Intake/Output from previous day:  Intake/Output Summary (Last 24 hours) at 04/30/2021 1326 Last data filed at 04/30/2021 0900 Gross per 24 hour  Intake --  Output 2350 ml  Net -2350 ml    Intake/Output this shift: Total I/O In: -  Out: 600 [Urine:600]  Labs: Recent Labs    04/28/21 0439 04/28/21 1634 04/29/21 0512  HGB 9.7* 9.7* 9.8*   Recent Labs    04/28/21 0439 04/28/21 1634 04/29/21 0512  WBC 15.0*  --  17.9*  RBC 3.64*  --  3.70*  HCT 29.7* 30.0* 30.1*  PLT 144*  --  182   Recent Labs    04/29/21 0512 04/30/21 0610  NA 134* 135  K 4.3 4.6  CL 99 100  CO2 24 27  BUN 28* 29*  CREATININE 0.66 0.67  GLUCOSE 209* 190*  CALCIUM 8.4* 9.1   No results for input(s): LABPT, INR in the last 72 hours.  EXAM General - Patient is Alert, Appropriate, and Oriented Extremity - ABD soft Neurovascular intact Dorsiflexion/Plantar flexion intact Compartment soft Dressing/Incision -clean, dry, no drainage Motor Function - intact, moving foot and toes well on exam.  No signs of infection to the right leg.  Assessment/Plan: 3 Days Post-Op Procedure(s) (LRB): INTRAMEDULLARY (IM) NAIL INTERTROCHANTRIC (Right) STEROID INJECTION TO BILATERAL KNEES  (Bilateral) Principal Problem:   Hip fracture (HCC) Active Problems:   Type 2 diabetes mellitus with complication, with long-term current use of insulin (HCC)   Hypothyroidism   Atrial fibrillation (HCC)   Insomnia   Iron deficiency   Anemia   MCI (mild cognitive impairment)   Leukocytosis   Hematoma of arm, right, subsequent encounter  Estimated body mass index is 24.75 kg/m as calculated from the following:   Height as of this encounter: 5\' 1"  (1.549 m).   Weight as of this encounter: 59.4 kg. Advance diet Up with therapy  WBC up to 17.9 yesterday, not available today.  CBC has been ordered for today.  Patient denies any chest pain or SOB.   Will obtain CXR and UA for further evaluation of elevated WBC if it remains elevated today. Dr. has ordered knee braces for the patient for added stability given her significant knee osteoarthritis. PCP had order gel injections for the patient.  Trying to find a way to be able to give the injections to the patient while she is admitted to the hospital.  DVT Prophylaxis - Xarelto, Ted hose, and SCDs Weight-Bearing as tolerated to right leg  J. Joice Lofts, PA-C [facility] 04/30/2021, 1:26 PM

## 2021-05-01 ENCOUNTER — Inpatient Hospital Stay: Payer: 59

## 2021-05-01 DIAGNOSIS — S72001A Fracture of unspecified part of neck of right femur, initial encounter for closed fracture: Secondary | ICD-10-CM

## 2021-05-01 LAB — CBC WITH DIFFERENTIAL/PLATELET
Abs Immature Granulocytes: 0.11 10*3/uL — ABNORMAL HIGH (ref 0.00–0.07)
Basophils Absolute: 0 10*3/uL (ref 0.0–0.1)
Basophils Relative: 0 %
Eosinophils Absolute: 0 10*3/uL (ref 0.0–0.5)
Eosinophils Relative: 0 %
HCT: 32.8 % — ABNORMAL LOW (ref 36.0–46.0)
Hemoglobin: 10.4 g/dL — ABNORMAL LOW (ref 12.0–15.0)
Immature Granulocytes: 1 %
Lymphocytes Relative: 7 %
Lymphs Abs: 1.1 10*3/uL (ref 0.7–4.0)
MCH: 26.7 pg (ref 26.0–34.0)
MCHC: 31.7 g/dL (ref 30.0–36.0)
MCV: 84.3 fL (ref 80.0–100.0)
Monocytes Absolute: 0.9 10*3/uL (ref 0.1–1.0)
Monocytes Relative: 6 %
Neutro Abs: 13.7 10*3/uL — ABNORMAL HIGH (ref 1.7–7.7)
Neutrophils Relative %: 86 %
Platelets: 213 10*3/uL (ref 150–400)
RBC: 3.89 MIL/uL (ref 3.87–5.11)
RDW: 16 % — ABNORMAL HIGH (ref 11.5–15.5)
WBC: 15.8 10*3/uL — ABNORMAL HIGH (ref 4.0–10.5)
nRBC: 0.1 % (ref 0.0–0.2)

## 2021-05-01 LAB — GLUCOSE, CAPILLARY
Glucose-Capillary: 149 mg/dL — ABNORMAL HIGH (ref 70–99)
Glucose-Capillary: 169 mg/dL — ABNORMAL HIGH (ref 70–99)
Glucose-Capillary: 231 mg/dL — ABNORMAL HIGH (ref 70–99)
Glucose-Capillary: 235 mg/dL — ABNORMAL HIGH (ref 70–99)
Glucose-Capillary: 198 mg/dL — ABNORMAL HIGH (ref 70–99)

## 2021-05-01 LAB — URINALYSIS, COMPLETE (UACMP) WITH MICROSCOPIC
Bacteria, UA: NONE SEEN
Bilirubin Urine: NEGATIVE
Glucose, UA: NEGATIVE mg/dL
Hgb urine dipstick: NEGATIVE
Ketones, ur: NEGATIVE mg/dL
Leukocytes,Ua: NEGATIVE
Nitrite: NEGATIVE
Protein, ur: NEGATIVE mg/dL
Specific Gravity, Urine: 1.023 (ref 1.005–1.030)
pH: 6 (ref 5.0–8.0)

## 2021-05-01 LAB — BASIC METABOLIC PANEL
Anion gap: 4 — ABNORMAL LOW (ref 5–15)
BUN: 27 mg/dL — ABNORMAL HIGH (ref 8–23)
CO2: 27 mmol/L (ref 22–32)
Calcium: 8.4 mg/dL — ABNORMAL LOW (ref 8.9–10.3)
Chloride: 101 mmol/L (ref 98–111)
Creatinine, Ser: 0.59 mg/dL (ref 0.44–1.00)
GFR, Estimated: >60 mL/min (ref >60)
Glucose, Bld: 159 mg/dL — ABNORMAL HIGH (ref 70–99)
Potassium: 4.7 mmol/L (ref 3.5–5.1)
Sodium: 132 mmol/L — ABNORMAL LOW (ref 135–145)

## 2021-05-01 MED ORDER — RIVAROXABAN 15 MG PO TABS
15.0000 mg | ORAL_TABLET | Freq: Every day | ORAL | Status: DC
  Administered 2021-05-01 – 2021-05-02 (×2): 15 mg via ORAL
  Filled 2021-05-01 (×2): qty 1

## 2021-05-01 NOTE — TOC Progression Note (Signed)
Transition of Care (TOC) - Progression Note  ? ? ?Patient Details  ?Name: [name] ?MRN: [id number] ?Date of Birth: [birth year] ? ?Transition of Care (TOC) CM/SW Contact  ?Saffron Busey A Myshawn Chiriboga, LCSW ?Phone Number: ?05/01/2021, 9:43 AM ? ?Clinical Narrative:   CSW called pt's daughter-left vmail. ? ? ? ?  ?  ? ?Expected Discharge Plan and Services ?  ?  ?  ?  ?  ?                ?  ?  ?  ?  ?  ?  ?  ?  ?  ?  ? ? ?Social Determinants of Health (SDOH) Interventions ?  ? ?Readmission Risk Interventions ?No flowsheet data found. ? ?

## 2021-05-01 NOTE — Progress Notes (Signed)
Physical Therapy Treatment Patient Details Name: [name] MRN: [id number] DOB: May 15, 1934 Today's Date: 05/01/2021   History of Present Illness Pt admitted 04/25/21 for mechanical fall that resulted in R hip fx, s/p ORIF with TFN nail 2/25 by Dr. Joice Lofts. Reports hurting R shoulder during fall; imaging negative for acute fracture. Significant PMH includes: Afib on anticoagulation, HTN, hepatitis, hypothyroidism, and hx of CVA.    PT Comments    Continued family training with daughters to prepare for safe discharge home.  Daughters able to return demonstration of safe use of hoyer lift and sit/stand, static standing with RW (mod/max assist +2)--min cuing from therapist at times.  Patient does tolerate well, and responds positively to daughters. Reviewed use of equipment (hoyer, manual WC, RW); recommend use of manual WC as primary mobility with use of hoyer for all basic transfers. Advise sit/stand be performed for hygiene, clothing management and WBing purposes only at this time; unsafe for standing/gait efforts outside of therapy.  Family voiced understanding and awareness. As daughter, [name], continues to strongly prefer transition home (versus STR), did discuss possible need to have temporary bedroom set up on main level of home (currently on second level, chair lift to access).  Daughter somewhat resistant to that; hopeful that EMS will transport patient to second level of home and that all care can be provided upstairs ("she will stay there until she has to come down"). Family with additional questions about equipment-unhappy with hospital bed received; directed to [facility] department to discuss additional equipment options/availability.    Recommendations for follow up therapy are one component of a multi-disciplinary discharge planning process, led by the attending physician.  Recommendations may be updated based on patient status, additional functional criteria and insurance  authorization.  Follow Up Recommendations  Skilled nursing-short term rehab (<3 hours/day)     Assistance Recommended at Discharge Frequent or constant Supervision/Assistance  Patient can return home with the following Two people to help with walking and/or transfers;A lot of help with bathing/dressing/bathroom;Assistance with cooking/housework;Assist for transportation;Help with stairs or ramp for entrance   Equipment Recommendations  Rolling walker (2 wheels);BSC/3in1;Hospital bed;Wheelchair (measurements PT);Wheelchair cushion (measurements PT);Other (comment)    Recommendations for Other Services       Precautions / Restrictions Precautions Precautions: Fall Precaution Comments: B knee arthritis Restrictions Weight Bearing Restrictions: Yes RLE Weight Bearing: Weight bearing as tolerated     Mobility  Bed Mobility Overal bed mobility: Needs Assistance Bed Mobility: Rolling Rolling: Mod assist         General bed mobility comments: step by step cuing for rolling bilat, heavy use of bedrails; assist from therapist to position LEs in hook-lying position    Transfers Overall transfer level: Needs assistance   Transfers: Sit to/from Stand Sit to Stand: Mod assist, +2 physical assistance           General transfer comment: continued caregiver training with two daughters for sit/stand, transfers    Ambulation/Gait               General Gait Details: unable to [employer] and advance LEs   Stairs             Wheelchair Mobility    Modified Rankin (Stroke Patients Only)       Balance           Standing balance support: Bilateral upper extremity supported Standing balance-Leahy Scale: Poor Standing balance comment: +2 to safely maintain balance  Cognition Arousal/Alertness: Awake/alert Behavior During Therapy: WFL for tasks assessed/performed Overall Cognitive Status: Within Functional Limits for  tasks assessed                                 General Comments: daughter refusing interpreter.        Exercises Other Exercises Other Exercises: Continued training with daughters for care management in home environment.  Able to return demonstration of use of hoyer lift for bed/WC transfer with min cuing from therapist; fair carry-over from previous date.  Placed sling on patient while supine in bed; mod assist for rolling.  Patient tolerated movement and use of lift well.  Daughter does report manual hoyer lift delivered to home previous date and feels comfortable with use (company reviewed upon delivery; also practiced with her son at home). Other Exercises: Daughters also demonstrated guarding/assist for sit/stand transfer with RW (performed from manual WC), mod/max assist +2.  Good hand placement, cuing and assist for patient; no physical assist from therapist required.  Patient remains unable to unweight/advance LEs in standing; unsafe to attempt SPT or gait.  Continue to recommend sit/stand for hygiene, clothing management, LE WBing purposes only; hoyer lift for all transfers to optimize safety. Other Exercises: Seated LE therex, 2x8, act/act assist ROM to bilat LEs.  Gradual improvement in isolated control and use of R LE with seated therex. Other Exercises: Did discuss, at length, option of STR as bridge between hospital and home to optimize patient's functional indep, decrease caregiver burden prior to return home with family; would also allow additional time for home/equipment set up.  Daughter, [name], continues to prefer and insist on discharge home with family support, [facility] services and hired caregivers as needed. Other Exercises: Finally, reviewed noted order for outside vendor referral to evaluate for bilat KAFOs.  Discussed with orthopedist (Poggi)--given current functional status (WC level, hoyer lift for transfers), KAFOs not appropriate for patient at this stage in  recovery.  Will hold on formal evaluation at this time, but will provide patient/family with referral and vendor contact information for use as recovery progresses and patient better able to tolerate WBing, standing and gait activities.  Patient/family aware and in agreement.    General Comments        Pertinent Vitals/Pain Pain Assessment Pain Assessment: Faces Faces Pain Scale: Hurts even more Pain Location: knees R hip Pain Descriptors / Indicators: Sore, Grimacing, Guarding Pain Intervention(s): Limited activity within patient's tolerance, Monitored during session, Premedicated before session, Repositioned    Home Living                          Prior Function            PT Goals (current goals can now be found in the care plan section) Acute Rehab PT Goals Patient Stated Goal: "figure out why she fell" PT Goal Formulation: With family Time For Goal Achievement: 05/11/21 Potential to Achieve Goals: Good Progress towards PT goals: Progressing toward goals    Frequency    7X/week      PT Plan Current plan remains appropriate    Co-evaluation              AM-PAC PT "6 Clicks" Mobility   Outcome Measure  Help needed turning from your back to your side while in a flat bed without using bedrails?: A Lot Help needed moving from lying on your  back to sitting on the side of a flat bed without using bedrails?: A Lot Help needed moving to and from a bed to a chair (including a wheelchair)?: Total Help needed standing up from a chair using your arms (e.g., wheelchair or bedside chair)?: A Lot Help needed to walk in hospital room?: Total Help needed climbing 3-5 steps with a railing? : Total 6 Click Score: 9    End of Session Equipment Utilized During Treatment: Gait belt Activity Tolerance: Patient tolerated treatment well Patient left:  (seated in manual WC; daughters present at bedside.  RN informed/aware of position. Family to call for assist when  patient ready for return to bed) Nurse Communication: Mobility status PT Visit Diagnosis: Unsteadiness on feet (R26.81);Muscle weakness (generalized) (M62.81);History of falling (Z91.81);Difficulty in walking, not elsewhere classified (R26.2);Pain Pain - Right/Left: Right Pain - part of body: Knee;Hip     Time: 1107-1229 PT Time Calculation (min) (ACUTE ONLY): 82 min  Charges:  $Therapeutic Activity: 68-82 mins                    Jacia Sickman H. Manson Passey, PT, DPT, NCS 05/01/21, 2:03 PM [phone]

## 2021-05-01 NOTE — Assessment & Plan Note (Addendum)
-   s/p mechanical fall likely from severe arthritis in bilateral knees, causing buckling and falling ?- Underwent ORIF with orthopedic surgery on 04/27/2021 ?-Patient hesitant to work aggressively with PT due to pain, weakness, and fear of falling again.  Family still wishes to take patient home with home health and is declining SNF ?-No noted neurodeficits on my evaluation including any dysmetria or cerebellar signs (gait deferred) however family extremely concerned over ruling out recurrent stroke contributing to her reason for falling. They have discussed this with outpatient neurology as well ?-MRI brain performed on 05/01/2021 is negative for acute stroke.  Does show prior known right insula/temporal lobe infarct and also diffuse atrophy and chronic small vessel ischemia.  Findings discussed with her son bedside prior to discharge ?

## 2021-05-01 NOTE — TOC Progression Note (Addendum)
Transition of Care (TOC) - Progression Note  ? ? ?Patient Details  ?Name: [name] ?MRN: [id number] ?Date of Birth: 1934/06/28 ? ?Transition of Care (TOC) CM/SW Contact  ?Orva Riles A Valen Gillison, LCSW ?Phone Number: ?05/01/2021, 10:43 AM ? ?Clinical Narrative:    ?CSW met with pt's other daughter at bedside to let her know there were things taking place in the background to get the pt set up for dc home. Pt's daughter states the other daughter [name] is heading this. Pt's daughter and pt both very appreciative. RN also aware.  ? ?CSW has secured HH with [facility]. CSW received a call from [place] with [facility] and she states they are no longer able to accept pt as the insurance came back as [employer], out of network with no out of network benefits. Malachy Mood states she is going to contact the [facility] agencies she knows takes [employer] and will update CSW. ?  ?  ? ?Expected Discharge Plan and Services ?  ?  ?  ?  ?  ?                ?  ?  ?  ?  ?  ?  ?  ?  ?  ?  ? ? ?Social Determinants of Health (SDOH) Interventions ?  ? ?Readmission Risk Interventions ?No flowsheet data found. ? ?

## 2021-05-01 NOTE — TOC Progression Note (Signed)
Transition of Care (TOC) - Progression Note  ? ? ?Patient Details  ?Name: [name] ?MRN: [id number] ?Date of Birth: 12/13/1934 ? ?Transition of Care (TOC) CM/SW Contact  ?Vasilis Luhman A [place], LCSW ?Phone Number: ?05/01/2021, 3:23 PM ? ?Clinical Narrative:   [facility] will service at d/c. Pt's daughter is going to try to find the exact bed she wants pt to have in the meantime she wants a fully automatic bed from [employer] with a better mattress. Zach with [employer] is aware. CSW will notify MD of change of order. ? ? ? ?  ?  ? ?Expected Discharge Plan and Services ?  ?  ?  ?  ?  ?                ?  ?  ?  ?  ?  ?  ?  ?  ?  ?  ? ? ?Social Determinants of Health (SDOH) Interventions ?  ? ?Readmission Risk Interventions ?No flowsheet data found. ? ?

## 2021-05-01 NOTE — Hospital Course (Addendum)
[name] is an 87 year old female with PMH PAF on Xarelto, hypothyroidism, HTN, DMII, Hx CVA who presented after a fall.  Her family member had witnessed the fall and from description it sounds like she had pain associated with arthritis in her right knee causing it to give out and the patient then falling down onto her right side.  Her family endorsed that she has had significant pain in her knees chronically.  ?Work-up was notable for a displaced intertrochanteric fracture of the right hip.  She was evaluated by orthopedic surgery and underwent ORIF on 04/27/2021. ?See below for further problem-based plan. ?

## 2021-05-01 NOTE — Progress Notes (Signed)
?  Subjective: ?4 Days Post-Op Procedure(s) (LRB): ?INTRAMEDULLARY (IM) NAIL INTERTROCHANTRIC (Right) ?STEROID INJECTION TO BILATERAL KNEES (Bilateral) ?Daughters not at bedside this afternoon. ?Patient denies any pain currently. ?Patient is well, and has had no acute complaints or problems ?Plan is to go Home with HHPT after hospital stay.  Arrangements are being made for home health therapy. ?Negative for chest pain and shortness of breath ?Fever: no recent fevers ? ?Objective: ?Vital signs in last 24 hours: ?Temp:  [97.5 ?F (36.4 ?C)-98.8 ?F (37.1 ?C)] 98.7 ?F (37.1 ?C) (03/01 1604) ?Pulse Rate:  [53-92] 92 (03/01 1604) ?Resp:  [18-20] 18 (03/01 1604) ?BP: (136-162)/(62-117) 137/72 (03/01 1604) ?SpO2:  [98 %-100 %] 100 % (03/01 1604) ? ?Intake/Output from previous day: ? ?Intake/Output Summary (Last 24 hours) at 05/01/2021 1618 ?Last data filed at 05/01/2021 1033 ?Gross per 24 hour  ?Intake 420 ml  ?Output 500 ml  ?Net -80 ml  ?  ?Intake/Output this shift: ?Total I/O ?In: 120 [P.O.:120] ?Out: -  ? ?Labs: ?Recent Labs  ?  04/28/21 ?1634 04/29/21 ?9622 04/30/21 ?1416 05/01/21 ?0320  ?HGB 9.7* 9.8* 10.2* 10.4*  ? ?Recent Labs  ?  04/30/21 ?1416 05/01/21 ?0320  ?WBC 16.5* 15.8*  ?RBC 3.79* 3.89  ?HCT 31.8* 32.8*  ?PLT 201 213  ? ?Recent Labs  ?  04/30/21 ?0610 05/01/21 ?0320  ?NA 135 132*  ?K 4.6 4.7  ?CL 100 101  ?CO2 27 27  ?BUN 29* 27*  ?CREATININE 0.67 0.59  ?GLUCOSE 190* 159*  ?CALCIUM 9.1 8.4*  ? ?No results for input(s): LABPT, INR in the last 72 hours. ? ?EXAM ?General - Patient is Alert, Appropriate, and Oriented ?Extremity - ABD soft ?Neurovascular intact ?Dorsiflexion/Plantar flexion intact ?Compartment soft ?Dressing/Incision -clean, dry, no drainage ?Motor Function - intact, moving foot and toes well on exam.  ?No signs of infection to the right leg. ? ?Assessment/Plan: ?4 Days Post-Op Procedure(s) (LRB): ?INTRAMEDULLARY (IM) NAIL INTERTROCHANTRIC (Right) ?STEROID INJECTION TO BILATERAL KNEES  (Bilateral) ?Principal Problem: ?  Closed right hip fracture (HCC) ?Active Problems: ?  Type 2 diabetes mellitus with complication, with long-term current use of insulin (HCC) ?  Hypothyroidism ?  Atrial fibrillation (HCC) ?  Insomnia ?  Iron deficiency ?  Anemia ?  MCI (mild cognitive impairment) ?  Physical deconditioning ?  Leukocytosis ?  Hematoma of arm, right, subsequent encounter ? ?Estimated body mass index is 24.75 kg/m? as calculated from the following: ?  Height as of this encounter: 5\' 1"  (1.549 m). ?  Weight as of this encounter: 59.4 kg. ?Advance diet ?Up with therapy ? ?WBC up to trending down to 15.8 today, I have ordered a UA for further evaluation. ?Dr. has ordered knee braces for the patient for added stability given her significant knee osteoarthritis.  Will obtain these as an outpatient at this time. ?Gel injections have been obtained for the patient.  Will plan on performing these prior to her discharge home.  Family is not at bedside to explain the injections right now, will plan on performing these tomorrow for her. ? ?DVT Prophylaxis - Xarelto, Ted hose, and SCDs ?Weight-Bearing as tolerated to right leg ? ?Joice Lofts, PA-C ?[facility] ?05/01/2021, 4:18 PM ? ?

## 2021-05-01 NOTE — TOC Progression Note (Signed)
Transition of Care (TOC) - Progression Note  ? ? ?Patient Details  ?Name: [name] ?MRN: [id number] ?Date of Birth: 1934/07/30 ? ?Transition of Care (TOC) CM/SW Contact  ?Gordon Cellar, RN ?Phone Number: ?05/01/2021, 10:19 AM ? ?Clinical Narrative:    ?Spoke with [facility] concerning concerns related to DME delivered to the home. Daughter is requesting different DME. Confirmed Timothy Lasso is outreaching to daughter to clarify needs/concerns.  ? ? ?  ?  ? ?Expected Discharge Plan and Services ?  ?  ?  ?  ?  ?                ?  ?  ?  ?  ?  ?  ?  ?  ?  ?  ? ? ?Social Determinants of Health (SDOH) Interventions ?  ? ?Readmission Risk Interventions ?No flowsheet data found. ? ?

## 2021-05-01 NOTE — Progress Notes (Signed)
?Progress Note ? ? ?Patient: [name] NTZ:[id number] DOB: 02-22-1935 DOA: 04/25/2021     6 ?DOS: the patient was seen and examined on 05/01/2021 ?  ?Brief hospital course: ?[name] is an 87 year old female with PMH PAF on Xarelto, hypothyroidism, HTN, DMII, Hx CVA who presented after a fall.  Her family member had witnessed the fall and from description it sounds like she had pain associated with arthritis in her right knee causing it to give out and the patient then falling down onto her right side.  Her family endorsed that she has had significant pain in her knees chronically.  ?Work-up was notable for a displaced intertrochanteric fracture of the right hip.  She was evaluated by orthopedic surgery and underwent ORIF on 04/27/2021. ? ?Assessment and Plan: ?* Closed right hip fracture (HCC) ?- s/p mechanical fall likely from severe arthritis in bilateral knees, causing buckling and falling ?- Underwent ORIF with orthopedic surgery on 04/27/2021 ?-Patient hesitant to work aggressively with PT due to pain, weakness, and fear of falling again.  Family still wishes to take patient home with home health and is declining SNF ?-No noted neurodeficits on my evaluation including any dysmetria or cerebellar signs (gait deferred) however family extremely concerned over ruling out recurrent stroke contributing to her reason for falling. They have discussed this with outpatient neurology as well ?- will order MRI brain to definitively rule out/in CVA ? ?Hematoma of arm, right, subsequent encounter ?- due to fall and anticoagulation ?- hematoma stable and surrounding ecchymosis ?- continue xarelto  ?- Hgb stable as well ? ?Leukocytosis- (present on admission) ?- considered reactive; WBC has been downtrending  ? ?Physical deconditioning- (present on admission) ?The patient is s/p ORIF of the left hip on 04/27/2021. Recommendation is for SNF on discharge, but family has declined and wishes to take her home with home  health ? ?MCI (mild cognitive impairment)- (present on admission) ?Noted. ? ?Anemia- (present on admission) ?- Hemoglobin stable and improving, 10.4 g/dL this morning ?- Continue ferrous sulfate and B12 ?- Patient received 2 units PRBC on 04/27/2021 ? ?Iron deficiency- (present on admission) ?Continue ferrous sulfate 325 mg daily.  ? ?Insomnia- (present on admission) ?- Continue trazodone PRN at night ? ?Atrial fibrillation (HCC)- (present on admission) ?- Continue atenolol.  Heart rate has been appropriate during monitoring ?- Continue Xarelto ?- Hematoma stable on right upper extremity with large surrounding ecchymosis ? ?Hypothyroidism- (present on admission) ?- TSH normal, 2.25 ?- Continue Synthroid ? ?Type 2 diabetes mellitus with complication, with long-term current use of insulin (HCC) ?- Family reports intolerance to other insulins aside from Lantus therefore using home supply ?- A1c 8.3% on 04/12/2021 ?- Continue metformin and SSI ? ? ? ? ? ?Subjective: No events overnight.  Family present bedside this morning.  Patient working some with physical therapy as well.  Updated family and answered questions. ? ?Physical Exam: ?Vitals:  ? 04/30/21 2354 05/01/21 0352 05/01/21 0756 05/01/21 1158  ?BP: (!) 159/98 (!) 152/94 136/83 (!) 141/62  ?Pulse: 73 91 91 89  ?Resp: 18 20 18 18   ?Temp: 98 ?F (36.7 ?C) (!) 97.5 ?F (36.4 ?C) 98.2 ?F (36.8 ?C) 98.8 ?F (37.1 ?C)  ?TempSrc:   Oral Oral  ?SpO2: 98% 100% 99% 98%  ?Weight:      ?Height:      ? ?Physical Exam ?Constitutional:   ?   Appearance: Normal appearance.  ?HENT:  ?   Head: Normocephalic and atraumatic.  ?   Mouth/Throat:  ?  Mouth: Mucous membranes are moist.  ?Eyes:  ?   Extraocular Movements: Extraocular movements intact.  ?Cardiovascular:  ?   Rate and Rhythm: Normal rate and regular rhythm.  ?Pulmonary:  ?   Effort: Pulmonary effort is normal.  ?   Breath sounds: Normal breath sounds.  ?Abdominal:  ?   General: Bowel sounds are normal. There is no distension.   ?   Palpations: Abdomen is soft.  ?   Tenderness: There is no abdominal tenderness.  ?Musculoskeletal:  ?   Cervical back: Normal range of motion and neck supple.  ?   Comments: Surgical dressings noted in right lower extremity; swelling as expected in right lower extremity, compartments soft  ?Skin: ?   Comments: Large lime sized hematoma noted in right upper arm with surrounding ecchymoses involving most of upper arm  ?Neurological:  ?   Mental Status: She is alert.  ?   Comments: No true focal deficits.  Right lower extremity strength limited due to pain from surgery.  No paresthesias or dysmetria.  Gait deferred  ? ? ? ?Data Reviewed: ?Results for orders placed or performed during the hospital encounter of 04/25/21 (from the past 24 hour(s))  ?Glucose, capillary     Status: Abnormal  ? Collection Time: 04/30/21  4:42 PM  ?Result Value Ref Range  ? Glucose-Capillary 215 (H) 70 - 99 mg/dL  ?Glucose, capillary     Status: Abnormal  ? Collection Time: 04/30/21  9:03 PM  ?Result Value Ref Range  ? Glucose-Capillary 261 (H) 70 - 99 mg/dL  ?CBC with Differential/Platelet     Status: Abnormal  ? Collection Time: 05/01/21  3:20 AM  ?Result Value Ref Range  ? WBC 15.8 (H) 4.0 - 10.5 K/uL  ? RBC 3.89 3.87 - 5.11 MIL/uL  ? Hemoglobin 10.4 (L) 12.0 - 15.0 g/dL  ? HCT 32.8 (L) 36.0 - 46.0 %  ? MCV 84.3 80.0 - 100.0 fL  ? MCH 26.7 26.0 - 34.0 pg  ? MCHC 31.7 30.0 - 36.0 g/dL  ? RDW 16.0 (H) 11.5 - 15.5 %  ? Platelets 213 150 - 400 K/uL  ? nRBC 0.1 0.0 - 0.2 %  ? Neutrophils Relative % 86 %  ? Neutro Abs 13.7 (H) 1.7 - 7.7 K/uL  ? Lymphocytes Relative 7 %  ? Lymphs Abs 1.1 0.7 - 4.0 K/uL  ? Monocytes Relative 6 %  ? Monocytes Absolute 0.9 0.1 - 1.0 K/uL  ? Eosinophils Relative 0 %  ? Eosinophils Absolute 0.0 0.0 - 0.5 K/uL  ? Basophils Relative 0 %  ? Basophils Absolute 0.0 0.0 - 0.1 K/uL  ? Immature Granulocytes 1 %  ? Abs Immature Granulocytes 0.11 (H) 0.00 - 0.07 K/uL  ?Basic metabolic panel     Status: Abnormal  ?  Collection Time: 05/01/21  3:20 AM  ?Result Value Ref Range  ? Sodium 132 (L) 135 - 145 mmol/L  ? Potassium 4.7 3.5 - 5.1 mmol/L  ? Chloride 101 98 - 111 mmol/L  ? CO2 27 22 - 32 mmol/L  ? Glucose, Bld 159 (H) 70 - 99 mg/dL  ? BUN 27 (H) 8 - 23 mg/dL  ? Creatinine, Ser 0.59 0.44 - 1.00 mg/dL  ? Calcium 8.4 (L) 8.9 - 10.3 mg/dL  ? GFR, Estimated >60 >60 mL/min  ? Anion gap 4 (L) 5 - 15  ?Glucose, capillary     Status: Abnormal  ? Collection Time: 05/01/21  7:54 AM  ?Result Value Ref Range  ?  Glucose-Capillary 149 (H) 70 - 99 mg/dL  ?Glucose, capillary     Status: Abnormal  ? Collection Time: 05/01/21 11:55 AM  ?Result Value Ref Range  ? Glucose-Capillary 169 (H) 70 - 99 mg/dL  ?  ?I have Reviewed nursing notes, Vitals, and Lab results since pt's last encounter. Pertinent lab results : see above ?I have ordered test including BMP, CBC, Mg ?I have reviewed the last note from staff over past 24 hours ?I have discussed pt's care plan and test results with nursing staff, case manager ? ? ?Family Communication: daughter ? ?Disposition: ?Status is: Inpatient ?Remains inpatient appropriate because: Treatment as outlined in A&P ? ? ? Planned Discharge Destination: Home with Home Health ? ?Antimicrobials: ? ? ?Consultants: ?Ortho surgery ? ?Procedures:  ?ORIF of right hip fracture, 04/27/2021 ?B/L knee injections with kenalog, 04/27/21 ? ?DVT ppx:  ?SCDs Start: 04/27/21 1244 ?Place TED hose Start: 04/27/21 1244 ?Rivaroxaban (XARELTO) tablet 15 mg  ? ?  Code Status: Full Code  ? ? ?Author: ?Lewie Chamber, MD ?05/01/2021 3:05 PM ? ?For on call review [email or web address].  ? ?

## 2021-05-01 NOTE — Progress Notes (Signed)
Inpatient Diabetes Program Recommendations ? ?AACE/ADA: New Consensus Statement on Inpatient Glycemic Control ? ?Target Ranges:  Prepandial:   less than 140 mg/dL ?     Peak postprandial:   less than 180 mg/dL (1-2 hours) ?     Critically ill patients:  140 - 180 mg/dL  ? ? Latest Reference Range & Units 04/30/21 08:16 04/30/21 12:48 04/30/21 16:42 04/30/21 21:03 05/01/21 07:54  ?Glucose-Capillary 70 - 99 mg/dL 527 (H) 782 (H) 423 (H) 261 (H) 149 (H)  ? ?Review of Glycemic Control ? ?Current orders for Inpatient glycemic control: Lantus 10 units daily, Novolog 0-9 units TID with meals, Novolog 0-5 units QHS, Metformin 500 mg QAM ? ?Inpatient Diabetes Program Recommendations:   ? ?Insulin: Please consider ordering Novolog 3 units TID with meals for meal coverage if patient eats at least 50% of meals. ? ?Thanks, ?Orlando Penner, RN, MSN, CDE ?Diabetes Coordinator ?Inpatient Diabetes Program ?[phone] (Team Pager from 8am to 5pm) ? ? ?

## 2021-05-02 DIAGNOSIS — R5381 Other malaise: Secondary | ICD-10-CM

## 2021-05-02 LAB — MAGNESIUM: Magnesium: 1.8 mg/dL (ref 1.7–2.4)

## 2021-05-02 LAB — BASIC METABOLIC PANEL
Anion gap: 9 (ref 5–15)
BUN: 34 mg/dL — ABNORMAL HIGH (ref 8–23)
CO2: 24 mmol/L (ref 22–32)
Calcium: 8.6 mg/dL — ABNORMAL LOW (ref 8.9–10.3)
Chloride: 100 mmol/L (ref 98–111)
Creatinine, Ser: 0.84 mg/dL (ref 0.44–1.00)
GFR, Estimated: >60 mL/min (ref >60)
Glucose, Bld: 172 mg/dL — ABNORMAL HIGH (ref 70–99)
Potassium: 5 mmol/L (ref 3.5–5.1)
Sodium: 133 mmol/L — ABNORMAL LOW (ref 135–145)

## 2021-05-02 LAB — CBC WITH DIFFERENTIAL/PLATELET
Abs Immature Granulocytes: 0.12 10*3/uL — ABNORMAL HIGH (ref 0.00–0.07)
Basophils Absolute: 0 10*3/uL (ref 0.0–0.1)
Basophils Relative: 0 %
Eosinophils Absolute: 0 10*3/uL (ref 0.0–0.5)
Eosinophils Relative: 0 %
HCT: 32.1 % — ABNORMAL LOW (ref 36.0–46.0)
Hemoglobin: 10.4 g/dL — ABNORMAL LOW (ref 12.0–15.0)
Immature Granulocytes: 1 %
Lymphocytes Relative: 9 %
Lymphs Abs: 1.2 10*3/uL (ref 0.7–4.0)
MCH: 27.1 pg (ref 26.0–34.0)
MCHC: 32.4 g/dL (ref 30.0–36.0)
MCV: 83.6 fL (ref 80.0–100.0)
Monocytes Absolute: 0.9 10*3/uL (ref 0.1–1.0)
Monocytes Relative: 6 %
Neutro Abs: 11.4 10*3/uL — ABNORMAL HIGH (ref 1.7–7.7)
Neutrophils Relative %: 84 %
Platelets: 222 10*3/uL (ref 150–400)
RBC: 3.84 MIL/uL — ABNORMAL LOW (ref 3.87–5.11)
RDW: 16 % — ABNORMAL HIGH (ref 11.5–15.5)
WBC: 13.6 10*3/uL — ABNORMAL HIGH (ref 4.0–10.5)
nRBC: 0 % (ref 0.0–0.2)

## 2021-05-02 LAB — GLUCOSE, CAPILLARY
Glucose-Capillary: 186 mg/dL — ABNORMAL HIGH (ref 70–99)
Glucose-Capillary: 404 mg/dL — ABNORMAL HIGH (ref 70–99)
Glucose-Capillary: 428 mg/dL — ABNORMAL HIGH (ref 70–99)
Glucose-Capillary: 284 mg/dL — ABNORMAL HIGH (ref 70–99)

## 2021-05-02 MED ORDER — HYDROCODONE-ACETAMINOPHEN 5-325 MG PO TABS: 1.0000 | ORAL_TABLET | Freq: Four times a day (QID) | ORAL | 0 refills | Status: DC | PRN

## 2021-05-02 NOTE — Discharge Summary (Signed)
Physician Discharge Summary   Patient: [name] MRN: [id number] DOB: 05/11/1934  Admit date:     04/25/2021  Discharge date: 05/02/21  Discharge Physician: Dwyane Dee   PCP: Ria Bush, MD   Recommendations at discharge:   Follow-up with orthopedic surgery  Discharge Diagnoses: Principal Problem:   Closed right hip fracture (HCC) Active Problems:   Atrial fibrillation (HCC)   Physical deconditioning   Hematoma of arm, right, subsequent encounter   Type 2 diabetes mellitus with complication, with long-term current use of insulin (HCC)   Hypothyroidism   Insomnia   Iron deficiency   Anemia   MCI (mild cognitive impairment)   Leukocytosis  Resolved Problems:   * No resolved hospital problems. *   Hospital Course: [name] is an 87 year old female with PMH PAF on Xarelto, hypothyroidism, HTN, DMII, Hx CVA who presented after a fall.  Her family member had witnessed the fall and from description it sounds like she had pain associated with arthritis in her right knee causing it to give out and the patient then falling down onto her right side.  Her family endorsed that she has had significant pain in her knees chronically.  Work-up was notable for a displaced intertrochanteric fracture of the right hip.  She was evaluated by orthopedic surgery and underwent ORIF on 04/27/2021. See below for further problem-based plan.  Assessment and Plan: * Closed right hip fracture (HCC) - s/p mechanical fall likely from severe arthritis in bilateral knees, causing buckling and falling - Underwent ORIF with orthopedic surgery on 04/27/2021 -Patient hesitant to work aggressively with PT due to pain, weakness, and fear of falling again.  Family still wishes to take patient home with home health and is declining SNF -No noted neurodeficits on my evaluation including any dysmetria or cerebellar signs (gait deferred) however family extremely concerned over ruling out recurrent stroke  contributing to her reason for falling. They have discussed this with outpatient neurology as well -MRI brain performed on 05/01/2021 is negative for acute stroke.  Does show prior known right insula/temporal lobe infarct and also diffuse atrophy and chronic small vessel ischemia.  Findings discussed with her son bedside prior to discharge  Physical deconditioning The patient is s/p ORIF of the left hip on 04/27/2021. Recommendation is for SNF on discharge, but family has declined and wishes to take her home with home health  Atrial fibrillation ([place]) - Continue atenolol.  Heart rate has been appropriate during monitoring - Continue Xarelto - Hematoma stable on right upper extremity with large surrounding ecchymosis  Hematoma of arm, right, subsequent encounter - due to fall and anticoagulation - hematoma stable and surrounding ecchymosis - continue xarelto  - Hgb stable as well  Leukocytosis - considered reactive; WBC has been downtrending   MCI (mild cognitive impairment) Noted.  Anemia - Hemoglobin stable and improving - Continue ferrous sulfate and B12 - Patient received 2 units PRBC on 04/27/2021  Iron deficiency Continue ferrous sulfate 325 mg daily.   Insomnia - Continue trazodone PRN at night  Hypothyroidism - TSH normal, 2.25 - Continue Synthroid  Type 2 diabetes mellitus with complication, with long-term current use of insulin ([place]) - Family reports intolerance to other insulins aside from Lantus therefore using home supply - A1c 8.3% on 04/12/2021 - Continue metformin and SSI       Consultants: Orthopedic surgery Procedures performed:  ORIF of right hip fracture, 04/27/2021 B/L knee injections with kenalog, 04/27/21  Disposition:  Home with home health Diet recommendation:  Discharge Diet Orders (From admission, onward)     Start     Ordered   05/02/21 0000  Diet - low sodium heart healthy        05/02/21 1604           Cardiac diet  DISCHARGE  MEDICATION: Allergies as of 05/02/2021       Reactions   Chloramphenicols Swelling   Moderate facial swelling   Influenza Vaccines Other (See Comments)   Bad reaction        Medication List     TAKE these medications    atenolol 25 MG tablet Commonly known as: TENORMIN TAKE 1 TABLET (25 MG TOTAL) BY MOUTH DAILY.   BD Pen Needle Nano 2nd Gen 32G X 4 MM Misc Generic drug: Insulin Pen Needle USE TO ADMINISTER INSULIN AS DIRECTED   Bioflex Tabs Take 1 tablet by mouth daily.   HYDROcodone-acetaminophen 5-325 MG tablet Commonly known as: NORCO/VICODIN Take 1-2 tablets by mouth every 6 (six) hours as needed for moderate pain.   Iron (Ferrous Sulfate) 325 (65 Fe) MG Tabs Take 325 mg by mouth every other day.   Lantus SoloStar 100 UNIT/ML Solostar Pen Generic drug: insulin glargine Inject 8 Units into the skin daily as needed. What changed:  how much to take when to take this   levothyroxine 75 MCG tablet Commonly known as: SYNTHROID TAKE 1 TABLET BY MOUTH DAILY BEFORE BREAKFAST.   metFORMIN 500 MG tablet Commonly known as: GLUCOPHAGE Take 1 tablet (500 mg total) by mouth 2 (two) times daily with a meal. What changed: when to take this   polyethylene glycol powder 17 GM/SCOOP powder Commonly known as: GLYCOLAX/MIRALAX Take 17 g by mouth daily as needed for moderate constipation.   rivaroxaban 20 MG Tabs tablet Commonly known as: Xarelto Take 1 tablet (20 mg total) by mouth daily with supper. What changed: when to take this   traZODone 50 MG tablet Commonly known as: DESYREL Take 0.5-1 tablets (25-50 mg total) by mouth at bedtime. What changed:  when to take this reasons to take this   vitamin B-12 1000 MCG tablet Commonly known as: CYANOCOBALAMIN Take 1 tablet (1,000 mcg total) by mouth every Monday, Wednesday, and Friday.               Durable Medical Equipment  (From admission, onward)           Start     Ordered   05/01/21 1537  For home  use only DME Hospital bed  Once       Comments: Fully electric bed not semi electric  Question Answer Comment  Length of Need Lifetime   Patient has (list medical condition): hip fracture   The above medical condition requires: Patient requires the ability to reposition frequently   Bed type Semi-electric   Support Surface: Gel Overlay      05/01/21 1541   04/30/21 1011  For home use only DME lightweight manual wheelchair with seat cushion  Once       Comments: Patient suffers from Hip fracture which impairs their ability to perform daily activities like grooming and toileting in the home.  A walker will not resolve  issue with performing activities of daily living. A wheelchair will allow patient to safely perform daily activities. Patient is not able to propel themselves in the home using a standard weight wheelchair due to endurance and general weakness. Patient can self propel in the lightweight wheelchair. Length of need Lifetime. Accessories:  elevating leg rests (ELRs), wheel locks, extensions and anti-tippers.   04/30/21 1010   04/30/21 0952  For home use only DME Other see comment  Once       Comments: Wheelchair  Question:  Length of Need  Answer:  Lifetime   04/30/21 [id number]   04/29/21 1635  For home use only DME wheelchair cushion (seat and back)  Once        04/29/21 1634   04/29/21 1538  For home use only DME 3 n 1  Once        04/29/21 1537   04/29/21 1521  For home use only DME Other see comment  Once       Comments: Harrel Lemon lift  Question:  Length of Need  Answer:  Lifetime   04/29/21 1521   04/29/21 1520  For home use only DME Walker rolling  Once       Question Answer Comment  Walker: With [place] Wheels   Patient needs a walker to treat with the following condition Ambulatory dysfunction      04/29/21 1520   04/29/21 1520  For home use only DME Hospital bed  Once       Question Answer Comment  Length of Need Lifetime   The above medical condition requires: Patient  requires the ability to reposition frequently   Bed type Semi-electric   [place] Lift Yes      04/29/21 1520              Discharge Care Instructions  (From admission, onward)           Start     Ordered   05/02/21 0000  Discharge wound care:       Comments: Keep surgical dressing dry and in place until follow up with orthopedic surgery   05/02/21 1604            Follow-up Information     Poggi, Marshall Cork, MD Follow up in 6 week(s).   Specialty: Orthopedic Surgery Why: X-rays of the right femur. Staple can be removed by home health on 05/11/21 Contact information: [place] [id number] [phone]                 Discharge Exam: Danley Danker Weights   04/25/21 1654  Weight: 59.4 kg   Physical Exam Constitutional:      Appearance: Normal appearance.  HENT:     Head: Normocephalic and atraumatic.     Mouth/Throat:     Mouth: Mucous membranes are moist.  Eyes:     Extraocular Movements: Extraocular movements intact.  Cardiovascular:     Rate and Rhythm: Normal rate and regular rhythm.  Pulmonary:     Effort: Pulmonary effort is normal.     Breath sounds: Normal breath sounds.  Abdominal:     General: Bowel sounds are normal. There is no distension.     Palpations: Abdomen is soft.     Tenderness: There is no abdominal tenderness.  Musculoskeletal:     Cervical back: Normal range of motion and neck supple.     Comments: Surgical dressings noted in right lower extremity; swelling as expected in right lower extremity, compartments soft  Skin:    Comments: Large lime sized hematoma noted in right upper arm with surrounding ecchymoses involving most of upper arm  Neurological:     Mental Status: She is alert.     Comments: No true focal deficits.  Right lower  extremity strength limited due to pain from surgery.  No paresthesias or dysmetria.  Gait deferred     Condition at discharge: stable  The results of  significant diagnostics from this hospitalization (including imaging, microbiology, ancillary and laboratory) are listed below for reference.   Imaging Studies: MR BRAIN WO CONTRAST  Result Date: 05/01/2021 CLINICAL DATA:  Acute neurologic deficit. Right lower extremity weakness. EXAM: MRI HEAD WITHOUT CONTRAST TECHNIQUE: Multiplanar, multiecho pulse sequences of the brain and surrounding structures were obtained without intravenous contrast. COMPARISON:  07/01/2019 FINDINGS: Brain: No acute infarct, mass effect or extra-axial collection. No acute or chronic hemorrhage. Diffuse atrophy with multifocal white matter hyperintensity consistent with chronic small vessel ischemia. Old infarcts of the right insula and subcortical right temporal lobe. The midline structures are normal. Vascular: Major flow voids are preserved. Skull and upper cervical spine: Normal calvarium and skull base. Visualized upper cervical spine and soft tissues are normal. Sinuses/Orbits:No paranasal sinus fluid levels or advanced mucosal thickening. No mastoid or middle ear effusion. Normal orbits. IMPRESSION: 1. No acute intracranial abnormality. 2. Diffuse atrophy and chronic small vessel ischemia. Old infarcts of the right insula and temporal lobe. Electronically Signed   By: Ulyses Jarred M.D.   On: 05/01/2021 22:52   DG Chest Port 1 View  Result Date: 04/25/2021 CLINICAL DATA:  Fall EXAM: PORTABLE CHEST 1 VIEW COMPARISON:  None. FINDINGS: The heart size and mediastinal contours are within normal limits. Both lungs are clear. The visualized skeletal structures are unremarkable. IMPRESSION: No active disease. Electronically Signed   By: Franchot Gallo M.D.   On: 04/25/2021 17:57   DG Humerus Right  Result Date: 04/25/2021 CLINICAL DATA:  Fall.  Right shoulder swelling EXAM: RIGHT HUMERUS - 2+ VIEW COMPARISON:  None. FINDINGS: There is no evidence of fracture or other focal bone lesions. Soft tissues are unremarkable. IMPRESSION:  Negative. Electronically Signed   By: Franchot Gallo M.D.   On: 04/25/2021 17:57   DG C-Arm 1-60 Min-No Report  Result Date: 04/27/2021 Fluoroscopy was utilized by the requesting physician.  No radiographic interpretation.   DG HIP UNILAT WITH PELVIS 2-3 VIEWS RIGHT  Result Date: 04/27/2021 CLINICAL DATA:  Fluoroscopic assistance for internal fixation of right femur EXAM: DG HIP (WITH OR WITHOUT PELVIS) 2-3V RIGHT COMPARISON:  None. FINDINGS: Fluoroscopic images show internal fixation of intertrochanteric fracture of right femur with intramedullary rod. Fluoroscopic time was 44.5 seconds. Estimated radiation dose is 3.35 mGy. IMPRESSION: Fluoroscopic assistance was provided for internal fixation of intertrochanteric fracture of right femur. Electronically Signed   By: Elmer Picker M.D.   On: 04/27/2021 11:31   DG Knee 3 Views Left  Result Date: 04/28/2021 CLINICAL DATA:  Pain EXAM: LEFT KNEE - 3 VIEW COMPARISON:  05/02/2020 FINDINGS: No recent fracture or dislocation is seen. Degenerative changes are noted with joint space narrowing and bony spurs in medial, lateral and patellofemoral compartments. There is soft tissue fullness in the suprapatellar bursa. Arterial calcifications are seen in the soft tissues. IMPRESSION: No recent fracture or dislocation is seen in the left knee. Degenerative changes are noted. Small effusion is present in suprapatellar bursa. Electronically Signed   By: Elmer Picker M.D.   On: 04/28/2021 10:58   DG Knee 3 Views Right  Result Date: 04/28/2021 CLINICAL DATA:  Pain EXAM: RIGHT KNEE - 3 VIEW COMPARISON:  None FINDINGS: No recent fracture or dislocation is seen. Skin staples are seen in the lateral aspect of lower thigh. Intramedullary rod is seen in femur.  Degenerative changes are noted with bony spurs in medial, lateral and patellofemoral compartments. There are smooth marginated calcifications adjacent to the upper margin of patella, possibly suggesting  calcific bursitis or tendinosis. Extensive arterial calcifications are seen. IMPRESSION: No recent fracture or dislocation is seen. Degenerative changes are noted. Recent postsurgical changes seen in the right femur. Electronically Signed   By: Elmer Picker M.D.   On: 04/28/2021 10:56    Microbiology: Results for orders placed or performed during the hospital encounter of 04/25/21  Resp Panel by RT-PCR (Flu A&B, Covid) Nasopharyngeal Swab     Status: None   Collection Time: 04/25/21  5:02 PM   Specimen: Nasopharyngeal Swab; Nasopharyngeal(NP) swabs in vial transport medium  Result Value Ref Range Status   SARS Coronavirus 2 by RT PCR NEGATIVE NEGATIVE Final    Comment: (NOTE) SARS-CoV-2 target nucleic acids are NOT DETECTED.  The SARS-CoV-2 RNA is generally detectable in upper respiratory specimens during the acute phase of infection. The lowest concentration of SARS-CoV-2 viral copies this assay can detect is 138 copies/mL. A negative result does not preclude SARS-Cov-2 infection and should not be used as the sole basis for treatment or other patient management decisions. A negative result may occur with  improper specimen collection/handling, submission of specimen other than nasopharyngeal swab, presence of viral mutation(s) within the areas targeted by this assay, and inadequate number of viral copies(<138 copies/mL). A negative result must be combined with clinical observations, patient history, and epidemiological information. The expected result is Negative.  Fact Sheet for Patients:  EntrepreneurPulse.com.au  Fact Sheet for Healthcare Providers:  IncredibleEmployment.be  This test is no t yet approved or cleared by the [place] FDA and  has been authorized for detection and/or diagnosis of SARS-CoV-2 by FDA under an Emergency Use Authorization (EUA). This EUA will remain  in effect (meaning this test can be used) for the  duration of the COVID-19 declaration under Section 564(b)(1) of the Act, 21 U.S.C.section 360bbb-3(b)(1), unless the authorization is terminated  or revoked sooner.       Influenza A by PCR NEGATIVE NEGATIVE Final   Influenza B by PCR NEGATIVE NEGATIVE Final    Comment: (NOTE) The Xpert Xpress SARS-CoV-2/FLU/RSV plus assay is intended as an aid in the diagnosis of influenza from Nasopharyngeal swab specimens and should not be used as a sole basis for treatment. Nasal washings and aspirates are unacceptable for Xpert Xpress SARS-CoV-2/FLU/RSV testing.  Fact Sheet for Patients: EntrepreneurPulse.com.au  Fact Sheet for Healthcare Providers: IncredibleEmployment.be  This test is not yet approved or cleared by the [place] FDA and has been authorized for detection and/or diagnosis of SARS-CoV-2 by FDA under an Emergency Use Authorization (EUA). This EUA will remain in effect (meaning this test can be used) for the duration of the COVID-19 declaration under Section 564(b)(1) of the Act, 21 U.S.C. section 360bbb-3(b)(1), unless the authorization is terminated or revoked.  Performed at [facility], [address] [place], [place] [address]     Labs: CBC: Recent Labs  Lab 04/28/21 [phone] 04/28/21 1634 04/29/21 0512 04/30/21 1416 05/01/21 0320 05/02/21 0405  WBC 15.0*  --  17.9* 16.5* 15.8* 13.6*  NEUTROABS  --   --   --   --  13.7* 11.4*  HGB 9.7* 9.7* 9.8* 10.2* 10.4* 10.4*  HCT 29.7* 30.0* 30.1* 31.8* 32.8* 32.1*  MCV 81.6  --  81.4 83.9 84.3 83.6  PLT 144*  --  182 201 213 [id number]   Basic Metabolic Panel:  Recent Labs  Lab 04/28/21 0439 04/29/21 0512 04/30/21 0610 05/01/21 0320 05/02/21 0405  NA 130* 134* 135 132* 133*  K 4.2 4.3 4.6 4.7 5.0  CL 100 99 100 101 100  CO2 21* 24 27 27 24   GLUCOSE 233* 209* 190* 159* 172*  BUN 21 28* 29* 27* 34*  CREATININE 0.77 0.66 0.67 0.59 0.84  CALCIUM 8.2* 8.4* 9.1 8.4* 8.6*   MG  --   --   --   --  1.8   Liver Function Tests: No results for input(s): AST, ALT, ALKPHOS, BILITOT, PROT, ALBUMIN in the last 168 hours. CBG: Recent Labs  Lab 05/01/21 2129 05/02/21 0757 05/02/21 1230 05/02/21 1258 05/02/21 1635  GLUCAP 198* 186* 404* 428* 284*    Discharge time spent: greater than 30 minutes.  Signed: Dwyane Dee, MD [facility] 05/02/2021

## 2021-05-02 NOTE — Progress Notes (Signed)
DISCHARGE NOTE: ? ?Pt and daughters given discharge instructions, with Spanish interpreter at bedside. They verbalized understanding. Extra honeycomb dressings sent with pt. Pt waiting on EMS. ?

## 2021-05-02 NOTE — TOC Progression Note (Signed)
Transition of Care (TOC) - Progression Note  ? ? ?Patient Details  ?Name: [name] ?MRN: [id number] ?Date of Birth: [birth year] ? ?Transition of Care (TOC) CM/SW Contact  ?Rafel Garde A Jonahtan Manseau, LCSW ?Phone Number: ?05/02/2021, 4:01 PM ? ?Clinical Narrative:   CSW confirmed with pt's daughter that DME was on its way to the house. Pt's daughter aware pt is dc. Pt's daughter ask that EMS be set up. CSW explained EMS has a list so CSW was going to go ahead and set it up. Pt's daughter agreeable. MD and RN aware. ? ?HH arranged. ?Personal caregiver list provided to pt's daughter via email. ?DME being delivered.  ? ? ? ? ?  ?  ? ?Expected Discharge Plan and Services ?  ?  ?  ?  ?  ?                ?  ?  ?  ?  ?  ?  ?  ?  ?  ?  ? ? ?Social Determinants of Health (SDOH) Interventions ?  ? ?Readmission Risk Interventions ?No flowsheet data found. ? ?

## 2021-05-02 NOTE — Progress Notes (Signed)
Inpatient Diabetes Program Recommendations ? ?AACE/ADA: New Consensus Statement on Inpatient Glycemic Control  ? ?Target Ranges:  Prepandial:   less than 140 mg/dL ?     Peak postprandial:   less than 180 mg/dL (1-2 hours) ?     Critically ill patients:  140 - 180 mg/dL  ? ? Latest Reference Range & Units 05/02/21 07:57 05/02/21 12:30 05/02/21 12:58  ?Glucose-Capillary 70 - 99 mg/dL 720 (H) 947 (H) 096 (H)  ? ? Latest Reference Range & Units 05/01/21 07:54 05/01/21 11:55 05/01/21 17:01 05/01/21 20:11 05/01/21 21:29  ?Glucose-Capillary 70 - 99 mg/dL 283 (H) 662 (H) 947 (H) 235 (H) 198 (H)  ? ?Review of Glycemic Control ?Current orders for Inpatient glycemic control: Lantus 10 units daily, Novolog 0-9 units TID with meals, Novolog 0-5 units QHS, Metformin 500 mg QAM ?  ?Inpatient Diabetes Program Recommendations:   ?  ?Insulin: Please consider ordering Novolog 3 units TID with meals for meal coverage if patient eats at least 50% of meals. ?  ?Thanks, ?Orlando Penner, RN, MSN, CDE ?Diabetes Coordinator ?Inpatient Diabetes Program ?[phone] (Team Pager from 8am to 5pm) ? ?

## 2021-05-02 NOTE — Progress Notes (Signed)
EMS here to transport pt, daughters at bedside.  ?

## 2021-05-02 NOTE — Progress Notes (Addendum)
?Subjective: ?5 Days Post-Op Procedure(s) (LRB): ?INTRAMEDULLARY (IM) NAIL INTERTROCHANTRIC (Right) ?STEROID INJECTION TO BILATERAL KNEES (Bilateral) ?Bilateral knee osteoarthritis. ?Daughters at bedside this afternoon. ?Patient denies any pain currently.  ?Patient is well, and has had no acute complaints or problems ?Plan is to go Home with HHPT after hospital stay.  Arrangements are being made for home health therapy. ?Negative for chest pain and shortness of breath ?Fever: no recent fevers ? ?Objective: ?Vital signs in last 24 hours: ?Temp:  [97.4 ?F (36.3 ?C)-98.7 ?F (37.1 ?C)] 97.4 ?F (36.3 ?C) (03/02 1222) ?Pulse Rate:  [51-102] 102 (03/02 1222) ?Resp:  [15-20] 18 (03/02 1222) ?BP: (129-160)/(72-98) 129/80 (03/02 1222) ?SpO2:  [98 %-100 %] 100 % (03/02 1222) ? ?Intake/Output from previous day: ? ?Intake/Output Summary (Last 24 hours) at 05/02/2021 1228 ?Last data filed at 05/02/2021 0543 ?Gross per 24 hour  ?Intake 0 ml  ?Output 500 ml  ?Net -500 ml  ?  ?Intake/Output this shift: ?No intake/output data recorded. ? ?Labs: ?Recent Labs  ?  04/30/21 ?1416 05/01/21 ?0320 05/02/21 ?0405  ?HGB 10.2* 10.4* 10.4*  ? ?Recent Labs  ?  05/01/21 ?0320 05/02/21 ?0405  ?WBC 15.8* 13.6*  ?RBC 3.89 3.84*  ?HCT 32.8* 32.1*  ?PLT 213 222  ? ?Recent Labs  ?  05/01/21 ?0320 05/02/21 ?0405  ?NA 132* 133*  ?K 4.7 5.0  ?CL 101 100  ?CO2 27 24  ?BUN 27* 34*  ?CREATININE 0.59 0.84  ?GLUCOSE 159* 172*  ?CALCIUM 8.4* 8.6*  ? ?No results for input(s): LABPT, INR in the last 72 hours. ? ?EXAM ?General - Patient is Alert, Appropriate, and Oriented ?Extremity - ABD soft ?Neurovascular intact ?Dorsiflexion/Plantar flexion intact ?Compartment soft ?Dressing/Incision -clean, dry, no drainage ?Motor Function - intact, moving foot and toes well on exam.  ?No signs of infection to the right leg. ? ?Assessment/Plan: ?5 Days Post-Op Procedure(s) (LRB): ?INTRAMEDULLARY (IM) NAIL INTERTROCHANTRIC (Right) ?STEROID INJECTION TO BILATERAL KNEES  (Bilateral) ?Principal Problem: ?  Closed right hip fracture (HCC) ?Active Problems: ?  Type 2 diabetes mellitus with complication, with long-term current use of insulin (HCC) ?  Hypothyroidism ?  Atrial fibrillation (HCC) ?  Insomnia ?  Iron deficiency ?  Anemia ?  MCI (mild cognitive impairment) ?  Physical deconditioning ?  Leukocytosis ?  Hematoma of arm, right, subsequent encounter ? ?Estimated body mass index is 24.75 kg/m? as calculated from the following: ?  Height as of this encounter: 5\' 1"  (1.549 m). ?  Weight as of this encounter: 59.4 kg. ?Advance diet ?Up with therapy ? ?WBC up to trending down to 13.6 today, UA negative for UTI. ?Brain MRI without any acute findings. ?Dr. has ordered knee braces for the patient for added stability given her significant knee osteoarthritis.  Will obtain these as an outpatient at this time. ?Monovisc injections were approved by insurance and performed by me at the bedside today, these injections are described below. ?Upon discharge, staples can be removed by the home health agency on 05/11/21.  Follow-up with Dr. 07/11/21 in 6 weeks for x-rays of the right femur. ? ?Bilateral knee Monovisc Injections: ?The patient was instructed on the risk and benefits of bilateral knee Monovisc injections which include but are not limited to infection, skin irritation, bleeding, nerve injury, ATC.  After discussion of the risk and benefits with patient and daughters the patient elected to proceed with bilateral knee Monovisc injections.  The lateral aspect of the right knee was cleaned using a alcohol swab prior  to topical anesthesia being obtained using ethyl chloride.  The right knee was injected with 3 cc of 1% lidocaine without epinephrine using a suprapatellar approach.  The knee was then injected with 4 cc of Monovisc and a Band-Aid was applied.  The lateral aspect of the left knee was then cleaned using alcohol swab prior to topical anesthesia being obtained using ethyl  chloride, the left knee was injected with 3 cc of 1% lidocaine without epinephrine using a suprapatellar approach, the left knee was then injected with 4 cc of Monovisc and a Band-Aid was applied.  The patient tolerated both injections without any complications. ? ?DVT Prophylaxis - Xarelto, Ted hose, and SCDs ?Weight-Bearing as tolerated to right leg ? ?Valeria Batman, PA-C ?[facility] ?05/02/2021, 12:28 PM ? ?

## 2021-05-02 NOTE — Progress Notes (Addendum)
Physical Therapy Treatment ?Patient Details ?Name: [name] ?MRN: [id number] ?DOB: 1934-07-07 ?Today's Date: 05/02/2021 ? ? ?History of Present Illness Pt admitted 04/25/21 for mechanical fall that resulted in R hip fx, s/p ORIF with TFN nail 2/25 by Dr. Joice Lofts. Reports hurting R shoulder during fall; imaging negative for acute fracture. Significant PMH includes: Afib on anticoagulation, HTN, hepatitis, hypothyroidism, and hx of CVA. ? ?  ?PT Comments  ? ? Patient agreeable to PT treatment session with 2 daughters present throughout session. Patient participated with BLE therapeutic exercises for strengthening in supine position and sitting position. She continues to need physical assistance for bed mobility and transfers and family education continued today. The patient was able to stand x 4 bouts with +2 person assistance with limited standing tolerance. Pre gait activity performed with emphasis on upright posture with standing tolerance 10 seconds at most. Unable to progress ambulation at this time. Recommend to continue PT to maximize independence and decrease caregiver burden. The family continues to plan bringing patient home at discharge.  ?  ?Recommendations for follow up therapy are one component of a multi-disciplinary discharge planning process, led by the attending physician.  Recommendations may be updated based on patient status, additional functional criteria and insurance authorization. ? ?Follow Up Recommendations ? Skilled nursing-short term rehab (<3 hours/day) ?  ?  ?Assistance Recommended at Discharge Frequent or constant Supervision/Assistance  ?Patient can return home with the following Two people to help with walking and/or transfers;A lot of help with bathing/dressing/bathroom;Assistance with cooking/housework;Assist for transportation;Help with stairs or ramp for entrance ?  ?Equipment Recommendations ? Rolling walker (2 wheels);BSC/3in1;Hospital bed;Wheelchair (measurements PT);Wheelchair  cushion (measurements PT);Other (comment)  ?  ?Recommendations for Other Services   ? ? ?  ?Precautions / Restrictions Precautions ?Precautions: Fall ?Precaution Comments: B knee arthritis ?Restrictions ?Weight Bearing Restrictions: Yes ?RLE Weight Bearing: Weight bearing as tolerated  ?  ? ?Mobility ? Bed Mobility ?Overal bed mobility: Needs Assistance ?  ?  ?  ?Supine to sit: Max assist ?Sit to supine: Mod assist, +2 for physical assistance ?  ?General bed mobility comments: verbal cues for sequencing and task initiation. cues provided to family also on how to assist with bed mobility with tips for body mechanics ?  ? ?Transfers ?Overall transfer level: Needs assistance ?Equipment used: Rolling walker (2 wheels) ?Transfers: Sit to/from Stand ?Sit to Stand: Mod assist, +2 physical assistance ?  ?  ?  ?  ?  ?General transfer comment: 4 bouts of standing performed. cues for sequencing. each daughter participated with transfer traning/practice  with therapist assisting in preparation for home mobility ?  ? ?Ambulation/Gait ?  ?  ?  ?  ?  ?  ?Pre-gait activities: emphasis on anterior weight shifting and gluteal squeeze with standing to facilitate upright standing posture. standing tolerance limited to 10 seconds with the first stand and decreased with subsequent stands due to fatigue with activity ?General Gait Details: unable to stand long enough to safely attempt ambulation. standing tolerance ~ 10 seconds ? ? ?Stairs ?  ?  ?  ?  ?  ? ? ?Wheelchair Mobility ?  ? ?Modified Rankin (Stroke Patients Only) ?  ? ? ?  ?Balance Overall balance assessment: Needs assistance ?Sitting-balance support: No upper extremity supported, Feet supported ?Sitting balance-Leahy Scale: Fair ?Sitting balance - Comments: increased left lean more pronounced with fatigue. cues for upright and midline posture. head and neck appear stiff with sitting upright. gentle self neck and shoulder range of motion performed  with cues for technique in pain  free range. daughters assisted with bathing patient in sitting position and therapist reinforced upright sitting posture ?  ?Standing balance support: Bilateral upper extremity supported ?Standing balance-Leahy Scale: Poor ?Standing balance comment: patient required rolling walker for support and +2 person for safety ?  ?  ?  ?  ?  ?  ?  ?  ?  ?  ?  ?  ? ?  ?Cognition Arousal/Alertness: Awake/alert ?Behavior During Therapy: [facility] for tasks assessed/performed ?Overall Cognitive Status: Within Functional Limits for tasks assessed ?  ?  ?  ?  ?  ?  ?  ?  ?  ?  ?  ?  ?  ?  ?  ?  ?General Comments: daughter has declined interpreter. ?  ?  ? ?  ?Exercises General Exercises - Lower Extremity ?Ankle Circles/Pumps: AROM, Strengthening, Both, 10 reps, Supine, Seated (10 reps sitting, 10 reps supine) ?Long Arc Quad: AAROM, Strengthening, Both, 10 reps, Seated ?Heel Slides: AAROM, Strengthening, Both, 10 reps, Supine ?Hip ABduction/ADduction: AAROM, Strengthening, Both, 10 reps, Supine ?Other Exercises ?Other Exercises: cues for exercise technique ? ?  ?General Comments General comments (skin integrity, edema, etc.): patient initially reports feeling shortness of breath with sitting upright. Sp02 in the 90's on room air. ?  ?  ? ?Pertinent Vitals/Pain Pain Assessment ?Pain Assessment: 0-10 ?Faces Pain Scale: Hurts even more ?Pain Location: right hip ?Pain Descriptors / Indicators: Sore, Grimacing, Guarding ?Pain Intervention(s): Limited activity within patient's tolerance, Repositioned  ? ? ?Home Living   ?  ?  ?  ?  ?  ?  ?  ?  ?  ?   ?  ?Prior Function    ?  ?  ?   ? ?PT Goals (current goals can now be found in the care plan section) Acute Rehab PT Goals ?Patient Stated Goal: have less pain ?PT Goal Formulation: With family ?Time For Goal Achievement: 05/11/21 ?Potential to Achieve Goals: Good ?Progress towards PT goals: Progressing toward goals ? ?  ?Frequency ? ? ? 7X/week ? ? ? ?  ?PT Plan Current plan remains appropriate   ? ? ?Co-evaluation   ?  ?  ?  ?  ? ?  ?AM-PAC PT "6 Clicks" Mobility   ?Outcome Measure ? Help needed turning from your back to your side while in a flat bed without using bedrails?: A Lot ?Help needed moving from lying on your back to sitting on the side of a flat bed without using bedrails?: A Lot ?Help needed moving to and from a bed to a chair (including a wheelchair)?: Total ?Help needed standing up from a chair using your arms (e.g., wheelchair or bedside chair)?: A Lot ?Help needed to walk in hospital room?: Total ?Help needed climbing 3-5 steps with a railing? : Total ?6 Click Score: 9 ? ?  ?End of Session Equipment Utilized During Treatment: Gait belt ?Activity Tolerance: Patient tolerated treatment well ?Patient left: in bed;with call bell/phone within reach;with bed alarm set;with family/visitor present ?Nurse Communication: Mobility status ?PT Visit Diagnosis: Unsteadiness on feet (R26.81);Muscle weakness (generalized) (M62.81);History of falling (Z91.81);Difficulty in walking, not elsewhere classified (R26.2);Pain ?Pain - Right/Left: Right ?Pain - part of body: Knee;Hip ?  ? ? ?Time: 7026-3785 ?PT Time Calculation (min) (ACUTE ONLY): 60 min ? ?Charges:  $Therapeutic Exercise: 23-37 mins ?$Therapeutic Activity: 23-37 mins          ?          ? ?French Ana  Roxan Hockey, PT, MPT ? ? ? ?Ina Homes ?05/02/2021, 4:13 PM ? ?

## 2021-05-02 NOTE — Progress Notes (Signed)
[facility] [employer] [facility]) Hospital Liaison note: ? ?This is a pending outpatient-based Palliative Care patient. Will continue to follow for disposition. ? ?Please call with any outpatient palliative questions or concerns. ? ?Thank you, ?Abran Cantor, LPN ?[facility] Hospital Liaison ?[phone] ? ?

## 2021-05-02 NOTE — Discharge Instructions (Signed)

## 2021-05-03 ENCOUNTER — Telehealth: Payer: Self-pay

## 2021-05-03 DIAGNOSIS — I119 Hypertensive heart disease without heart failure: Secondary | ICD-10-CM | POA: Diagnosis not present

## 2021-05-03 DIAGNOSIS — G3184 Mild cognitive impairment, so stated: Secondary | ICD-10-CM | POA: Diagnosis not present

## 2021-05-03 DIAGNOSIS — I480 Paroxysmal atrial fibrillation: Secondary | ICD-10-CM | POA: Diagnosis not present

## 2021-05-03 DIAGNOSIS — I6529 Occlusion and stenosis of unspecified carotid artery: Secondary | ICD-10-CM | POA: Diagnosis not present

## 2021-05-03 DIAGNOSIS — D649 Anemia, unspecified: Secondary | ICD-10-CM | POA: Diagnosis not present

## 2021-05-03 DIAGNOSIS — E119 Type 2 diabetes mellitus without complications: Secondary | ICD-10-CM | POA: Diagnosis not present

## 2021-05-03 DIAGNOSIS — I2510 Atherosclerotic heart disease of native coronary artery without angina pectoris: Secondary | ICD-10-CM | POA: Diagnosis not present

## 2021-05-03 DIAGNOSIS — M170 Bilateral primary osteoarthritis of knee: Secondary | ICD-10-CM | POA: Diagnosis not present

## 2021-05-03 DIAGNOSIS — S72141D Displaced intertrochanteric fracture of right femur, subsequent encounter for closed fracture with routine healing: Secondary | ICD-10-CM | POA: Diagnosis not present

## 2021-05-03 DIAGNOSIS — E039 Hypothyroidism, unspecified: Secondary | ICD-10-CM | POA: Diagnosis not present

## 2021-05-03 NOTE — Telephone Encounter (Signed)
Transition Care Management Follow-up Telephone Call ?Date of discharge and from where: 05/02/2021 from [facility] ?How have you been since you were released from the hospital? Spoke to patient daughter, [name], and she stated that patient is feeling well. [name] did not have any questions or concerns at this time.  ?Any questions or concerns? No ? ?Items Reviewed: ?Did the pt receive and understand the discharge instructions provided? Yes  ?Medications obtained and verified? Yes  ?Other? No  ?Any new allergies since your discharge? No  ?Dietary orders reviewed? No ?Do you have support at home? Yes  ? ?Functional Questionnaire: (I = Independent and D = Dependent) ?ADLs: D ? ?Bathing/Dressing- D ? ?Meal Prep- D ? ?Eating- D ? ?Maintaining continence- D ? ?Transferring/Ambulation- D ? ?Managing Meds- D ? ? ?Follow up appointments reviewed: ? ?PCP Hospital f/u appt confirmed? No   ?Specialist Hospital f/u appt confirmed? No  Follow up with Ortho to be scheduled.  ?Are transportation arrangements needed? No  ?If their condition worsens, is the pt aware to call PCP or go to the Emergency Dept.? Yes ?Was the patient provided with contact information for the PCP's office or ED? Yes ?Was to pt encouraged to call back with questions or concerns? Yes ? ?

## 2021-05-06 ENCOUNTER — Telehealth: Payer: Self-pay

## 2021-05-06 DIAGNOSIS — E039 Hypothyroidism, unspecified: Secondary | ICD-10-CM | POA: Diagnosis not present

## 2021-05-06 DIAGNOSIS — D649 Anemia, unspecified: Secondary | ICD-10-CM | POA: Diagnosis not present

## 2021-05-06 DIAGNOSIS — I480 Paroxysmal atrial fibrillation: Secondary | ICD-10-CM | POA: Diagnosis not present

## 2021-05-06 DIAGNOSIS — I119 Hypertensive heart disease without heart failure: Secondary | ICD-10-CM | POA: Diagnosis not present

## 2021-05-06 DIAGNOSIS — I2510 Atherosclerotic heart disease of native coronary artery without angina pectoris: Secondary | ICD-10-CM | POA: Diagnosis not present

## 2021-05-06 DIAGNOSIS — I6529 Occlusion and stenosis of unspecified carotid artery: Secondary | ICD-10-CM | POA: Diagnosis not present

## 2021-05-06 DIAGNOSIS — M170 Bilateral primary osteoarthritis of knee: Secondary | ICD-10-CM | POA: Diagnosis not present

## 2021-05-06 DIAGNOSIS — S72141D Displaced intertrochanteric fracture of right femur, subsequent encounter for closed fracture with routine healing: Secondary | ICD-10-CM | POA: Diagnosis not present

## 2021-05-06 DIAGNOSIS — G3184 Mild cognitive impairment, so stated: Secondary | ICD-10-CM | POA: Diagnosis not present

## 2021-05-06 DIAGNOSIS — E119 Type 2 diabetes mellitus without complications: Secondary | ICD-10-CM | POA: Diagnosis not present

## 2021-05-06 NOTE — Telephone Encounter (Signed)
Judeth Cornfield, OT with [facility] HH asking for verbal orders for 1 x week for 6 weeks. [phone] ?

## 2021-05-06 NOTE — Telephone Encounter (Signed)
Agree with this. Thanks.  

## 2021-05-07 ENCOUNTER — Telehealth: Payer: Self-pay | Admitting: Student

## 2021-05-07 DIAGNOSIS — I480 Paroxysmal atrial fibrillation: Secondary | ICD-10-CM | POA: Diagnosis not present

## 2021-05-07 DIAGNOSIS — I2510 Atherosclerotic heart disease of native coronary artery without angina pectoris: Secondary | ICD-10-CM | POA: Diagnosis not present

## 2021-05-07 DIAGNOSIS — G3184 Mild cognitive impairment, so stated: Secondary | ICD-10-CM | POA: Diagnosis not present

## 2021-05-07 DIAGNOSIS — D649 Anemia, unspecified: Secondary | ICD-10-CM | POA: Diagnosis not present

## 2021-05-07 DIAGNOSIS — E039 Hypothyroidism, unspecified: Secondary | ICD-10-CM | POA: Diagnosis not present

## 2021-05-07 DIAGNOSIS — I119 Hypertensive heart disease without heart failure: Secondary | ICD-10-CM | POA: Diagnosis not present

## 2021-05-07 DIAGNOSIS — I6529 Occlusion and stenosis of unspecified carotid artery: Secondary | ICD-10-CM | POA: Diagnosis not present

## 2021-05-07 DIAGNOSIS — S72141D Displaced intertrochanteric fracture of right femur, subsequent encounter for closed fracture with routine healing: Secondary | ICD-10-CM | POA: Diagnosis not present

## 2021-05-07 DIAGNOSIS — E119 Type 2 diabetes mellitus without complications: Secondary | ICD-10-CM | POA: Diagnosis not present

## 2021-05-07 DIAGNOSIS — M170 Bilateral primary osteoarthritis of knee: Secondary | ICD-10-CM | POA: Diagnosis not present

## 2021-05-07 NOTE — Telephone Encounter (Signed)
[name], Admission Family Liaison, rec'd a call from patient's daughter [name] and she discussed Palliative services with her and scheduled an In-home Palliative Consult for 05/15/21 @ 2 PM. ?

## 2021-05-07 NOTE — Telephone Encounter (Signed)
I spoke with [name] (DPR signed) and she said she has to return to work and she said palliative care and others have advised [name] to get PCP to request a caregiver for pt to do home assist and stay with pt while [name] works. Pt does have [employer] and medicaid. I asked pt if she had spoken with medicaid about pts benefits for an in home care giver and she said no that Dr Darnell Level has to start process. Sending note to Dr Darnell Level and Lattie Haw CMA. ?

## 2021-05-07 NOTE — Telephone Encounter (Signed)
Spoke with Stephanie informing her Dr. G is giving verbal orders for services requested for pt.  

## 2021-05-08 ENCOUNTER — Telehealth: Payer: Self-pay

## 2021-05-08 ENCOUNTER — Telehealth: Payer: Self-pay | Admitting: Family Medicine

## 2021-05-08 DIAGNOSIS — I6529 Occlusion and stenosis of unspecified carotid artery: Secondary | ICD-10-CM | POA: Diagnosis not present

## 2021-05-08 DIAGNOSIS — E118 Type 2 diabetes mellitus with unspecified complications: Secondary | ICD-10-CM

## 2021-05-08 DIAGNOSIS — I480 Paroxysmal atrial fibrillation: Secondary | ICD-10-CM | POA: Diagnosis not present

## 2021-05-08 DIAGNOSIS — E119 Type 2 diabetes mellitus without complications: Secondary | ICD-10-CM | POA: Diagnosis not present

## 2021-05-08 DIAGNOSIS — I119 Hypertensive heart disease without heart failure: Secondary | ICD-10-CM | POA: Diagnosis not present

## 2021-05-08 DIAGNOSIS — I2510 Atherosclerotic heart disease of native coronary artery without angina pectoris: Secondary | ICD-10-CM | POA: Diagnosis not present

## 2021-05-08 DIAGNOSIS — M170 Bilateral primary osteoarthritis of knee: Secondary | ICD-10-CM | POA: Diagnosis not present

## 2021-05-08 DIAGNOSIS — G3184 Mild cognitive impairment, so stated: Secondary | ICD-10-CM | POA: Diagnosis not present

## 2021-05-08 DIAGNOSIS — Z794 Long term (current) use of insulin: Secondary | ICD-10-CM

## 2021-05-08 DIAGNOSIS — E039 Hypothyroidism, unspecified: Secondary | ICD-10-CM | POA: Diagnosis not present

## 2021-05-08 DIAGNOSIS — S72141D Displaced intertrochanteric fracture of right femur, subsequent encounter for closed fracture with routine healing: Secondary | ICD-10-CM | POA: Diagnosis not present

## 2021-05-08 DIAGNOSIS — D649 Anemia, unspecified: Secondary | ICD-10-CM | POA: Diagnosis not present

## 2021-05-08 MED ORDER — PEN NEEDLES 32G X 4 MM MISC: 3 refills | Status: DC

## 2021-05-08 MED ORDER — GLUCOSE BLOOD VI STRP: ORAL_STRIP | 3 refills | Status: DC

## 2021-05-08 MED ORDER — LANCETS 30G MISC: 3 refills | Status: DC

## 2021-05-08 NOTE — Telephone Encounter (Signed)
First, such a nice lady and family.  I am really so sorry to hear that she had such a major fracture.   ? ?Since he will be following her for her fracture follow-ups, it will probably be easier for him to coordinate and consolidate her care.  He and his team do any and all injections all of the time, so I do think that makes the most sense.  In my opinion, it is always better for a patient's medical care to be consolidated under one team. ?

## 2021-05-08 NOTE — Telephone Encounter (Signed)
[name] with [facility] home health has returned the phone call, no one was available to speak please give a call back or leave voicemail  ?

## 2021-05-08 NOTE — Telephone Encounter (Signed)
[facility] called, requesting prescriptions for lancets, needles, and test strips ? ?Please send three separate scripts for generic of each and they will determine which one is available, Pharmacist states it is easier to change the script to one that insurance covers when we send scripts separately ? ?[id number] [place], [place] - [address] Phone:  [phone]  ?Fax:  [phone]  ?  ? ?

## 2021-05-08 NOTE — Telephone Encounter (Signed)
Please refer to extensive my chart messages detailing conversation around this concern.   ?

## 2021-05-08 NOTE — Telephone Encounter (Signed)
Printed form.  Placed in Dr. Timoteo Expose box.  ?

## 2021-05-08 NOTE — Telephone Encounter (Signed)
Left message on voicemail to call the office back. 

## 2021-05-08 NOTE — Telephone Encounter (Signed)
SN Verbal orders for 2 x week for 2 weeks, 1 x a week for  4 weeks, and PRN. ? ? ? ? ?

## 2021-05-08 NOTE — Telephone Encounter (Signed)
Agree with this. Thanks.  

## 2021-05-08 NOTE — Telephone Encounter (Signed)
Dr. Lorelei Pont,  ? ?Please see the message that Ms. Syslo daughter sent me a few days ago.  ? ?As Dr. Roland Rack is following her extensively from an orthopedic perspective and he has a specialized team that can coordinate the knee injections as well, do you feel comfortable with deferring the knee injections under his care as well at this point?  ? ?Seems that streamlining this aspect of care, now that there are multiple orthopedic issues going on, makes most sense. ? ?Let me know your thoughts and I will get back with patient and daughter. ? ?Thanks.  ?

## 2021-05-08 NOTE — Telephone Encounter (Signed)
E-scribed rxs for generic lancets, pen needles and test strips.  ?

## 2021-05-08 NOTE — Telephone Encounter (Signed)
Filled and in Lisa's box 

## 2021-05-09 DIAGNOSIS — I119 Hypertensive heart disease without heart failure: Secondary | ICD-10-CM | POA: Diagnosis not present

## 2021-05-09 DIAGNOSIS — E119 Type 2 diabetes mellitus without complications: Secondary | ICD-10-CM | POA: Diagnosis not present

## 2021-05-09 DIAGNOSIS — G3184 Mild cognitive impairment, so stated: Secondary | ICD-10-CM | POA: Diagnosis not present

## 2021-05-09 DIAGNOSIS — I480 Paroxysmal atrial fibrillation: Secondary | ICD-10-CM | POA: Diagnosis not present

## 2021-05-09 DIAGNOSIS — S72141D Displaced intertrochanteric fracture of right femur, subsequent encounter for closed fracture with routine healing: Secondary | ICD-10-CM | POA: Diagnosis not present

## 2021-05-09 DIAGNOSIS — D649 Anemia, unspecified: Secondary | ICD-10-CM | POA: Diagnosis not present

## 2021-05-09 DIAGNOSIS — I6529 Occlusion and stenosis of unspecified carotid artery: Secondary | ICD-10-CM | POA: Diagnosis not present

## 2021-05-09 DIAGNOSIS — I2510 Atherosclerotic heart disease of native coronary artery without angina pectoris: Secondary | ICD-10-CM | POA: Diagnosis not present

## 2021-05-09 DIAGNOSIS — M170 Bilateral primary osteoarthritis of knee: Secondary | ICD-10-CM | POA: Diagnosis not present

## 2021-05-09 DIAGNOSIS — E039 Hypothyroidism, unspecified: Secondary | ICD-10-CM | POA: Diagnosis not present

## 2021-05-09 NOTE — Telephone Encounter (Signed)
Faxed form to [facility] at [phone]. ?

## 2021-05-09 NOTE — Telephone Encounter (Signed)
Spoke with [name] informing her Dr. Reece Agar is giving verbal orders for services requested for pt.  ?

## 2021-05-10 DIAGNOSIS — Z7984 Long term (current) use of oral hypoglycemic drugs: Secondary | ICD-10-CM

## 2021-05-10 DIAGNOSIS — S72141D Displaced intertrochanteric fracture of right femur, subsequent encounter for closed fracture with routine healing: Secondary | ICD-10-CM | POA: Diagnosis not present

## 2021-05-10 DIAGNOSIS — E785 Hyperlipidemia, unspecified: Secondary | ICD-10-CM

## 2021-05-10 DIAGNOSIS — G4700 Insomnia, unspecified: Secondary | ICD-10-CM

## 2021-05-10 DIAGNOSIS — I119 Hypertensive heart disease without heart failure: Secondary | ICD-10-CM | POA: Diagnosis not present

## 2021-05-10 DIAGNOSIS — Z9181 History of falling: Secondary | ICD-10-CM

## 2021-05-10 DIAGNOSIS — Z8673 Personal history of transient ischemic attack (TIA), and cerebral infarction without residual deficits: Secondary | ICD-10-CM

## 2021-05-10 DIAGNOSIS — I6529 Occlusion and stenosis of unspecified carotid artery: Secondary | ICD-10-CM | POA: Diagnosis not present

## 2021-05-10 DIAGNOSIS — I480 Paroxysmal atrial fibrillation: Secondary | ICD-10-CM | POA: Diagnosis not present

## 2021-05-10 DIAGNOSIS — I2510 Atherosclerotic heart disease of native coronary artery without angina pectoris: Secondary | ICD-10-CM | POA: Diagnosis not present

## 2021-05-10 DIAGNOSIS — E611 Iron deficiency: Secondary | ICD-10-CM

## 2021-05-10 DIAGNOSIS — Z7901 Long term (current) use of anticoagulants: Secondary | ICD-10-CM

## 2021-05-10 DIAGNOSIS — E039 Hypothyroidism, unspecified: Secondary | ICD-10-CM | POA: Diagnosis not present

## 2021-05-10 DIAGNOSIS — E119 Type 2 diabetes mellitus without complications: Secondary | ICD-10-CM | POA: Diagnosis not present

## 2021-05-10 DIAGNOSIS — D649 Anemia, unspecified: Secondary | ICD-10-CM | POA: Diagnosis not present

## 2021-05-10 DIAGNOSIS — Z90710 Acquired absence of both cervix and uterus: Secondary | ICD-10-CM

## 2021-05-10 DIAGNOSIS — G3184 Mild cognitive impairment, so stated: Secondary | ICD-10-CM | POA: Diagnosis not present

## 2021-05-10 DIAGNOSIS — Z993 Dependence on wheelchair: Secondary | ICD-10-CM

## 2021-05-10 DIAGNOSIS — D72829 Elevated white blood cell count, unspecified: Secondary | ICD-10-CM

## 2021-05-10 DIAGNOSIS — Z794 Long term (current) use of insulin: Secondary | ICD-10-CM

## 2021-05-10 DIAGNOSIS — M170 Bilateral primary osteoarthritis of knee: Secondary | ICD-10-CM | POA: Diagnosis not present

## 2021-05-12 NOTE — Telephone Encounter (Signed)
Noted, I have sent daughter a message through patient's my chart making her aware.  ?

## 2021-05-13 DIAGNOSIS — M170 Bilateral primary osteoarthritis of knee: Secondary | ICD-10-CM | POA: Diagnosis not present

## 2021-05-13 DIAGNOSIS — E119 Type 2 diabetes mellitus without complications: Secondary | ICD-10-CM | POA: Diagnosis not present

## 2021-05-13 DIAGNOSIS — I6529 Occlusion and stenosis of unspecified carotid artery: Secondary | ICD-10-CM | POA: Diagnosis not present

## 2021-05-13 DIAGNOSIS — I480 Paroxysmal atrial fibrillation: Secondary | ICD-10-CM | POA: Diagnosis not present

## 2021-05-13 DIAGNOSIS — G3184 Mild cognitive impairment, so stated: Secondary | ICD-10-CM | POA: Diagnosis not present

## 2021-05-13 DIAGNOSIS — I119 Hypertensive heart disease without heart failure: Secondary | ICD-10-CM | POA: Diagnosis not present

## 2021-05-13 DIAGNOSIS — E039 Hypothyroidism, unspecified: Secondary | ICD-10-CM | POA: Diagnosis not present

## 2021-05-13 DIAGNOSIS — I2510 Atherosclerotic heart disease of native coronary artery without angina pectoris: Secondary | ICD-10-CM | POA: Diagnosis not present

## 2021-05-13 DIAGNOSIS — S72141D Displaced intertrochanteric fracture of right femur, subsequent encounter for closed fracture with routine healing: Secondary | ICD-10-CM | POA: Diagnosis not present

## 2021-05-13 DIAGNOSIS — D649 Anemia, unspecified: Secondary | ICD-10-CM | POA: Diagnosis not present

## 2021-05-15 ENCOUNTER — Other Ambulatory Visit: Payer: 59 | Admitting: Student

## 2021-05-15 ENCOUNTER — Other Ambulatory Visit: Payer: Self-pay

## 2021-05-15 DIAGNOSIS — R52 Pain, unspecified: Secondary | ICD-10-CM

## 2021-05-15 DIAGNOSIS — Z515 Encounter for palliative care: Secondary | ICD-10-CM

## 2021-05-15 DIAGNOSIS — G3184 Mild cognitive impairment, so stated: Secondary | ICD-10-CM | POA: Diagnosis not present

## 2021-05-15 DIAGNOSIS — K5900 Constipation, unspecified: Secondary | ICD-10-CM

## 2021-05-15 DIAGNOSIS — I480 Paroxysmal atrial fibrillation: Secondary | ICD-10-CM | POA: Diagnosis not present

## 2021-05-15 DIAGNOSIS — S72141A Displaced intertrochanteric fracture of right femur, initial encounter for closed fracture: Secondary | ICD-10-CM | POA: Diagnosis not present

## 2021-05-15 DIAGNOSIS — M170 Bilateral primary osteoarthritis of knee: Secondary | ICD-10-CM | POA: Diagnosis not present

## 2021-05-15 DIAGNOSIS — M25551 Pain in right hip: Secondary | ICD-10-CM | POA: Diagnosis not present

## 2021-05-15 DIAGNOSIS — E039 Hypothyroidism, unspecified: Secondary | ICD-10-CM | POA: Diagnosis not present

## 2021-05-15 DIAGNOSIS — D649 Anemia, unspecified: Secondary | ICD-10-CM | POA: Diagnosis not present

## 2021-05-15 DIAGNOSIS — S72141D Displaced intertrochanteric fracture of right femur, subsequent encounter for closed fracture with routine healing: Secondary | ICD-10-CM | POA: Diagnosis not present

## 2021-05-15 DIAGNOSIS — R531 Weakness: Secondary | ICD-10-CM

## 2021-05-15 DIAGNOSIS — I6529 Occlusion and stenosis of unspecified carotid artery: Secondary | ICD-10-CM | POA: Diagnosis not present

## 2021-05-15 DIAGNOSIS — I119 Hypertensive heart disease without heart failure: Secondary | ICD-10-CM | POA: Diagnosis not present

## 2021-05-15 DIAGNOSIS — E119 Type 2 diabetes mellitus without complications: Secondary | ICD-10-CM | POA: Diagnosis not present

## 2021-05-15 DIAGNOSIS — E118 Type 2 diabetes mellitus with unspecified complications: Secondary | ICD-10-CM

## 2021-05-15 DIAGNOSIS — I2510 Atherosclerotic heart disease of native coronary artery without angina pectoris: Secondary | ICD-10-CM | POA: Diagnosis not present

## 2021-05-15 NOTE — Progress Notes (Signed)
? ? ?Manufacturing engineer ?Community Palliative Care Consult Note ?Telephone: [phone]  ?Fax: [phone]  ? ?Date of encounter: 05/15/21 ?2:04 PM ?PATIENT NAME: [name] ?[place] ?[place] [address]   ?[phone] (home)  ?DOB: August 17, 1934 ?MRN: [id number] ?PRIMARY CARE PROVIDER:    ?Ria Bush, MD,  ?[address] [place] [address] ?[phone] ? ?REFERRING PROVIDER:   ?Ria Bush, MD ?[address] ?[place],  [place] [address] ?[phone] ? ?RESPONSIBLE PARTY:    ?Contact Information   ? ? Name Relation Home Work Mobile  ? Leach,Laura Daughter [phone]    ? Tommy Rainwater [phone]    ? ?  ? ? ? ?I met face to face with patient and family in the home. Palliative Care was asked to follow this patient by consultation request of  Ria Bush, MD to address advance care planning and complex medical decision making. This is the initial visit.  ? ? ?                                 ASSESSMENT AND PLAN / RECOMMENDATIONS:  ? ?Advance Care Planning/Goals of Care: Goals include to maximize quality of life and symptom management. Patient/health care surrogate gave his/her permission to discuss.Our advance care planning conversation included a discussion about:    ?The value and importance of advance care planning  ?Experiences with loved ones who have been seriously ill or have died  ?Exploration of personal, cultural or spiritual beliefs that might influence medical decisions  ?Exploration of goals of care in the event of a sudden injury or illness  ?Daughter Dalphine Handing is the HCPOA ?Decision not to resuscitate or to de-escalate disease focused treatments due to poor prognosis. ?CODE STATUS: DNR ? ?Education on palliative medicine vs. Hospice services. Family would like for patient to continue therapy, improve physically. Receive additional caregiver support in the home. Family is open to iron infusion, blood transfusion as needed.   ? ? ?Symptom Management/Plan: ? ?Generalized weakness- Continue HH therapy as directed. Monitor for falls/safety. Application has been completed for additional caregiver support with [employer]. Will refer to palliative SW to see if she can f/u on application.  ? ?T2DM-hemoglobin A1c 8.3 04/2021. Continue to check blood sugars BID; metformin and glargine insulin as directed per PCP. Encourage HS snack to prevent hypoglycemia in the mornings.  ? ?Pain- due to right hip fracture, arthritis. Continue norco 5-325 mg every 6 hours PRN, acetaminophen PRN. Education on max dose of 3000 mg/daily of acetaminophen. ? ?Constipation- miralax 17 gm daily. Diet with fiber encouraged.  ? ?Follow up Palliative Care Visit: Palliative care will continue to follow for complex medical decision making, advance care planning, and clarification of goals. Return in 4-6 weeks or prn. ? ?I spent 60 minutes providing this consultation. More than 50% of the time in this consultation was spent in counseling and care coordination. ? ? ?PPS: 40% ? ?HOSPICE ELIGIBILITY/DIAGNOSIS: TBD ? ?Chief Complaint: Palliative Medicine initial visit.  ? ?HISTORY OF PRESENT ILLNESS:  [name] is a 87 y.o. year old female  with atrial fibrillation, T2DM, hypertension, hypothyroidism, arthritis, hx of CVA. Patient recently hospitalized 2/23-05/2021 due to closed right hip fracture with repair, atrial fibrillation, physical deconditioning, iron deficiency anemia. ? ?Patient resides at home with family. She was ambulatory prior to fall. She currently has [facility] therapy, SN coming in. Denies pain at present; does complain  of generalized and right hip pain. Denies shortness of breath, nausea. Endorses constipation. Appetite is good; one Ensure daily. Checking blood sugars BID; range from 90's-200 mg/dL. Daughter states patient is actually 87 years old. ? ?History obtained from review of EMR, discussion with primary team, and interview with family,  facility staff/caregiver and/or Ms. Vater.  ?I reviewed available labs, medications, imaging, studies and related documents from the EMR.  Records reviewed and summarized above.  ? ?ROS ? ?General: NAD ?EYES: denies vision changes ?ENMT: denies dysphagia ?Cardiovascular: denies chest pain, denies DOE ?Pulmonary: denies cough, denies increased SOB ?Abdomen: endorses good appetite, + constipation ?GU: denies dysuria ?MSK:  +weakness ?Skin: denies rashes or wounds ?Neurological: denies pain, denies insomnia ?Psych: Endorses stable mood ?Heme/lymph/immuno: denies bruises, abnormal bleeding ? ?Physical Exam: ?Pulse 68, b/p 118/64 ?Constitutional: NAD ?General: frail appearing, thin ?EYES: anicteric sclera, lids intact, no discharge  ?ENMT: intact hearing, oral mucous membranes moist ?CV: S1S2, RRR, no LE edema ?Pulmonary: LCTA, no increased work of breathing, no cough, room air ?Abdomen: normo-active BS + 4 quadrants, soft and non tender, no ascites ?GU: deferred ?MSK: moves all extremities ?Skin: warm and dry, surgical wound right hip, mild edema, no erythema or signs of infection ?Neuro:  + generalized weakness, A & O x 3 ?Psych: non-anxious affect, pleasant ?Hem/lymph/immuno: no widespread bruising ?CURRENT PROBLEM LIST:  ?Patient Active Problem List  ? Diagnosis Date Noted  ? Closed right hip fracture ([place]) 05/01/2021  ? Hematoma of arm, right, subsequent encounter 04/29/2021  ? Leukocytosis 04/26/2021  ? Physical deconditioning 04/25/2021  ? General weakness 04/13/2021  ? Recurrent falls 04/20/2020  ? MCI (mild cognitive impairment) 05/13/2019  ? Anemia 08/12/2017  ? Weight loss 08/10/2017  ? Encounter for general adult medical examination with abnormal findings 01/26/2016  ? Advanced care planning/counseling discussion 01/26/2016  ? Insomnia 01/26/2016  ? Iron deficiency 01/26/2016  ? Carotid stenosis 10/25/2015  ? Osteopenia 10/25/2015  ? Primary osteoarthritis of both knees 10/24/2015  ? Type 2 diabetes mellitus  with complication, with long-term current use of insulin ([place]) 10/24/2015  ? Corns and callus 10/24/2015  ? Hypothyroidism   ? History of ischemic stroke without residual deficits 03/03/2012  ? Atrial fibrillation ([place]) 03/03/2012  ? ?PAST MEDICAL HISTORY:  ?Active Ambulatory Problems  ?  Diagnosis Date Noted  ? Primary osteoarthritis of both knees 10/24/2015  ? Type 2 diabetes mellitus with complication, with long-term current use of insulin ([place]) 10/24/2015  ? History of ischemic stroke without residual deficits 03/03/2012  ? Hypothyroidism   ? Corns and callus 10/24/2015  ? Carotid stenosis 10/25/2015  ? Osteopenia 10/25/2015  ? Atrial fibrillation ([place]) 03/03/2012  ? Encounter for general adult medical examination with abnormal findings 01/26/2016  ? Advanced care planning/counseling discussion 01/26/2016  ? Insomnia 01/26/2016  ? Iron deficiency 01/26/2016  ? Weight loss 08/10/2017  ? Anemia 08/12/2017  ? MCI (mild cognitive impairment) 05/13/2019  ? Recurrent falls 04/20/2020  ? General weakness 04/13/2021  ? Physical deconditioning 04/25/2021  ? Leukocytosis 04/26/2021  ? Hematoma of arm, right, subsequent encounter 04/29/2021  ? Closed right hip fracture ([place]) 05/01/2021  ? ?Resolved Ambulatory Problems  ?  Diagnosis Date Noted  ? Dyslipidemia   ? Urinary urgency 04/20/2020  ? ?Past Medical History:  ?Diagnosis Date  ? Allergy   ? Bilateral primary osteoarthritis of knee   ? Diabetes mellitus without complication ([place]) 3825  ? Heart disease   ? Hepatitis   ? HLD (hyperlipidemia)   ?  Hypertension   ? Ischemic stroke ([place]) 2014  ? Rheumatic fever   ? Wears dentures   ? ?SOCIAL HX:  ?Social History  ? ?Tobacco Use  ? Smoking status: Never  ? Smokeless tobacco: Never  ?Substance Use Topics  ? Alcohol use: No  ? ?FAMILY HX: No family history on file.   ? ?ALLERGIES:  ?Allergies  ?Allergen Reactions  ? Chloramphenicols Swelling  ?  Moderate facial swelling  ? Influenza Vaccines Other (See Comments)  ?  Bad  reaction  ?   ?PERTINENT MEDICATIONS:  ?Outpatient Encounter Medications as of 05/15/2021  ?Medication Sig  ? atenolol (TENORMIN) 25 MG tablet TAKE 1 TABLET (25 MG TOTAL) BY MOUTH DAILY.  ? Bioflavonoid Products (BIOFLE

## 2021-05-16 DIAGNOSIS — I2510 Atherosclerotic heart disease of native coronary artery without angina pectoris: Secondary | ICD-10-CM | POA: Diagnosis not present

## 2021-05-16 DIAGNOSIS — E119 Type 2 diabetes mellitus without complications: Secondary | ICD-10-CM | POA: Diagnosis not present

## 2021-05-16 DIAGNOSIS — D649 Anemia, unspecified: Secondary | ICD-10-CM | POA: Diagnosis not present

## 2021-05-16 DIAGNOSIS — I119 Hypertensive heart disease without heart failure: Secondary | ICD-10-CM | POA: Diagnosis not present

## 2021-05-16 DIAGNOSIS — I6529 Occlusion and stenosis of unspecified carotid artery: Secondary | ICD-10-CM | POA: Diagnosis not present

## 2021-05-16 DIAGNOSIS — E039 Hypothyroidism, unspecified: Secondary | ICD-10-CM | POA: Diagnosis not present

## 2021-05-16 DIAGNOSIS — S72141D Displaced intertrochanteric fracture of right femur, subsequent encounter for closed fracture with routine healing: Secondary | ICD-10-CM | POA: Diagnosis not present

## 2021-05-16 DIAGNOSIS — M170 Bilateral primary osteoarthritis of knee: Secondary | ICD-10-CM | POA: Diagnosis not present

## 2021-05-16 DIAGNOSIS — G3184 Mild cognitive impairment, so stated: Secondary | ICD-10-CM | POA: Diagnosis not present

## 2021-05-16 DIAGNOSIS — I480 Paroxysmal atrial fibrillation: Secondary | ICD-10-CM | POA: Diagnosis not present

## 2021-05-17 DIAGNOSIS — M170 Bilateral primary osteoarthritis of knee: Secondary | ICD-10-CM | POA: Diagnosis not present

## 2021-05-17 DIAGNOSIS — G3184 Mild cognitive impairment, so stated: Secondary | ICD-10-CM | POA: Diagnosis not present

## 2021-05-17 DIAGNOSIS — I2510 Atherosclerotic heart disease of native coronary artery without angina pectoris: Secondary | ICD-10-CM | POA: Diagnosis not present

## 2021-05-17 DIAGNOSIS — E039 Hypothyroidism, unspecified: Secondary | ICD-10-CM | POA: Diagnosis not present

## 2021-05-17 DIAGNOSIS — S72141D Displaced intertrochanteric fracture of right femur, subsequent encounter for closed fracture with routine healing: Secondary | ICD-10-CM | POA: Diagnosis not present

## 2021-05-17 DIAGNOSIS — I480 Paroxysmal atrial fibrillation: Secondary | ICD-10-CM | POA: Diagnosis not present

## 2021-05-17 DIAGNOSIS — E119 Type 2 diabetes mellitus without complications: Secondary | ICD-10-CM | POA: Diagnosis not present

## 2021-05-17 DIAGNOSIS — I119 Hypertensive heart disease without heart failure: Secondary | ICD-10-CM | POA: Diagnosis not present

## 2021-05-17 DIAGNOSIS — I6529 Occlusion and stenosis of unspecified carotid artery: Secondary | ICD-10-CM | POA: Diagnosis not present

## 2021-05-17 DIAGNOSIS — D649 Anemia, unspecified: Secondary | ICD-10-CM | POA: Diagnosis not present

## 2021-05-20 ENCOUNTER — Telehealth: Payer: Self-pay

## 2021-05-20 DIAGNOSIS — Z515 Encounter for palliative care: Secondary | ICD-10-CM

## 2021-05-20 DIAGNOSIS — I2510 Atherosclerotic heart disease of native coronary artery without angina pectoris: Secondary | ICD-10-CM | POA: Diagnosis not present

## 2021-05-20 DIAGNOSIS — G3184 Mild cognitive impairment, so stated: Secondary | ICD-10-CM | POA: Diagnosis not present

## 2021-05-20 DIAGNOSIS — M170 Bilateral primary osteoarthritis of knee: Secondary | ICD-10-CM | POA: Diagnosis not present

## 2021-05-20 DIAGNOSIS — I480 Paroxysmal atrial fibrillation: Secondary | ICD-10-CM | POA: Diagnosis not present

## 2021-05-20 DIAGNOSIS — D649 Anemia, unspecified: Secondary | ICD-10-CM | POA: Diagnosis not present

## 2021-05-20 DIAGNOSIS — E039 Hypothyroidism, unspecified: Secondary | ICD-10-CM | POA: Diagnosis not present

## 2021-05-20 DIAGNOSIS — I6529 Occlusion and stenosis of unspecified carotid artery: Secondary | ICD-10-CM | POA: Diagnosis not present

## 2021-05-20 DIAGNOSIS — E119 Type 2 diabetes mellitus without complications: Secondary | ICD-10-CM | POA: Diagnosis not present

## 2021-05-20 DIAGNOSIS — S72141D Displaced intertrochanteric fracture of right femur, subsequent encounter for closed fracture with routine healing: Secondary | ICD-10-CM | POA: Diagnosis not present

## 2021-05-20 DIAGNOSIS — I119 Hypertensive heart disease without heart failure: Secondary | ICD-10-CM | POA: Diagnosis not present

## 2021-05-20 NOTE — Telephone Encounter (Signed)
PC SW outreached patients daughter, [name], per [facility] NP - [place]. [place], [place] referral request to assess patient needs and f/u on PCS application that was completed by a different provider. ? ?Call unsuccessful. SW LVM with contact information. Awaiting return call.  ?

## 2021-05-20 NOTE — Progress Notes (Signed)
PC SW outreached patients daughter,returning her TC to SW. ? ?Daughter share that she is aware that she needs to f/u with provider who submitted the original PCS application for patient to check on status of application. ? ?Daughter share that her other concern for patient is that patient seems to have little energy now, she thinks due to her being anemic. PCP wants to schedule a iron infusion for patient but daughter share that due to patients weakness she is unable to get out of bed.  ?Patient is receiving HH therapy, who suggested patient have bloodwork done. Daughter in agreement with this and inquired for  ?PC assistance. Daughter also concerned that [facility] will be ending soon, and patient has not gained any strength for bed mobility or to get out of the bed.  ? ?SW included PC NP of daughter request.  ?

## 2021-05-21 ENCOUNTER — Telehealth: Payer: Self-pay | Admitting: Student

## 2021-05-21 DIAGNOSIS — I6529 Occlusion and stenosis of unspecified carotid artery: Secondary | ICD-10-CM | POA: Diagnosis not present

## 2021-05-21 DIAGNOSIS — G3184 Mild cognitive impairment, so stated: Secondary | ICD-10-CM | POA: Diagnosis not present

## 2021-05-21 DIAGNOSIS — M170 Bilateral primary osteoarthritis of knee: Secondary | ICD-10-CM | POA: Diagnosis not present

## 2021-05-21 DIAGNOSIS — I119 Hypertensive heart disease without heart failure: Secondary | ICD-10-CM | POA: Diagnosis not present

## 2021-05-21 DIAGNOSIS — S72141D Displaced intertrochanteric fracture of right femur, subsequent encounter for closed fracture with routine healing: Secondary | ICD-10-CM | POA: Diagnosis not present

## 2021-05-21 DIAGNOSIS — D649 Anemia, unspecified: Secondary | ICD-10-CM | POA: Diagnosis not present

## 2021-05-21 DIAGNOSIS — I2510 Atherosclerotic heart disease of native coronary artery without angina pectoris: Secondary | ICD-10-CM | POA: Diagnosis not present

## 2021-05-21 DIAGNOSIS — I480 Paroxysmal atrial fibrillation: Secondary | ICD-10-CM | POA: Diagnosis not present

## 2021-05-21 DIAGNOSIS — E039 Hypothyroidism, unspecified: Secondary | ICD-10-CM | POA: Diagnosis not present

## 2021-05-21 DIAGNOSIS — E119 Type 2 diabetes mellitus without complications: Secondary | ICD-10-CM | POA: Diagnosis not present

## 2021-05-21 NOTE — Telephone Encounter (Signed)
Palliative NP spoke with patient's daughter. She states patient is extremely tired after working with therapy. It had been previously discussed that patient may need iron infusions. She is currently giving supplemental iron PO. HH nurse is coming in, but unable to draw labs. Will reach out to PCP to see what labs are needed and arrange for palliative RN to draw labs in near future.  ?

## 2021-05-22 DIAGNOSIS — I480 Paroxysmal atrial fibrillation: Secondary | ICD-10-CM | POA: Diagnosis not present

## 2021-05-22 DIAGNOSIS — I119 Hypertensive heart disease without heart failure: Secondary | ICD-10-CM | POA: Diagnosis not present

## 2021-05-22 DIAGNOSIS — G3184 Mild cognitive impairment, so stated: Secondary | ICD-10-CM | POA: Diagnosis not present

## 2021-05-22 DIAGNOSIS — I2510 Atherosclerotic heart disease of native coronary artery without angina pectoris: Secondary | ICD-10-CM | POA: Diagnosis not present

## 2021-05-22 DIAGNOSIS — E119 Type 2 diabetes mellitus without complications: Secondary | ICD-10-CM | POA: Diagnosis not present

## 2021-05-22 DIAGNOSIS — S72141D Displaced intertrochanteric fracture of right femur, subsequent encounter for closed fracture with routine healing: Secondary | ICD-10-CM | POA: Diagnosis not present

## 2021-05-22 DIAGNOSIS — E039 Hypothyroidism, unspecified: Secondary | ICD-10-CM | POA: Diagnosis not present

## 2021-05-22 DIAGNOSIS — D649 Anemia, unspecified: Secondary | ICD-10-CM | POA: Diagnosis not present

## 2021-05-22 DIAGNOSIS — I6529 Occlusion and stenosis of unspecified carotid artery: Secondary | ICD-10-CM | POA: Diagnosis not present

## 2021-05-22 DIAGNOSIS — M170 Bilateral primary osteoarthritis of knee: Secondary | ICD-10-CM | POA: Diagnosis not present

## 2021-05-23 DIAGNOSIS — I480 Paroxysmal atrial fibrillation: Secondary | ICD-10-CM | POA: Diagnosis not present

## 2021-05-23 DIAGNOSIS — I6529 Occlusion and stenosis of unspecified carotid artery: Secondary | ICD-10-CM | POA: Diagnosis not present

## 2021-05-23 DIAGNOSIS — M170 Bilateral primary osteoarthritis of knee: Secondary | ICD-10-CM | POA: Diagnosis not present

## 2021-05-23 DIAGNOSIS — I119 Hypertensive heart disease without heart failure: Secondary | ICD-10-CM | POA: Diagnosis not present

## 2021-05-23 DIAGNOSIS — E039 Hypothyroidism, unspecified: Secondary | ICD-10-CM | POA: Diagnosis not present

## 2021-05-23 DIAGNOSIS — I2510 Atherosclerotic heart disease of native coronary artery without angina pectoris: Secondary | ICD-10-CM | POA: Diagnosis not present

## 2021-05-23 DIAGNOSIS — S72141D Displaced intertrochanteric fracture of right femur, subsequent encounter for closed fracture with routine healing: Secondary | ICD-10-CM | POA: Diagnosis not present

## 2021-05-23 DIAGNOSIS — E119 Type 2 diabetes mellitus without complications: Secondary | ICD-10-CM | POA: Diagnosis not present

## 2021-05-23 DIAGNOSIS — D649 Anemia, unspecified: Secondary | ICD-10-CM | POA: Diagnosis not present

## 2021-05-23 DIAGNOSIS — G3184 Mild cognitive impairment, so stated: Secondary | ICD-10-CM | POA: Diagnosis not present

## 2021-05-28 ENCOUNTER — Telehealth: Payer: Self-pay

## 2021-05-28 DIAGNOSIS — R2689 Other abnormalities of gait and mobility: Secondary | ICD-10-CM | POA: Diagnosis not present

## 2021-05-28 DIAGNOSIS — I119 Hypertensive heart disease without heart failure: Secondary | ICD-10-CM | POA: Diagnosis not present

## 2021-05-28 DIAGNOSIS — R69 Illness, unspecified: Secondary | ICD-10-CM | POA: Diagnosis not present

## 2021-05-28 DIAGNOSIS — S72141D Displaced intertrochanteric fracture of right femur, subsequent encounter for closed fracture with routine healing: Secondary | ICD-10-CM | POA: Diagnosis not present

## 2021-05-28 DIAGNOSIS — G301 Alzheimer's disease with late onset: Secondary | ICD-10-CM | POA: Diagnosis not present

## 2021-05-28 DIAGNOSIS — E039 Hypothyroidism, unspecified: Secondary | ICD-10-CM | POA: Diagnosis not present

## 2021-05-28 DIAGNOSIS — S40021D Contusion of right upper arm, subsequent encounter: Secondary | ICD-10-CM | POA: Diagnosis not present

## 2021-05-28 DIAGNOSIS — M170 Bilateral primary osteoarthritis of knee: Secondary | ICD-10-CM | POA: Diagnosis not present

## 2021-05-28 DIAGNOSIS — I480 Paroxysmal atrial fibrillation: Secondary | ICD-10-CM | POA: Diagnosis not present

## 2021-05-28 DIAGNOSIS — E119 Type 2 diabetes mellitus without complications: Secondary | ICD-10-CM | POA: Diagnosis not present

## 2021-05-28 DIAGNOSIS — D649 Anemia, unspecified: Secondary | ICD-10-CM | POA: Diagnosis not present

## 2021-05-28 DIAGNOSIS — R296 Repeated falls: Secondary | ICD-10-CM | POA: Diagnosis not present

## 2021-05-28 DIAGNOSIS — G3184 Mild cognitive impairment, so stated: Secondary | ICD-10-CM | POA: Diagnosis not present

## 2021-05-28 DIAGNOSIS — I2510 Atherosclerotic heart disease of native coronary artery without angina pectoris: Secondary | ICD-10-CM | POA: Diagnosis not present

## 2021-05-28 DIAGNOSIS — D72829 Elevated white blood cell count, unspecified: Secondary | ICD-10-CM | POA: Diagnosis not present

## 2021-05-28 DIAGNOSIS — I6529 Occlusion and stenosis of unspecified carotid artery: Secondary | ICD-10-CM | POA: Diagnosis not present

## 2021-05-28 DIAGNOSIS — I639 Cerebral infarction, unspecified: Secondary | ICD-10-CM | POA: Diagnosis not present

## 2021-05-28 NOTE — Telephone Encounter (Signed)
915 am.   Request received from [facility], NP to contact PCP office regarding blood work.  Daughter is concerned patient is extremely fatigued.  There was previous discussion about patient being anemic and needing iron infusions. Phone call made to PCP office to advise of above and see if blood work is needed.  ?

## 2021-05-28 NOTE — Telephone Encounter (Signed)
Julie-Palliative care ?[phone] (secure line) ? ?Raynelle Fanning called to see if MD would authorize verbal orders lab work for this patient for weakness, anemic, possible iron infusion ? ?Please call Raynelle Fanning at the number above ?

## 2021-05-28 NOTE — Telephone Encounter (Signed)
[name] notified as instructed by telephone and verbalized understanding. ?[name] stated that they would not do the iron infusion and the daughter would have to take her mom out. ?[name] stated that they will have the blood work done and will fax the results to Dr. Sharen Hones ?

## 2021-05-28 NOTE — Telephone Encounter (Signed)
Orders received for blood work from PCP office.  Spoke with daughter [name] and visit scheduled for tomorrow at 10 am.  ?

## 2021-05-28 NOTE — Telephone Encounter (Addendum)
Agree with labs and iron infusion if able - what iron infusion is available through palliative care? Feraheme, venofer?  ? ?Would check CBC, BMP, iron panel with ferritin, vit b12.  ?Would also make sure she was holding b12 as her levels were markedly high.  ?

## 2021-05-29 ENCOUNTER — Other Ambulatory Visit: Payer: Self-pay | Admitting: Family Medicine

## 2021-05-29 ENCOUNTER — Other Ambulatory Visit: Payer: 59

## 2021-05-29 ENCOUNTER — Other Ambulatory Visit: Payer: Self-pay

## 2021-05-29 ENCOUNTER — Telehealth: Payer: Self-pay | Admitting: Family Medicine

## 2021-05-29 DIAGNOSIS — D510 Vitamin B12 deficiency anemia due to intrinsic factor deficiency: Secondary | ICD-10-CM | POA: Diagnosis not present

## 2021-05-29 DIAGNOSIS — D649 Anemia, unspecified: Secondary | ICD-10-CM | POA: Diagnosis not present

## 2021-05-29 DIAGNOSIS — D509 Iron deficiency anemia, unspecified: Secondary | ICD-10-CM | POA: Diagnosis not present

## 2021-05-29 DIAGNOSIS — Z515 Encounter for palliative care: Secondary | ICD-10-CM

## 2021-05-29 LAB — IRON,TIBC AND FERRITIN PANEL
%SAT: 22
Ferritin: 174
Iron: 52
TIBC: 239

## 2021-05-29 LAB — CBC AND DIFFERENTIAL
Hemoglobin: 11.9 — AB (ref 12.0–16.0)
Platelets: 279 10*3/uL (ref 150–400)
WBC: 9.3

## 2021-05-29 LAB — VITAMIN B12: Vitamin B-12: >2000

## 2021-05-29 LAB — COMPREHENSIVE METABOLIC PANEL: Calcium: 8.7 (ref 8.7–10.7)

## 2021-05-29 NOTE — Progress Notes (Signed)
PATIENT NAME: [name] ?DOB: 10-Nov-1934 ?MRN: [id number] ? ?PRIMARY CARE PROVIDER: Eustaquio Boyden, MD ? ?RESPONSIBLE PARTY:  ?Acct ID - Guarantor Home Phone Work Phone Relationship Acct Type  ?[id number] LISETT, DIRUSSO* [phone]  Self P/F  ?   [address], [place], [place] [address]  ? ? ?Home visit completed to obtain blood work as ordered by PCP.  Meet daughter [name] who translates for patient.  Instructions given on blood work ordered and procedure explained.  Able to access blood after 3 attempts to the left arm.  Patient tolerated procedure well.  Blood taken to [facility] on [address] for processing.  Results to be faxed to PCP and [facility], Palliative Care NP for patient.  ? ?Truitt Merle, RN ? ?

## 2021-05-29 NOTE — Telephone Encounter (Signed)
Home Health verbal orders ?Caller Name:Stephanie ?Agency Name: [facility] ? ?Callback number: [phone] ? ?Requesting OT/PT/Skilled nursing/Social Work/Speech: ? ?Reason:OT ? ?Frequency:1 wk for 4 wks ? ?Please forward to [facility] pool or providers CMA  ?

## 2021-05-29 NOTE — Telephone Encounter (Signed)
Agree with OT verbal order as per below ?

## 2021-05-30 DIAGNOSIS — D649 Anemia, unspecified: Secondary | ICD-10-CM | POA: Diagnosis not present

## 2021-05-30 DIAGNOSIS — M170 Bilateral primary osteoarthritis of knee: Secondary | ICD-10-CM | POA: Diagnosis not present

## 2021-05-30 DIAGNOSIS — S72141D Displaced intertrochanteric fracture of right femur, subsequent encounter for closed fracture with routine healing: Secondary | ICD-10-CM | POA: Diagnosis not present

## 2021-05-30 DIAGNOSIS — I2510 Atherosclerotic heart disease of native coronary artery without angina pectoris: Secondary | ICD-10-CM | POA: Diagnosis not present

## 2021-05-30 DIAGNOSIS — I6529 Occlusion and stenosis of unspecified carotid artery: Secondary | ICD-10-CM | POA: Diagnosis not present

## 2021-05-30 DIAGNOSIS — I480 Paroxysmal atrial fibrillation: Secondary | ICD-10-CM | POA: Diagnosis not present

## 2021-05-30 DIAGNOSIS — I119 Hypertensive heart disease without heart failure: Secondary | ICD-10-CM | POA: Diagnosis not present

## 2021-05-30 DIAGNOSIS — E039 Hypothyroidism, unspecified: Secondary | ICD-10-CM | POA: Diagnosis not present

## 2021-05-30 DIAGNOSIS — E119 Type 2 diabetes mellitus without complications: Secondary | ICD-10-CM | POA: Diagnosis not present

## 2021-05-30 DIAGNOSIS — G3184 Mild cognitive impairment, so stated: Secondary | ICD-10-CM | POA: Diagnosis not present

## 2021-05-30 LAB — BASIC METABOLIC PANEL
Creatinine: 0.6 (ref 0.5–1.1)
Glucose: 86
Potassium: 4.6 mEq/L (ref 3.5–5.1)
Sodium: 135 — AB (ref 137–147)

## 2021-05-30 NOTE — Telephone Encounter (Signed)
Lvm asking [name] to call back.  Need to inform her Dr. G is giving verbal orders for services requested.  ?

## 2021-05-31 ENCOUNTER — Encounter: Payer: Self-pay | Admitting: Family Medicine

## 2021-05-31 ENCOUNTER — Telehealth: Payer: Self-pay | Admitting: Family Medicine

## 2021-05-31 ENCOUNTER — Other Ambulatory Visit: Payer: Self-pay | Admitting: Family Medicine

## 2021-05-31 DIAGNOSIS — Z794 Long term (current) use of insulin: Secondary | ICD-10-CM

## 2021-05-31 MED ORDER — GLUCOSE BLOOD VI STRP: ORAL_STRIP | 3 refills | Status: DC

## 2021-05-31 NOTE — Telephone Encounter (Signed)
Noted  

## 2021-05-31 NOTE — Telephone Encounter (Signed)
Spoke with pt's daughter, [name] (on dpr), getting clarification of request.  States pt test blood sugar TID, so she needs enough test strips.  Told her I am sending new rx now [facility] [address]  She expresses her thanks. ? ?E-scribed new rx for test strips, #300/3.  ?

## 2021-05-31 NOTE — Telephone Encounter (Signed)
Pt daughter [name] called in requesting a call back regarding pt RX stated is was sent in incorrectly . # [phone] ?

## 2021-05-31 NOTE — Telephone Encounter (Signed)
Pt daughter came in office stating her mom need a refill on glucose blood test strips . Pt need more also pt daughter said pt test herself 3 times a day and dr g. Only supplied for her to test once a day the patient stating her mom been paying out of pocket for it also and it's expensive for her ?

## 2021-05-31 NOTE — Telephone Encounter (Signed)
Spoke with Stephanie informing her Dr. G is giving verbal orders for services requested.  

## 2021-05-31 NOTE — Telephone Encounter (Signed)
ERx 

## 2021-05-31 NOTE — Telephone Encounter (Signed)
Thomasenia Sales nurse from [facility] called in to make PCP aware that pt missed a visit and it was reschedule for next week   # [phone] ?

## 2021-05-31 NOTE — Telephone Encounter (Signed)
Refill request Xarelto ?Last office visit 04/12/21  ?Last refill 04/20/20 #90/3 ?

## 2021-06-06 ENCOUNTER — Telehealth: Payer: Self-pay

## 2021-06-06 NOTE — Telephone Encounter (Signed)
Ok to do this if it's pt's desire. thanks.  ?

## 2021-06-06 NOTE — Telephone Encounter (Signed)
Lvm on secured line informing [name] Dr. Reece Agar is giving verbal orders to d/c from nursing [facility].  ?

## 2021-06-06 NOTE — Telephone Encounter (Signed)
[facility] [name] [phone] ?Ok to leave v/m  ? ?Patient is requesting discharge from nursing home health. She would like to continue the PT. She only needs d/c verbal orders from Nursing  ?

## 2021-06-10 DIAGNOSIS — S72141D Displaced intertrochanteric fracture of right femur, subsequent encounter for closed fracture with routine healing: Secondary | ICD-10-CM | POA: Diagnosis not present

## 2021-06-10 NOTE — Telephone Encounter (Signed)
Spoke with [name] asking if she got my vm about Dr. Reece Agar giving verbal orders to d/c pt from nursing.  Confirms she did and expresses her thanks.  ?

## 2021-06-11 ENCOUNTER — Encounter: Payer: Self-pay | Admitting: Family Medicine

## 2021-06-11 NOTE — Addendum Note (Signed)
Addended by: Eustaquio Boyden on: 06/11/2021 08:07 AM ? ? Modules accepted: Orders ? ?

## 2021-06-13 ENCOUNTER — Other Ambulatory Visit: Payer: Self-pay | Admitting: Family Medicine

## 2021-06-15 ENCOUNTER — Other Ambulatory Visit: Payer: Self-pay | Admitting: Family Medicine

## 2021-06-18 ENCOUNTER — Telehealth: Payer: Self-pay

## 2021-06-18 NOTE — Telephone Encounter (Signed)
Left a message on voicemail to call the office back. ?

## 2021-06-18 NOTE — Telephone Encounter (Signed)
Plz call daughter for update after fall at home over the weekend.  ?

## 2021-06-18 NOTE — Telephone Encounter (Signed)
[facility] Night - Client ?Nonclinical Telephone Record  ?AccessNurse? ?Client [facility] Night - Client ?Client Site [facility] - Night ?Provider Eustaquio Boyden - MD ?Contact Type Call ?Call Type Home Care Hospice Page Now ?Who Is Calling Home Health / Hospice Agency ?Caller Name Jeralyn Bennett ?Facility Name care home health ?Facility Number [phone] ?Patient Name [name] ?Patient DOB 1934-04-20 ?Reason for Call Report a patient fall ?Initial Comment [facility] is calling from [facility] to report a fall for a patient, occurred on the 15th, ?slipped and no symptoms. ?Disp. Time Disposition Final User ?06/17/2021 5:29:10 PM Send to [facility] Paging Queue Dia Sitter, Vici ?06/17/2021 5:31:05 PM Paged On Call back to Call Center - PC [place], [place] ?06/17/2021 5:52:23 PM Paged On Call back to Call Center - PC [place], [place] ?06/17/2021 6:10:24 PM Paged On Call back to Call Center - PC [place], [facility] ?06/17/2021 6:24:15 PM Page Completed Yes Wisdom, Melynda ?Paging ?DoctorName Phone DateTime Result/Outcome Message Type Notes ?Willow Ora - [place] [phone] 06/17/2021 ?5:31:05 PM ?Paged On Call ?Back to Call ?Center ?Doctor Paged ?Hello Dr. Drue Novel, this is your answering ?service contacting you with a ?page. Please give [place] a call back at ?[phone]. Thank you so much! ?Willow Ora - MD [phone] 06/17/2021 ?5:52:23 PM ?Paged On Call ?Back to Call ?Center ?Doctor Paged ?Hello Dr. Drue Novel, this is your answering ?service contacting you with a ?page. Please give [place] a call back at ?[phone]. Thank you so much! ?Willow Ora - MD [phone] 06/17/2021 ?6:10:24 PM ?Called On Call ?Provider - Left ?Message ?Doctor Paged ?Willow Ora - MD 06/17/2021 ?6:24:11 PM ?Spoke with On ?Call - General Message Result ?Unable to connect Dr Drue Novel with the ?caller due to no response from the ?number provided. ?Call Closed By: Boone Master Wisdom ?Transaction Date/Time: 06/17/2021 5:25:25 PM  (ET ?

## 2021-06-19 ENCOUNTER — Other Ambulatory Visit: Payer: 59 | Admitting: Student

## 2021-06-19 DIAGNOSIS — Z515 Encounter for palliative care: Secondary | ICD-10-CM

## 2021-06-19 DIAGNOSIS — R52 Pain, unspecified: Secondary | ICD-10-CM

## 2021-06-19 DIAGNOSIS — R531 Weakness: Secondary | ICD-10-CM

## 2021-06-19 DIAGNOSIS — G3184 Mild cognitive impairment, so stated: Secondary | ICD-10-CM

## 2021-06-19 NOTE — Progress Notes (Signed)
? ? ?Manufacturing engineer ?Community Palliative Care Consult Note ?Telephone: [phone]  ?Fax: [phone]  ? ? ?Date of encounter: 06/19/21 ?11:09 AM ?PATIENT NAME: [name] ?[name] ?[name] [address]   ?[phone] (home)  ?DOB: [birth year] ?MRN: [id number] ?PRIMARY CARE PROVIDER:    ?Ria Bush, MD,  ?[address] [place] [address] ?[phone] ? ?REFERRING PROVIDER:   ?Ria Bush, MD ?[place] ?[place],  [place] [address] ?[phone] ? ?RESPONSIBLE PARTY:    ?Contact Information   ? ? Name Relation Home Work Mobile  ? [name],Deanna Daughter [phone]    ? [name] [phone]    ? ?  ? ? ? ?I met face to face with patient and family in the home. Palliative Care was asked to follow this patient by consultation request of  Ria Bush, MD to address advance care planning and complex medical decision making. This is a follow up visit. ? ?                                 ASSESSMENT AND PLAN / RECOMMENDATIONS:  ? ?Advance Care Planning/Goals of Care: Goals include to maximize quality of life and symptom management. Patient/health care surrogate gave his/her permission to discuss. ?Our advance care planning conversation included a discussion about:    ?The value and importance of advance care planning  ?Experiences with loved ones who have been seriously ill or have died  ?Exploration of personal, cultural or spiritual beliefs that might influence medical decisions  ?Exploration of goals of care in the event of a sudden injury or illness  ?DNR form completed and left in home; uploaded to [facility] ?CODE STATUS: DNR ? ?Palliative Medicine will continue to provide ongoing support, symptom management.  ? ?Symptom Management/Plan: ? ?Cognitive impairment-patient with forgetfulness; requires cueing. Encourage reading, activities to stimulate patient. Follow up with neurologist as scheduled.  ? ?Generalized weakness-weakness improving;  patient completed HH PT yesterday. She is to start outpatient therapy next week. ? ?Pain-c/o bilateral knee pain, right hip pain. Patient received knee injections in  Continue acetaminophen BID, norco PRN for moderate/severe pain. Patient also applies [place] PRN. Patient reports having new onset of left sided headaches in past 2 weeks; they usually go away on their own. May try ice packs PRN. Monitor for worsening symptoms. Patient to see neurology upcoming.   ? ? ?Follow up Palliative Care Visit: Palliative care will continue to follow for complex medical decision making, advance care planning, and clarification of goals. Return in 8-12 weeks or prn. ? ? ?This visit was coded based on medical decision making (MDM). ? ?PPS: 40% ? ?HOSPICE ELIGIBILITY/DIAGNOSIS: TBD ? ?Chief Complaint: Palliative Medicine follow up visit.  ? ?HISTORY OF PRESENT ILLNESS:  [name] is a 87 y.o. year old female  with  atrial fibrillation, T2DM, hypertension, hypothyroidism, arthritis, hx of CVA. Patient recently hospitalized 2/23-05/2021 due to closed right hip fracture with repair, atrial fibrillation, physical deconditioning, iron deficiency anemia.  ? ?[place] therapy completed yesterday. Will start out patient therapy on the 24th. Had a fall over the weekend. No apparent injury. Reports having sharp headaches on left side started about 15 days ago. Denies dizziness, blurry or double vision, no light sensitivity, no jaw pain or difficulty chewing. Patient states pain "comes and goes fast." Taking Tylenol BID, norco PRN. Daughter reports increased confusion, she is supposed to follow up with neurologist. [name] good  appetite; daughter states her blood sugars have been better. No hypoglycemic episodes reported.Taking insulin 10 units; to reduce if AM blood sugars are running lower.  ? ?History obtained from review of EMR, discussion with primary team, and interview with family, facility staff/caregiver and/or [name].  ?I reviewed  available labs, medications, imaging, studies and related documents from the EMR.  Records reviewed and summarized above.  ? ? ?Physical Exam: ?Pulse 88, resp 18, b/p 114/62, 99% on room air ?Constitutional: NAD ?General: frail appearing, thin ?EYES: anicteric sclera, lids intact, no discharge  ?ENMT: intact hearing, oral mucous membranes moist, dentition intact ?CV: S1S2, RRR, no LE edema ?Pulmonary: LCTA, no increased work of breathing, no cough, room air ?Abdomen: normo-active BS + 4 quadrants, soft and non tender, no ascites ?GU: deferred ?MSK: no sarcopenia, moves all extremities, ambulatory ?Skin: warm and dry, no rashes or wounds on visible skin, no scalp tenderness upon palpation ?Neuro: + generalized weakness ?Psych: non-anxious affect, A and O x 3, pleasant ?Hem/lymph/immuno: no widespread bruising ? ? ?Thank you for the opportunity to participate in the care of [name].  The palliative care team will continue to follow. Please call our office at [phone] if we can be of additional assistance.  ? ?Ezekiel Slocumb, NP  ? ?COVID-19 PATIENT SCREENING TOOL ?Asked and negative response unless otherwise noted:  ? ?Have you had symptoms of covid, tested positive or been in contact with someone with symptoms/positive test in the past 5-10 days? No ? ?

## 2021-06-20 NOTE — Telephone Encounter (Signed)
Pt's daughter called back. She was up every hour until 5am. She must have been tired. She was sitting in her incline chair and slid out of the chair. Daughter does not think she hit her head. They are checking her BP and it has been up and down. She is having some head pain on 1 side. Daughter said she will elaborate in a MyChart message. ?

## 2021-06-20 NOTE — Telephone Encounter (Signed)
Lvm asking pt's daughter, [name] (on dpr), to call back.  Need an update on pt.  ?

## 2021-06-21 ENCOUNTER — Encounter: Payer: Self-pay | Admitting: Family Medicine

## 2021-06-24 DIAGNOSIS — M25551 Pain in right hip: Secondary | ICD-10-CM | POA: Diagnosis not present

## 2021-06-24 DIAGNOSIS — M6281 Muscle weakness (generalized): Secondary | ICD-10-CM | POA: Diagnosis not present

## 2021-06-24 DIAGNOSIS — R262 Difficulty in walking, not elsewhere classified: Secondary | ICD-10-CM | POA: Diagnosis not present

## 2021-06-27 DIAGNOSIS — E119 Type 2 diabetes mellitus without complications: Secondary | ICD-10-CM | POA: Diagnosis not present

## 2021-06-27 DIAGNOSIS — R0602 Shortness of breath: Secondary | ICD-10-CM | POA: Diagnosis not present

## 2021-06-27 DIAGNOSIS — H2512 Age-related nuclear cataract, left eye: Secondary | ICD-10-CM | POA: Diagnosis not present

## 2021-06-27 LAB — HM DIABETES EYE EXAM

## 2021-06-28 DIAGNOSIS — S40021D Contusion of right upper arm, subsequent encounter: Secondary | ICD-10-CM | POA: Diagnosis not present

## 2021-06-28 DIAGNOSIS — R2689 Other abnormalities of gait and mobility: Secondary | ICD-10-CM | POA: Diagnosis not present

## 2021-06-28 DIAGNOSIS — R296 Repeated falls: Secondary | ICD-10-CM | POA: Diagnosis not present

## 2021-06-28 DIAGNOSIS — I639 Cerebral infarction, unspecified: Secondary | ICD-10-CM | POA: Diagnosis not present

## 2021-06-28 DIAGNOSIS — G309 Alzheimer's disease, unspecified: Secondary | ICD-10-CM | POA: Diagnosis not present

## 2021-06-28 DIAGNOSIS — R69 Illness, unspecified: Secondary | ICD-10-CM | POA: Diagnosis not present

## 2021-06-28 DIAGNOSIS — D72829 Elevated white blood cell count, unspecified: Secondary | ICD-10-CM | POA: Diagnosis not present

## 2021-06-28 DIAGNOSIS — G301 Alzheimer's disease with late onset: Secondary | ICD-10-CM | POA: Diagnosis not present

## 2021-07-03 MED ORDER — LANTUS SOLOSTAR 100 UNIT/ML ~~LOC~~ SOPN
10.0000 [IU] | PEN_INJECTOR | Freq: Every morning | SUBCUTANEOUS | 1 refills | Status: DC
Start: 2021-07-03 — End: 2021-07-05

## 2021-07-03 NOTE — Progress Notes (Signed)
ERx Lantus ?

## 2021-07-03 NOTE — Addendum Note (Signed)
Addended by: Eustaquio Boyden on: 07/03/2021 05:20 PM ? ? Modules accepted: Orders ? ?

## 2021-07-04 ENCOUNTER — Other Ambulatory Visit: Payer: Self-pay | Admitting: Family Medicine

## 2021-07-05 NOTE — Telephone Encounter (Signed)
Message from pharmacy stating Lantus is not covered.  Requesting rx for alt Basaglar. ? ?Last OV:  04/12/21, CPE ?Next OV:  none ?

## 2021-07-05 NOTE — Telephone Encounter (Signed)
ERx 

## 2021-07-08 ENCOUNTER — Other Ambulatory Visit: Payer: Self-pay | Admitting: Family Medicine

## 2021-07-08 DIAGNOSIS — M25551 Pain in right hip: Secondary | ICD-10-CM | POA: Diagnosis not present

## 2021-07-08 DIAGNOSIS — R262 Difficulty in walking, not elsewhere classified: Secondary | ICD-10-CM | POA: Diagnosis not present

## 2021-07-08 DIAGNOSIS — M6281 Muscle weakness (generalized): Secondary | ICD-10-CM | POA: Diagnosis not present

## 2021-07-09 ENCOUNTER — Other Ambulatory Visit: Payer: Self-pay | Admitting: Family Medicine

## 2021-07-09 ENCOUNTER — Telehealth: Payer: Self-pay

## 2021-07-09 MED ORDER — LANTUS SOLOSTAR 100 UNIT/ML ~~LOC~~ SOPN
10.0000 [IU] | PEN_INJECTOR | Freq: Every morning | SUBCUTANEOUS | 1 refills | Status: DC
Start: 2021-07-09 — End: 2021-09-30

## 2021-07-09 NOTE — Telephone Encounter (Signed)
See pt message. Lantus resubmitted.  ?

## 2021-07-09 NOTE — Addendum Note (Signed)
Addended by: Ria Bush on: 07/09/2021 08:00 AM ? ? Modules accepted: Orders ? ?

## 2021-07-10 NOTE — Telephone Encounter (Signed)
See refill request 07/04/21. Spoke to pharmacist Dimas Aguas at [facility]. Dimas Aguas was advised that patient's daughter stated that Medicaid will cover her mom's insulin the way that it is written.  Dimas Aguas tried to put the script thru again and it would not process. Dimas Aguas stated that he thinks that there may be a problem with his computer system. Dimas Aguas stated that he will continue to try and process the refill with Medicaid. Dimas Aguas stated that he will call back once he is able to get the prescription processed. ?

## 2021-07-15 NOTE — Telephone Encounter (Signed)
Spoke to clerk at [facility] and was advised that insulin is ready there for patient to pick it up. ?

## 2021-07-17 DIAGNOSIS — R262 Difficulty in walking, not elsewhere classified: Secondary | ICD-10-CM | POA: Diagnosis not present

## 2021-07-17 DIAGNOSIS — M25551 Pain in right hip: Secondary | ICD-10-CM | POA: Diagnosis not present

## 2021-07-17 DIAGNOSIS — M6281 Muscle weakness (generalized): Secondary | ICD-10-CM | POA: Diagnosis not present

## 2021-07-24 ENCOUNTER — Encounter: Payer: Self-pay | Admitting: Family Medicine

## 2021-07-24 DIAGNOSIS — M25551 Pain in right hip: Secondary | ICD-10-CM | POA: Diagnosis not present

## 2021-07-24 DIAGNOSIS — M6281 Muscle weakness (generalized): Secondary | ICD-10-CM | POA: Diagnosis not present

## 2021-07-24 DIAGNOSIS — R262 Difficulty in walking, not elsewhere classified: Secondary | ICD-10-CM | POA: Diagnosis not present

## 2021-07-26 DIAGNOSIS — S72141D Displaced intertrochanteric fracture of right femur, subsequent encounter for closed fracture with routine healing: Secondary | ICD-10-CM | POA: Diagnosis not present

## 2021-07-28 DIAGNOSIS — R2689 Other abnormalities of gait and mobility: Secondary | ICD-10-CM | POA: Diagnosis not present

## 2021-07-28 DIAGNOSIS — S40021D Contusion of right upper arm, subsequent encounter: Secondary | ICD-10-CM | POA: Diagnosis not present

## 2021-07-28 DIAGNOSIS — R69 Illness, unspecified: Secondary | ICD-10-CM | POA: Diagnosis not present

## 2021-07-28 DIAGNOSIS — I639 Cerebral infarction, unspecified: Secondary | ICD-10-CM | POA: Diagnosis not present

## 2021-07-28 DIAGNOSIS — D72829 Elevated white blood cell count, unspecified: Secondary | ICD-10-CM | POA: Diagnosis not present

## 2021-07-28 DIAGNOSIS — R296 Repeated falls: Secondary | ICD-10-CM | POA: Diagnosis not present

## 2021-07-28 DIAGNOSIS — G301 Alzheimer's disease with late onset: Secondary | ICD-10-CM | POA: Diagnosis not present

## 2021-08-16 ENCOUNTER — Other Ambulatory Visit: Payer: Self-pay | Admitting: Family Medicine

## 2021-08-20 ENCOUNTER — Other Ambulatory Visit: Payer: 59 | Admitting: Student

## 2021-08-20 DIAGNOSIS — Z515 Encounter for palliative care: Secondary | ICD-10-CM

## 2021-08-20 DIAGNOSIS — G8929 Other chronic pain: Secondary | ICD-10-CM

## 2021-08-20 DIAGNOSIS — R600 Localized edema: Secondary | ICD-10-CM

## 2021-08-20 DIAGNOSIS — R531 Weakness: Secondary | ICD-10-CM

## 2021-08-20 DIAGNOSIS — Z794 Long term (current) use of insulin: Secondary | ICD-10-CM

## 2021-08-20 NOTE — Progress Notes (Signed)
[facility] Pall[facility] Care Consult Note Telephone: [phone]  Fax: [phone]    Date of encounter: 08/20/21 11:39 AM PATIENT NAME: [name] [place] [address]   [phone] (home)  DOB: [birth year] MRN: [id number] PRIMARY CARE PROVIDER:    Ria Bush, MD,  [place] [address] [phone]  REFERRING PROVIDER:   Ria Bush, MD [address] [place],  [place] [address] [phone]  RESPONSIBLE PARTY:    Contact Information     Name Relation Home Work Mobile   [place] Daughter [phone]     Tommy Rainwater [phone]          I met face to face with patient and family in the home. Palliative Care was asked to follow this patient by consultation request of  Ria Bush, MD to address advance care planning and complex medical decision making. This is a follow up visit.                                   ASSESSMENT AND PLAN / RECOMMENDATIONS:   Advance Care Planning/Goals of Care: Goals include to maximize quality of life and symptom management. Patient/health care surrogate gave his/her permission to discuss. Our advance care planning conversation included a discussion about:    The value and importance of advance care planning  Experiences with loved ones who have been seriously ill or have died  Exploration of personal, cultural or spiritual beliefs that might influence medical decisions  Exploration of goals of care in the event of a sudden injury or illness  CODE STATUS: DNR   Goal is for patient to remain in the home, although daughter does express caregiver fatigue. Palliative Medicine will continue to provide ongoing support and symptom management.  Symptom Management/Plan:  Bilateral knee pain- patient with continued bilateral knee pain. Recommend acetaminophen routinely; use norco PRN for severe pain. Education on 3000 mg/daily max of  acetaminophen. Patient is to receive Monovisc injections tomorrow. Follow up with orthopedics as scheduled.  Generalized weakness-improved. She has completed out patient therapy. Recommend continuing home exercises, use walker for ambulation. Monitor for falls/safety.   Edema- daughter reports patient having edema to bilateral feet/ankles. No edema present today. Education provided on possible causes. She is encouraged to elevate her feet/legs as she is up to chair during the day. Adequate fluids are also encouraged as she does not always drink well.   T2DM-blood sugars usually between 70-80's each AM. Continue checking blood sugar routinely. Encourage HS snack to help prevent hypoglycemia in the AM. Continue insulin and metformin as directed. Appetite- has improved. She is eating well balanced diet. Her appetite is usually good. She is drinking sugar free protein clear supplement occasionally. Encourage adequate fluid intake.   Follow up Palliative Care Visit: Palliative care will continue to follow for complex medical decision making, advance care planning, and clarification of goals. Return in 8-12 weeks or prn.  This visit was coded based on medical decision making (MDM).  PPS: 40%  HOSPICE ELIGIBILITY/DIAGNOSIS: TBD  Chief Complaint: Palliative Medicine follow up visit.   HISTORY OF PRESENT ILLNESS:  [name] is a 87 y.o. year old female  with atrial fibrillation, T2DM, hypertension, hypothyroidism, arthritis, hx of CVA. Hospitalized 2/23-05/2021 due to closed right hip fracture with repair, atrial fibrillation, physical deconditioning, iron deficiency anemia.    Patient and daughter report patient doing well. She  has had increased pedal edema recently; none today. Her blood sugars have been 70-80's mg/dL. Family is checking blood sugars twice a day. Endorses a good appetite. Patient is to have bilateral knee injections tomorrow. Occasionally taking norco for pain when pain is worse. She  has completed out patient therapy. Daughter expresses some caregiver fatigue; patient can be resistant towards her at times. HPI and ROS obtained from patient, daughter and caregiver.   History obtained from review of EMR, discussion with primary team, and interview with family, facility staff/caregiver and/or [name].  I reviewed available labs, medications, imaging, studies and related documents from the EMR.  Records reviewed and summarized above.    Physical Exam: Weight: 125 pounds  Pulse 76, resp 16, b/p 110/62 Constitutional: NAD General: frail appearing, thin EYES: anicteric sclera, lids intact, no discharge  ENMT: intact hearing, oral mucous membranes moist, dentition intact CV: S1S2, RRR, no LE edema Pulmonary: LCTA, no increased work of breathing, no cough, room air Abdomen: normo-active BS + 4 quadrants, soft and non tender, no ascites GU: deferred MSK:  moves all extremities, ambulatory Skin: warm and dry, no rashes or wounds on visible skin Neuro:  no generalized weakness, A & O x 3 Psych: non-anxious affect, pleasant Hem/lymph/immuno: no widespread bruising   Thank you for the opportunity to participate in the care of [name].  The palliative care team will continue to follow. Please call our office at [phone] if we can be of additional assistance.   Ezekiel Slocumb, NP   COVID-19 PATIENT SCREENING TOOL Asked and negative response unless otherwise noted:   Have you had symptoms of covid, tested positive or been in contact with someone with symptoms/positive test in the past 5-10 days? No

## 2021-08-28 DIAGNOSIS — I639 Cerebral infarction, unspecified: Secondary | ICD-10-CM | POA: Diagnosis not present

## 2021-08-28 DIAGNOSIS — R2689 Other abnormalities of gait and mobility: Secondary | ICD-10-CM | POA: Diagnosis not present

## 2021-08-28 DIAGNOSIS — G301 Alzheimer's disease with late onset: Secondary | ICD-10-CM | POA: Diagnosis not present

## 2021-08-28 DIAGNOSIS — D72829 Elevated white blood cell count, unspecified: Secondary | ICD-10-CM | POA: Diagnosis not present

## 2021-08-28 DIAGNOSIS — R296 Repeated falls: Secondary | ICD-10-CM | POA: Diagnosis not present

## 2021-08-28 DIAGNOSIS — R69 Illness, unspecified: Secondary | ICD-10-CM | POA: Diagnosis not present

## 2021-08-28 DIAGNOSIS — S40021D Contusion of right upper arm, subsequent encounter: Secondary | ICD-10-CM | POA: Diagnosis not present

## 2021-09-17 DIAGNOSIS — I4819 Other persistent atrial fibrillation: Secondary | ICD-10-CM | POA: Diagnosis not present

## 2021-09-17 DIAGNOSIS — E785 Hyperlipidemia, unspecified: Secondary | ICD-10-CM | POA: Diagnosis not present

## 2021-09-20 ENCOUNTER — Other Ambulatory Visit: Payer: Self-pay | Admitting: Family Medicine

## 2021-09-26 ENCOUNTER — Other Ambulatory Visit: Payer: Self-pay | Admitting: Family Medicine

## 2021-09-26 NOTE — Telephone Encounter (Signed)
Lantus Last filled:  08/12/21, #15 mL Last OV:  04/12/21, CPE Next OV:  none

## 2021-09-27 DIAGNOSIS — R296 Repeated falls: Secondary | ICD-10-CM | POA: Diagnosis not present

## 2021-09-27 DIAGNOSIS — S40021D Contusion of right upper arm, subsequent encounter: Secondary | ICD-10-CM | POA: Diagnosis not present

## 2021-09-27 DIAGNOSIS — R2689 Other abnormalities of gait and mobility: Secondary | ICD-10-CM | POA: Diagnosis not present

## 2021-09-27 DIAGNOSIS — R69 Illness, unspecified: Secondary | ICD-10-CM | POA: Diagnosis not present

## 2021-09-27 DIAGNOSIS — G301 Alzheimer's disease with late onset: Secondary | ICD-10-CM | POA: Diagnosis not present

## 2021-09-27 DIAGNOSIS — D72829 Elevated white blood cell count, unspecified: Secondary | ICD-10-CM | POA: Diagnosis not present

## 2021-09-27 DIAGNOSIS — I639 Cerebral infarction, unspecified: Secondary | ICD-10-CM | POA: Diagnosis not present

## 2021-09-30 ENCOUNTER — Other Ambulatory Visit: Payer: Self-pay | Admitting: Family Medicine

## 2021-09-30 NOTE — Telephone Encounter (Signed)
ERx 

## 2021-10-01 NOTE — Telephone Encounter (Signed)
[name], will you plz do a PA for pt's Lantus?

## 2021-10-02 ENCOUNTER — Telehealth: Payer: Self-pay

## 2021-10-02 NOTE — Telephone Encounter (Signed)
I tried starting a prior auth on Cover My Meds for Lantus Solostar Pen 100 unit/ML.  I got the response that this plan is not enabled for ePAs.  I have faxed a Standard Drug Request Form to [facility] Medicaid for Pharmacy Prior Approval Request at F# [phone].  Received fax confirmation.  Waiting for determination.

## 2021-10-03 NOTE — Telephone Encounter (Signed)
PA submitted (see 10/02/21 phn note).  Decision pending.

## 2021-10-14 ENCOUNTER — Other Ambulatory Visit: Payer: Self-pay | Admitting: Family Medicine

## 2021-10-15 NOTE — Telephone Encounter (Signed)
Have you heard a decision about this PA yet?

## 2021-10-15 NOTE — Telephone Encounter (Signed)
I called and spoke to [facility] at [facility]  Pharmacy PA Call Center at [phone].  Per [place], a PA is not required for Lantus Solostar.

## 2021-10-16 NOTE — Telephone Encounter (Signed)
Noted.  Made pharmacy aware and will fill for pt.

## 2021-10-18 DIAGNOSIS — I639 Cerebral infarction, unspecified: Secondary | ICD-10-CM | POA: Diagnosis not present

## 2021-10-18 DIAGNOSIS — G301 Alzheimer's disease with late onset: Secondary | ICD-10-CM | POA: Diagnosis not present

## 2021-10-18 DIAGNOSIS — D72829 Elevated white blood cell count, unspecified: Secondary | ICD-10-CM | POA: Diagnosis not present

## 2021-10-18 DIAGNOSIS — R2689 Other abnormalities of gait and mobility: Secondary | ICD-10-CM | POA: Diagnosis not present

## 2021-10-18 DIAGNOSIS — F0150 Vascular dementia without behavioral disturbance: Secondary | ICD-10-CM | POA: Diagnosis not present

## 2021-10-18 DIAGNOSIS — S40021D Contusion of right upper arm, subsequent encounter: Secondary | ICD-10-CM | POA: Diagnosis not present

## 2021-10-18 DIAGNOSIS — R296 Repeated falls: Secondary | ICD-10-CM | POA: Diagnosis not present

## 2021-10-22 ENCOUNTER — Other Ambulatory Visit: Payer: Self-pay | Admitting: Family Medicine

## 2021-10-22 NOTE — Telephone Encounter (Signed)
LVM for patient to call and schedule

## 2021-10-22 NOTE — Telephone Encounter (Signed)
Noted  

## 2021-10-22 NOTE — Telephone Encounter (Signed)
E-scribed refill.  Plz schedule 6 mo DM f/u-- was due as of 10/10/21.

## 2021-10-24 DIAGNOSIS — I639 Cerebral infarction, unspecified: Secondary | ICD-10-CM | POA: Diagnosis not present

## 2021-10-24 DIAGNOSIS — R2689 Other abnormalities of gait and mobility: Secondary | ICD-10-CM | POA: Diagnosis not present

## 2021-10-24 DIAGNOSIS — D72829 Elevated white blood cell count, unspecified: Secondary | ICD-10-CM | POA: Diagnosis not present

## 2021-10-24 DIAGNOSIS — G301 Alzheimer's disease with late onset: Secondary | ICD-10-CM | POA: Diagnosis not present

## 2021-10-24 DIAGNOSIS — F0150 Vascular dementia without behavioral disturbance: Secondary | ICD-10-CM | POA: Diagnosis not present

## 2021-10-24 DIAGNOSIS — R296 Repeated falls: Secondary | ICD-10-CM | POA: Diagnosis not present

## 2021-10-24 DIAGNOSIS — S40021D Contusion of right upper arm, subsequent encounter: Secondary | ICD-10-CM | POA: Diagnosis not present

## 2021-11-12 ENCOUNTER — Encounter: Payer: Self-pay | Admitting: Family Medicine

## 2021-11-12 ENCOUNTER — Ambulatory Visit: Payer: Medicaid Other | Admitting: Family Medicine

## 2021-11-12 VITALS — BP 124/80 | HR 75 | Temp 97.5°F | Ht 61.0 in | Wt 128.4 lb

## 2021-11-12 DIAGNOSIS — Z794 Long term (current) use of insulin: Secondary | ICD-10-CM

## 2021-11-12 DIAGNOSIS — R531 Weakness: Secondary | ICD-10-CM

## 2021-11-12 DIAGNOSIS — Z8673 Personal history of transient ischemic attack (TIA), and cerebral infarction without residual deficits: Secondary | ICD-10-CM | POA: Diagnosis not present

## 2021-11-12 DIAGNOSIS — E118 Type 2 diabetes mellitus with unspecified complications: Secondary | ICD-10-CM

## 2021-11-12 DIAGNOSIS — D649 Anemia, unspecified: Secondary | ICD-10-CM | POA: Diagnosis not present

## 2021-11-12 DIAGNOSIS — G8929 Other chronic pain: Secondary | ICD-10-CM

## 2021-11-12 DIAGNOSIS — R296 Repeated falls: Secondary | ICD-10-CM | POA: Diagnosis not present

## 2021-11-12 DIAGNOSIS — F0150 Vascular dementia without behavioral disturbance: Secondary | ICD-10-CM

## 2021-11-12 DIAGNOSIS — E039 Hypothyroidism, unspecified: Secondary | ICD-10-CM

## 2021-11-12 DIAGNOSIS — I480 Paroxysmal atrial fibrillation: Secondary | ICD-10-CM

## 2021-11-12 DIAGNOSIS — E611 Iron deficiency: Secondary | ICD-10-CM

## 2021-11-12 DIAGNOSIS — M170 Bilateral primary osteoarthritis of knee: Secondary | ICD-10-CM

## 2021-11-12 DIAGNOSIS — R5381 Other malaise: Secondary | ICD-10-CM | POA: Diagnosis not present

## 2021-11-12 DIAGNOSIS — I10 Essential (primary) hypertension: Secondary | ICD-10-CM

## 2021-11-12 LAB — COMPREHENSIVE METABOLIC PANEL
ALT: 10 U/L (ref 0–35)
AST: 15 U/L (ref 0–37)
Albumin: 3.9 g/dL (ref 3.5–5.2)
Alkaline Phosphatase: 60 U/L (ref 39–117)
BUN: 19 mg/dL (ref 6–23)
CO2: 27 mEq/L (ref 19–32)
Calcium: 9 mg/dL (ref 8.4–10.5)
Chloride: 98 mEq/L (ref 96–112)
Creatinine, Ser: 0.88 mg/dL (ref 0.40–1.20)
GFR: 60.09 mL/min (ref >60.00)
Glucose, Bld: 161 mg/dL — ABNORMAL HIGH (ref 70–99)
Potassium: 4.8 mEq/L (ref 3.5–5.1)
Sodium: 135 mEq/L (ref 135–145)
Total Bilirubin: 0.4 mg/dL (ref 0.2–1.2)
Total Protein: 6.8 g/dL (ref 6.0–8.3)

## 2021-11-12 LAB — TSH: TSH: 1.79 u[IU]/mL (ref 0.35–5.50)

## 2021-11-12 LAB — POCT GLYCOSYLATED HEMOGLOBIN (HGB A1C): Hemoglobin A1C: 7 % — AB (ref 4.0–5.6)

## 2021-11-12 LAB — CBC WITH DIFFERENTIAL/PLATELET
Basophils Absolute: 0 10*3/uL (ref 0.0–0.1)
Basophils Relative: 0.5 % (ref 0.0–3.0)
Eosinophils Absolute: 0.1 10*3/uL (ref 0.0–0.7)
Eosinophils Relative: 0.7 % (ref 0.0–5.0)
HCT: 35.7 % — ABNORMAL LOW (ref 36.0–46.0)
Hemoglobin: 11.8 g/dL — ABNORMAL LOW (ref 12.0–15.0)
Lymphocytes Relative: 18.4 % (ref 12.0–46.0)
Lymphs Abs: 1.5 10*3/uL (ref 0.7–4.0)
MCHC: 33.1 g/dL (ref 30.0–36.0)
MCV: 89.7 fl (ref 78.0–100.0)
Monocytes Absolute: 0.6 10*3/uL (ref 0.1–1.0)
Monocytes Relative: 7.4 % (ref 3.0–12.0)
Neutro Abs: 5.8 10*3/uL (ref 1.4–7.7)
Neutrophils Relative %: 73 % (ref 43.0–77.0)
Platelets: 202 10*3/uL (ref 150.0–400.0)
RBC: 3.98 Mil/uL (ref 3.87–5.11)
RDW: 14.5 % (ref 11.5–15.5)
WBC: 7.9 10*3/uL (ref 4.0–10.5)

## 2021-11-12 LAB — IBC PANEL
Iron: 84 ug/dL (ref 42–145)
Saturation Ratios: 28.2 % (ref 20.0–50.0)
TIBC: 298.2 ug/dL (ref 250.0–450.0)
Transferrin: 213 mg/dL (ref 212.0–360.0)

## 2021-11-12 LAB — FERRITIN: Ferritin: 31.1 ng/mL (ref 10.0–291.0)

## 2021-11-12 MED ORDER — RIVAROXABAN 20 MG PO TABS: 20.0000 mg | ORAL_TABLET | Freq: Every day | ORAL | 2 refills | Status: DC

## 2021-11-12 MED ORDER — METFORMIN HCL 500 MG PO TABS: 500.0000 mg | ORAL_TABLET | Freq: Every day | ORAL | 2 refills | Status: DC

## 2021-11-12 MED ORDER — HYDROCODONE-ACETAMINOPHEN 5-325 MG PO TABS: 1.0000 | ORAL_TABLET | Freq: Two times a day (BID) | ORAL | 0 refills | Status: DC | PRN

## 2021-11-12 NOTE — Progress Notes (Addendum)
Patient ID: [name], female    DOB: [birth year], 87 y.o.   MRN: [id number]  This visit was conducted in person.  BP 124/80   Pulse 75   Temp (!) 97.5 F (36.4 C) (Temporal)   Ht _0  (1.549 m)   Wt 128 lb 6 oz (58.2 kg)   SpO2 97%   BMI 24.26 kg/m    CC: 6 mo f/u visit  Subjective:   HPI: [name] is a 87 y.o. female presenting on 11/12/2021 for Follow-up (Here for 6 mo f/u.  Pt accompanied by daughter, [name] and assistant, Janett Billow.)   She stays with daughter Dalphine Handing, she now has assistant Janett Billow who also stays with her throughout the day.   R hip fracture after fall 04/2021 s/p surgery. Recovered overall very well however over the last few months noticing again deterioration at home, manifesting as intermittent generalized weakness.   Afib sees Dr Hermelinda Medicus cardiology on atenolol 70m, xarelto 2107mdaily. Low threshold to stop blood thinner if recurrent falls.   Late onset mixed Vascular/Alzheimer dementia - sees neurology Dr ShManuella Ghazilast seen 06/2021. Established with [facility] outpatient palliative care services. H/o chronic R MCA territory infarcts involving R frontal and temporal lobes as well as R basal ganglia.   Notes BP elevation last few days (139/100, 144/69)   DM - does regularly check sugars, they keep good log - data reviewed 90s. Compliant with antihyperglycemic regimen which includes: lantus 10u daily, metformin 50043mid - she's actually dropped dose to one tablet daily due to low sugars <70. Denies paresthesias, blurry vision. Last diabetic eye exam 06/2021. Glucometer brand: accucheck. Last foot exam: DUE. DSME: ?. Lab Results  Component Value Date   HGBA1C 7.0 (A) 11/12/2021   Diabetic Foot Exam - Simple   Simple Foot Form Diabetic Foot exam was performed with the following findings: Yes 11/14/2021 11:04 AM  Visual Inspection No deformities, no ulcerations, no other skin breakdown bilaterally: Yes Sensation Testing See comments: Yes Pulse  Check Posterior Tibialis and Dorsalis pulse intact bilaterally: Yes Comments Intact to light touch bilaterally    Lab Results  Component Value Date   MICROALBUR 2.0 (H) 04/12/2021     Seeing ortho for bilateral knee osteoarthritis - latest bilateral Monovisc injections 08/2021, insurance doesn't cover but [id number] also received L genicular nerve block by Dr ChasSharlet [id number]) with limited relief.  Taking tylenol 1000mg32m scheduled. Occasional hydrocodone for breakthrough pain.      Relevant past medical, surgical, family and social history reviewed and updated as indicated. Interim medical history since our last visit reviewed. Allergies and medications reviewed and updated. Outpatient Medications Prior to Visit  Medication Sig Dispense Refill   atenolol (TENORMIN) 25 MG tablet TAKE 1 TABLET (25 MG TOTAL) BY MOUTH DAILY. 30 tablet 6   Blood Glucose Monitoring Suppl (ACCU-CHEK GUIDE ME) w/Device KIT Use as instructed to check blood sugar 2 times a day 1 kit 0   glucose blood (ACCU-CHEK GUIDE) test strip Use as instructed to check blood sugar 2 times a day 200 each 0   Insulin Pen Needle (PEN NEEDLES) 32G X 4 MM MISC Use daily to inject diabetes medication 100 each 3   Lancets 30G MISC Check blood sugar once a day 100 each 3   LANTUS SOLOSTAR 100 UNIT/ML Solostar Pen INJECT 10 UNITS INTO THE SKIN IN THE MORNING. 15 mL 1   levothyroxine (SYNTHROID) 75 MCG tablet TAKE 1 TABLET BY MOUTH EVERY DAY BEFORE  BREAKFAST 30 tablet 0   polyethylene glycol powder (GLYCOLAX/MIRALAX) 17 GM/SCOOP powder Take 17 g by mouth daily as needed for moderate constipation. 1700 g 1   traZODone (DESYREL) 50 MG tablet Take 0.5-1 tablets (25-50 mg total) by mouth at bedtime. (Patient taking differently: Take 25-50 mg by mouth at bedtime as needed for sleep.) 90 tablet 3   glucose blood test strip Check blood sugar once a day (Patient taking differently: Use as instructed to check blood sugar 2 times a day) 300 each 3    HYDROcodone-acetaminophen (NORCO/VICODIN) 5-325 MG tablet Take 1-2 tablets by mouth every 6 (six) hours as needed for moderate pain. 30 tablet 0   metFORMIN (GLUCOPHAGE) 500 MG tablet Take 1 tablet (500 mg total) by mouth 2 (two) times daily with a meal. (Patient taking differently: Take 500 mg by mouth daily with breakfast.) 180 tablet 3   XARELTO 20 MG TABS tablet TAKE 1 TABLET BY MOUTH DAILY WITH SUPPER. 90 tablet 1   Bioflavonoid Products (BIOFLEX) TABS Take 1 tablet by mouth daily.     Iron, Ferrous Sulfate, 325 (65 Fe) MG TABS Take 325 mg by mouth every other day. (Patient not taking: Reported on 04/25/2021) 30 tablet    No facility-administered medications prior to visit.     Per HPI unless specifically indicated in ROS section below Review of Systems  Objective:  BP 124/80   Pulse 75   Temp (!) 97.5 F (36.4 C) (Temporal)   Ht _0  (1.549 m)   Wt 128 lb 6 oz (58.2 kg)   SpO2 97%   BMI 24.26 kg/m   Wt Readings from Last 3 Encounters:  11/12/21 128 lb 6 oz (58.2 kg)  04/25/21 131 lb (59.4 kg)  04/12/21 131 lb 2 oz (59.5 kg)      Physical Exam Vitals and nursing note reviewed.  Constitutional:      Appearance: Normal appearance. She is not ill-appearing.     Comments: Sitting in wheelchair   HENT:     Mouth/Throat:     Mouth: Mucous membranes are moist.     Pharynx: Oropharynx is clear. No oropharyngeal exudate or posterior oropharyngeal erythema.  Eyes:     Extraocular Movements: Extraocular movements intact.     Pupils: Pupils are equal, round, and reactive to light.  Cardiovascular:     Rate and Rhythm: Normal rate and regular rhythm.     Pulses: Normal pulses.     Heart sounds: Normal heart sounds. No murmur heard. Pulmonary:     Effort: Pulmonary effort is normal. No respiratory distress.     Breath sounds: Normal breath sounds. No wheezing, rhonchi or rales.  Musculoskeletal:        General: Tenderness present.     Right lower leg: No edema.     Left  lower leg: No edema.  Skin:    General: Skin is warm and dry.     Findings: No rash.  Neurological:     Mental Status: She is alert.     Comments:  5/5 strength BLE Sensation intact  Psychiatric:        Mood and Affect: Mood normal.        Behavior: Behavior normal.       Results for orders placed or performed in visit on 11/12/21  Ferritin  Result Value Ref Range   Ferritin 31.1 10.0 - 291.0 ng/mL  IBC panel  Result Value Ref Range   Iron 84 42 - 145 ug/dL  Transferrin 213.0 212.0 - 360.0 mg/dL   Saturation Ratios 28.2 20.0 - 50.0 %   TIBC 298.2 250.0 - 450.0 mcg/dL  Comprehensive metabolic panel  Result Value Ref Range   Sodium 135 135 - 145 mEq/L   Potassium 4.8 3.5 - 5.1 mEq/L   Chloride 98 96 - 112 mEq/L   CO2 27 19 - 32 mEq/L   Glucose, Bld 161 (H) 70 - 99 mg/dL   BUN 19 6 - 23 mg/dL   Creatinine, Ser 0.88 0.40 - 1.20 mg/dL   Total Bilirubin 0.4 0.2 - 1.2 mg/dL   Alkaline Phosphatase 60 39 - 117 U/L   AST 15 0 - 37 U/L   ALT 10 0 - 35 U/L   Total Protein 6.8 6.0 - 8.3 g/dL   Albumin 3.9 3.5 - 5.2 g/dL   GFR 60.09 >60.00 mL/min   Calcium 9.0 8.4 - 10.5 mg/dL  CBC with Differential/Platelet  Result Value Ref Range   WBC 7.9 4.0 - 10.5 K/uL   RBC 3.98 3.87 - 5.11 Mil/uL   Hemoglobin 11.8 (L) 12.0 - 15.0 g/dL   HCT 35.7 (L) 36.0 - 46.0 %   MCV 89.7 78.0 - 100.0 fl   MCHC 33.1 30.0 - 36.0 g/dL   RDW 14.5 11.5 - 15.5 %   Platelets 202.0 150.0 - 400.0 K/uL   Neutrophils Relative % 73.0 43.0 - 77.0 %   Lymphocytes Relative 18.4 12.0 - 46.0 %   Monocytes Relative 7.4 3.0 - 12.0 %   Eosinophils Relative 0.7 0.0 - 5.0 %   Basophils Relative 0.5 0.0 - 3.0 %   Neutro Abs 5.8 1.4 - 7.7 K/uL   Lymphs Abs 1.5 0.7 - 4.0 K/uL   Monocytes Absolute 0.6 0.1 - 1.0 K/uL   Eosinophils Absolute 0.1 0.0 - 0.7 K/uL   Basophils Absolute 0.0 0.0 - 0.1 K/uL  TSH  Result Value Ref Range   TSH 1.79 0.35 - 5.50 uIU/mL  POCT glycosylated hemoglobin (Hb A1C)  Result Value Ref  Range   Hemoglobin A1C 7.0 (A) 4.0 - 5.6 %   HbA1c POC (<> result, manual entry)     HbA1c, POC (prediabetic range)     HbA1c, POC (controlled diabetic range)      Assessment & Plan:   Problem List Items Addressed This Visit     Encounter for chronic pain management (Chronic)    Manages chronic bilateral knee pain from severe osteoarthritis with scheduled tylenol 1059m TID, with hydrocodone for breakthrough pain.  They would like to continue chronic PRN hydrocodone use through our office. Reviewed risks/benefits of medication including sedation, constipation, increased fall risk. Reviewed tolerance/dependence and habit forming nature of medication. Opiate benefits in quality of life deemed to outweigh risks given limited treatment options. Will Rx today.       Primary osteoarthritis of both knees    Predominant difficulty anticipate which is leading to instability and increased fall risk. She regularly sees ortho s/p viscosupplementation injections (latest bilateral monovisc injection 08/2021), has also had L genicular nerve block by PM&R with significant benefit, considering R nerve block.  Manages pain with scheduled tylenol 10039mTID, with hydrocodone for breakthrough pain. They would like to continue PRN hydrocodone through our office. Reviewed risks/benefits of medication including sedation, constipation, increased fall risk. Reviewed tolerance/dependence and habit forming nature of medication. Opiate benefits in quality of life deemed to outweigh risks given limited treatment options. Will Rx today.       Relevant Medications  HYDROcodone-acetaminophen (NORCO/VICODIN) 5-325 MG tablet   Type 2 diabetes mellitus with complication, with long-term current use of insulin (HCC) - Primary    Great control based on recent cbg log - and has been able to decrease metformin dose to 1 tablet daily. Better control attributed to closer monitoring of diet. Continue this and lantus 10u daily.        Relevant Medications   Blood Glucose Monitoring Suppl (ACCU-CHEK GUIDE ME) w/Device KIT   glucose blood (ACCU-CHEK GUIDE) test strip   metFORMIN (GLUCOPHAGE) 500 MG tablet   Other Relevant Orders   POCT glycosylated hemoglobin (Hb A1C) (Completed)   Ferritin (Completed)   IBC panel (Completed)   Comprehensive metabolic panel (Completed)   CBC with Differential/Platelet (Completed)   History of ischemic stroke without residual deficits    Continues xarelto due to afib and h/o stroke.  Low threshold to stop AC if recurrent falls.       Hypothyroidism    Tolerating levothyroxine 25mg well - update TSH today.       Relevant Orders   TSH (Completed)   Atrial fibrillation (HCC)    Continue xarelto and atenolol. Sounds regular today.       Relevant Medications   rivaroxaban (XARELTO) 20 MG TABS tablet   Iron deficiency    Update iron levels      Anemia    Update labs.       Mixed cortical and subcortical vascular dementia, without behavioral disturbance ([place]    Diagnosed with late onset mixed vascular and alz dementia by neurology. She is not on medication for dementia. She receives palliative care outpatient services. She now has a daily assistant (Janett Billow and benefits from this.       Recurrent falls    Severe knee osteoarthritis and weakness contributes to this.       General weakness    Anticipate related to severe knee osteoarthritis.  She has previously completed outpatient physical therapy, continues home exercise program provided.       Physical deconditioning   Hypertension    Stable period on once daily atenolol      Relevant Medications   rivaroxaban (XARELTO) 20 MG TABS tablet     Meds ordered this encounter  Medications   metFORMIN (GLUCOPHAGE) 500 MG tablet    Sig: Take 1 tablet (500 mg total) by mouth daily with breakfast.    Dispense:  90 tablet    Refill:  2   rivaroxaban (XARELTO) 20 MG TABS tablet    Sig: Take 1 tablet (20 mg total) by  mouth daily with supper.    Dispense:  90 tablet    Refill:  2   HYDROcodone-acetaminophen (NORCO/VICODIN) 5-325 MG tablet    Sig: Take 1 tablet by mouth 2 (two) times daily as needed for moderate pain.    Dispense:  30 tablet    Refill:  0   Orders Placed This Encounter  Procedures   Ferritin   IBC panel   Comprehensive metabolic panel   CBC with Differential/Platelet   TSH   POCT glycosylated hemoglobin (Hb A1C)     Patient Instructions  Azucar sigue bien - siga solo 1 metformina diaria.  Laboratorios hoy.  Gusto verlos hoy.  Regresar en 3 meses para proxima visita.   Follow up plan: Return in about 3 months (around 02/11/2022) for follow up visit.  JRia Bush MD

## 2021-11-12 NOTE — Patient Instructions (Addendum)
Azucar sigue bien - siga solo 1 metformina diaria.  Laboratorios hoy.  Gusto verlos hoy.  Regresar en 3 meses para proxima visita.

## 2021-11-14 ENCOUNTER — Other Ambulatory Visit: Payer: Self-pay | Admitting: Family Medicine

## 2021-11-14 DIAGNOSIS — E039 Hypothyroidism, unspecified: Secondary | ICD-10-CM

## 2021-11-14 DIAGNOSIS — I10 Essential (primary) hypertension: Secondary | ICD-10-CM | POA: Insufficient documentation

## 2021-11-14 DIAGNOSIS — G8929 Other chronic pain: Secondary | ICD-10-CM | POA: Insufficient documentation

## 2021-11-14 NOTE — Assessment & Plan Note (Addendum)
Anticipate related to severe knee osteoarthritis.  She has previously completed outpatient physical therapy, continues home exercise program provided.

## 2021-11-14 NOTE — Assessment & Plan Note (Signed)
Manages chronic bilateral knee pain from severe osteoarthritis with scheduled tylenol 1000mg  TID, with hydrocodone for breakthrough pain.  They would like to continue chronic PRN hydrocodone use through our office. Reviewed risks/benefits of medication including sedation, constipation, increased fall risk. Reviewed tolerance/dependence and habit forming nature of medication. Opiate benefits in quality of life deemed to outweigh risks given limited treatment options. Will Rx today.

## 2021-11-14 NOTE — Assessment & Plan Note (Signed)
Continues xarelto due to afib and h/o stroke.  Low threshold to stop AC if recurrent falls.

## 2021-11-14 NOTE — Assessment & Plan Note (Addendum)
Great control based on recent cbg log - and has been able to decrease metformin dose to 1 tablet daily. Better control attributed to closer monitoring of diet. Continue this and lantus 10u daily.

## 2021-11-14 NOTE — Assessment & Plan Note (Addendum)
Update iron levels.  

## 2021-11-14 NOTE — Assessment & Plan Note (Signed)
Update labs.  

## 2021-11-14 NOTE — Assessment & Plan Note (Addendum)
Continue xarelto and atenolol. Sounds regular today.

## 2021-11-14 NOTE — Assessment & Plan Note (Signed)
Stable period on once daily atenolol

## 2021-11-14 NOTE — Assessment & Plan Note (Signed)
Tolerating levothyroxine well - update TSH today.

## 2021-11-14 NOTE — Assessment & Plan Note (Signed)
Predominant difficulty anticipate which is leading to instability and increased fall risk. She regularly sees ortho s/p viscosupplementation injections (latest bilateral monovisc injection 08/2021), has also had L genicular nerve block by PM&R with significant benefit, considering R nerve block.  Manages pain with scheduled tylenol 1000mg  TID, with hydrocodone for breakthrough pain. They would like to continue PRN hydrocodone through our office. Reviewed risks/benefits of medication including sedation, constipation, increased fall risk. Reviewed tolerance/dependence and habit forming nature of medication. Opiate benefits in quality of life deemed to outweigh risks given limited treatment options. Will Rx today.

## 2021-11-14 NOTE — Assessment & Plan Note (Signed)
Severe knee osteoarthritis and weakness contributes to this.

## 2021-11-14 NOTE — Assessment & Plan Note (Signed)
Diagnosed with late onset mixed vascular and alz dementia by neurology. She is not on medication for dementia. She receives palliative care outpatient services. She now has a daily assistant [name]) and benefits from this.

## 2021-11-18 DIAGNOSIS — R2689 Other abnormalities of gait and mobility: Secondary | ICD-10-CM | POA: Diagnosis not present

## 2021-11-18 DIAGNOSIS — D72829 Elevated white blood cell count, unspecified: Secondary | ICD-10-CM | POA: Diagnosis not present

## 2021-11-18 DIAGNOSIS — G301 Alzheimer's disease with late onset: Secondary | ICD-10-CM | POA: Diagnosis not present

## 2021-11-18 DIAGNOSIS — I639 Cerebral infarction, unspecified: Secondary | ICD-10-CM | POA: Diagnosis not present

## 2021-11-18 DIAGNOSIS — F0150 Vascular dementia without behavioral disturbance: Secondary | ICD-10-CM | POA: Diagnosis not present

## 2021-11-18 DIAGNOSIS — R296 Repeated falls: Secondary | ICD-10-CM | POA: Diagnosis not present

## 2021-11-18 DIAGNOSIS — S40021D Contusion of right upper arm, subsequent encounter: Secondary | ICD-10-CM | POA: Diagnosis not present

## 2021-11-19 ENCOUNTER — Telehealth: Payer: Self-pay

## 2021-11-19 NOTE — Telephone Encounter (Signed)
Prior auth started for HYDROcodone-Acetaminophen 5-325MG  tablets. [name] Key: [id number] - PA Case ID: [id number] - Rx #: [id number] Waiting for determination.

## 2021-11-20 NOTE — Telephone Encounter (Signed)
Noted  

## 2021-11-20 NOTE — Telephone Encounter (Signed)
Prior auth for HYDROcodone-Acetaminophen 5-325MG  tablets has been approved. [name] Key: [id number] - PA Case ID: [id number] - Rx #: [id number] Approved on September 19 PA Case: [id number], Status: Approved, Coverage Starts on: 11/19/2021 12:00:00 AM, Coverage Ends on: 05/18/2022  Patient notified via mychart.

## 2021-11-24 DIAGNOSIS — D72829 Elevated white blood cell count, unspecified: Secondary | ICD-10-CM | POA: Diagnosis not present

## 2021-11-24 DIAGNOSIS — F0150 Vascular dementia without behavioral disturbance: Secondary | ICD-10-CM | POA: Diagnosis not present

## 2021-11-24 DIAGNOSIS — G301 Alzheimer's disease with late onset: Secondary | ICD-10-CM | POA: Diagnosis not present

## 2021-11-24 DIAGNOSIS — R2689 Other abnormalities of gait and mobility: Secondary | ICD-10-CM | POA: Diagnosis not present

## 2021-11-24 DIAGNOSIS — R296 Repeated falls: Secondary | ICD-10-CM | POA: Diagnosis not present

## 2021-11-24 DIAGNOSIS — S40021D Contusion of right upper arm, subsequent encounter: Secondary | ICD-10-CM | POA: Diagnosis not present

## 2021-11-24 DIAGNOSIS — I639 Cerebral infarction, unspecified: Secondary | ICD-10-CM | POA: Diagnosis not present

## 2021-12-18 DIAGNOSIS — F0150 Vascular dementia without behavioral disturbance: Secondary | ICD-10-CM | POA: Diagnosis not present

## 2021-12-18 DIAGNOSIS — D72829 Elevated white blood cell count, unspecified: Secondary | ICD-10-CM | POA: Diagnosis not present

## 2021-12-18 DIAGNOSIS — R296 Repeated falls: Secondary | ICD-10-CM | POA: Diagnosis not present

## 2021-12-18 DIAGNOSIS — R2689 Other abnormalities of gait and mobility: Secondary | ICD-10-CM | POA: Diagnosis not present

## 2021-12-18 DIAGNOSIS — I639 Cerebral infarction, unspecified: Secondary | ICD-10-CM | POA: Diagnosis not present

## 2021-12-18 DIAGNOSIS — S40021D Contusion of right upper arm, subsequent encounter: Secondary | ICD-10-CM | POA: Diagnosis not present

## 2021-12-18 DIAGNOSIS — G301 Alzheimer's disease with late onset: Secondary | ICD-10-CM | POA: Diagnosis not present

## 2022-01-06 DIAGNOSIS — M25561 Pain in right knee: Secondary | ICD-10-CM | POA: Diagnosis not present

## 2022-01-06 DIAGNOSIS — M25562 Pain in left knee: Secondary | ICD-10-CM | POA: Diagnosis not present

## 2022-01-06 DIAGNOSIS — M170 Bilateral primary osteoarthritis of knee: Secondary | ICD-10-CM | POA: Diagnosis not present

## 2022-01-06 DIAGNOSIS — G8929 Other chronic pain: Secondary | ICD-10-CM | POA: Diagnosis not present

## 2022-02-05 DIAGNOSIS — M170 Bilateral primary osteoarthritis of knee: Secondary | ICD-10-CM | POA: Diagnosis not present

## 2022-02-11 ENCOUNTER — Telehealth: Payer: Self-pay | Admitting: Family Medicine

## 2022-02-11 ENCOUNTER — Ambulatory Visit: Payer: Medicaid Other | Admitting: Family Medicine

## 2022-02-11 ENCOUNTER — Encounter: Payer: Self-pay | Admitting: Family Medicine

## 2022-02-11 VITALS — BP 118/62 | HR 66 | Temp 97.8°F | Ht 61.0 in | Wt 130.2 lb

## 2022-02-11 DIAGNOSIS — S72001D Fracture of unspecified part of neck of right femur, subsequent encounter for closed fracture with routine healing: Secondary | ICD-10-CM

## 2022-02-11 DIAGNOSIS — R296 Repeated falls: Secondary | ICD-10-CM | POA: Diagnosis not present

## 2022-02-11 DIAGNOSIS — Z794 Long term (current) use of insulin: Secondary | ICD-10-CM | POA: Diagnosis not present

## 2022-02-11 DIAGNOSIS — E611 Iron deficiency: Secondary | ICD-10-CM

## 2022-02-11 DIAGNOSIS — D649 Anemia, unspecified: Secondary | ICD-10-CM | POA: Diagnosis not present

## 2022-02-11 DIAGNOSIS — I10 Essential (primary) hypertension: Secondary | ICD-10-CM | POA: Diagnosis not present

## 2022-02-11 DIAGNOSIS — E118 Type 2 diabetes mellitus with unspecified complications: Secondary | ICD-10-CM | POA: Diagnosis not present

## 2022-02-11 DIAGNOSIS — G8929 Other chronic pain: Secondary | ICD-10-CM | POA: Diagnosis not present

## 2022-02-11 DIAGNOSIS — M170 Bilateral primary osteoarthritis of knee: Secondary | ICD-10-CM | POA: Diagnosis not present

## 2022-02-11 DIAGNOSIS — R5381 Other malaise: Secondary | ICD-10-CM

## 2022-02-11 LAB — POCT GLYCOSYLATED HEMOGLOBIN (HGB A1C): Hemoglobin A1C: 7.4 % — AB (ref 4.0–5.6)

## 2022-02-11 MED ORDER — DEXCOM G7 RECEIVER DEVI: 0 refills | Status: DC

## 2022-02-11 MED ORDER — DEXCOM G7 SENSOR MISC: 3 refills | Status: DC

## 2022-02-11 NOTE — Progress Notes (Addendum)
Patient ID: Milika Ventress, female    DOB: [birth year], 87 y.o.   MRN: [id number]  This visit was conducted in person.  BP 118/62   Pulse 66   Temp 97.8 F (36.6 C) (Temporal)   Ht _0  (1.549 m)   Wt 130 lb 3.2 oz (59.1 kg)   SpO2 92%   BMI 24.60 kg/m    CC: 3 mo DM f/u visit  Subjective:   HPI: Zelena Bushong is a 87 y.o. female presenting on 02/11/2022 for Follow-up (Here for 3 mo f/u. Needs parking placard form completed. Pt accompanied by daughter, [name] and caregiver, Janett Billow. )   Chronic knee pain from known severe osteoarthritis s/p viscosupplementation, outpatient PT. Managed with tylenol 1019m BID, we recently started prescribing hydrocodone 5/3268mPRN, last filled #30 11/2021 and using very sparingly.   Most recently saw [facility] physiatry Dr LiOleta Mouseplanned RFA nerve block s/p successful geniculate nerve block 02/05/2022, planned short term f/u.   Mixed dementia - sees neurology Dr Shah [id number] mild.   R dorsal hand swelling started earlier this week. It seems to be getting better. No redness or warmth of hand.  BP at home occasionally elevated max 147/94, normally BP 120/80s.   DM - does regularly check sugars fasting 90-100s. Compliant with antihyperglycemic regimen which includes: lantus 10u daily, metformin 500m65mce daily. Denies low sugars or hypoglycemic symptoms. Denies paresthesias, blurry vision. Last diabetic eye exam 06/2021. Glucometer brand: accuchek. Last foot exam: 11/2021. DSME: . Lab Results  Component Value Date   HGBA1C 7.4 (A) 02/11/2022   Diabetic Foot Exam - Simple   No data filed    Lab Results  Component Value Date   MICROALBUR 2.0 (H) 04/12/2021         Relevant past medical, surgical, family and social history reviewed and updated as indicated. Interim medical history since our last visit reviewed. Allergies and medications reviewed and updated. Outpatient Medications Prior to Visit  Medication Sig Dispense Refill   atenolol  (TENORMIN) 25 MG tablet TAKE 1 TABLET (25 MG TOTAL) BY MOUTH DAILY. 30 tablet 6   Blood Glucose Monitoring Suppl (ACCU-CHEK GUIDE ME) w/Device KIT Use as instructed to check blood sugar 2 times a day 1 kit 0   glucose blood (ACCU-CHEK GUIDE) test strip Use as instructed to check blood sugar 2 times a day 200 each 0   HYDROcodone-acetaminophen (NORCO/VICODIN) 5-325 MG tablet Take 1 tablet by mouth 2 (two) times daily as needed for moderate pain. 30 tablet 0   Insulin Pen Needle (PEN NEEDLES) 32G X 4 MM MISC Use daily to inject diabetes medication 100 each 3   Lancets 30G MISC Check blood sugar once a day 100 each 3   LANTUS SOLOSTAR 100 UNIT/ML Solostar Pen INJECT 10 UNITS INTO THE SKIN IN THE MORNING. 15 mL 1   levothyroxine (SYNTHROID) 75 MCG tablet TAKE 1 TABLET BY MOUTH EVERY DAY BEFORE BREAKFAST 30 tablet 3   metFORMIN (GLUCOPHAGE) 500 MG tablet Take 1 tablet (500 mg total) by mouth daily with breakfast. 90 tablet 2   polyethylene glycol powder (GLYCOLAX/MIRALAX) 17 GM/SCOOP powder Take 17 g by mouth daily as needed for moderate constipation. 1700 g 1   rivaroxaban (XARELTO) 20 MG TABS tablet Take 1 tablet (20 mg total) by mouth daily with supper. 90 tablet 2   traZODone (DESYREL) 50 MG tablet Take 0.5-1 tablets (25-50 mg total) by mouth at bedtime. (Patient taking differently: Take 25-50 mg by mouth at  bedtime as needed for sleep.) 90 tablet 3   No facility-administered medications prior to visit.     Per HPI unless specifically indicated in ROS section below Review of Systems  Objective:  BP 118/62   Pulse 66   Temp 97.8 F (36.6 C) (Temporal)   Ht _0  (1.549 m)   Wt 130 lb 3.2 oz (59.1 kg)   SpO2 92%   BMI 24.60 kg/m   Wt Readings from Last 3 Encounters:  02/11/22 130 lb 3.2 oz (59.1 kg)  11/12/21 128 lb 6 oz (58.2 kg)  04/25/21 131 lb (59.4 kg)      Physical Exam Vitals and nursing note reviewed.  Constitutional:      Appearance: Normal appearance. She is not  ill-appearing.     Comments: Sitting in wheelchair due to ambulation limitations from knees  Eyes:     Extraocular Movements: Extraocular movements intact.     Pupils: Pupils are equal, round, and reactive to light.  Cardiovascular:     Rate and Rhythm: Normal rate and regular rhythm.     Pulses: Normal pulses.     Heart sounds: Normal heart sounds.  Pulmonary:     Effort: Pulmonary effort is normal. No respiratory distress.     Breath sounds: Normal breath sounds. No wheezing, rhonchi or rales.  Musculoskeletal:        General: Swelling present.     Right lower leg: No edema.     Left lower leg: No edema.     Comments: Mild swelling to R dorsal hand without erythema, warmth. FROM of digits. 1+ rad pulses bilaterally  Skin:    General: Skin is warm and dry.     Findings: No rash.  Neurological:     Mental Status: She is alert.  Psychiatric:        Mood and Affect: Mood normal.        Behavior: Behavior normal.       Results for orders placed or performed in visit on 02/11/22  POCT glycosylated hemoglobin (Hb A1C)  Result Value Ref Range   Hemoglobin A1C 7.4 (A) 4.0 - 5.6 %   HbA1c POC (<> result, manual entry)     HbA1c, POC (prediabetic range)     HbA1c, POC (controlled diabetic range)     Lab Results  Component Value Date   WBC 7.9 11/12/2021   HGB 11.8 (L) 11/12/2021   HCT 35.7 (L) 11/12/2021   MCV 89.7 11/12/2021   PLT 202.0 11/12/2021    Lab Results  Component Value Date   CREATININE 0.88 11/12/2021   BUN 19 11/12/2021   NA 135 11/12/2021   K 4.8 11/12/2021   CL 98 11/12/2021   CO2 27 11/12/2021    Assessment & Plan:  ADDENDUM ==> Due to severe bilateral knee osteoarthritis leading to chronic pain, physical deconditioning and history of recurrent falls, history of hip fracture, as well as dementia history, patient requires frequent changes in body position.  The patient has a medical condition which requires positioning of the body in ways not feasible  with an ordinary bed and/or the patient requires positioning of the body in ways not feasible with an ordinary bed in order to alleviate pain.   Problem List Items Addressed This Visit       Unprioritized   Encounter for chronic pain management (Chronic)     CSRS reviewed. Ok to continue hydrocodone prn - sparing use.       Primary osteoarthritis of  both knees    Severe, activity limiting.  Has had multiple treatments, now seeing [facility] physiatrist for planned RFA of right knee.  Today in wheelchair - handicap placard filled out.       Type 2 diabetes mellitus with complication, with long-term current use of insulin (HCC) - Primary    Chronic, mild deterioration since dropping metformin dose to once daily. She continues daily lantus. Discussed if hypoglycemia develops consider dropping lantus dose.  Reviewed low sugar low carb diabetic diet.  They are interested in continuous glucose monitoring - unsure if she will qualify with Medicaid although she is on daily insulin injections. Will provide Dexcom G7 sample and send in Rx to local pharmacy. Discussed returning for appt with our pharmacist for CGM teaching. They agree.       Relevant Orders   POCT glycosylated hemoglobin (Hb A1C) (Completed)   Iron deficiency    Iron levels normal, ferritin low normal, mild anemia. Consider adding oral iron.       Anemia    Mild, ferritin levels low normal - consider oral iron if anemia worsens as she's on xarelto.       Recurrent falls   Physical deconditioning   Hypertension    Overall stable period on atenolol 73m once daily - on BB in h/o afib.  They note some home fluctuations in blood pressures as well as pulse (?bradycardia) - reviewed how to manually check pulse, they will send me some home readings of BP and pulse to determine med change need. She has previously not tried ACEI/ARB.         Meds ordered this encounter  Medications   Continuous Blood Gluc Sensor (DEXCOM G7  SENSOR) MISC    Sig: Use as directed to check sugars daily    Dispense:  6 each    Refill:  3   Continuous Blood Gluc Receiver (DEXCOM G7 RECEIVER) DEVI    Sig: Use as directed to check sugars daily    Dispense:  1 each    Refill:  0   Orders Placed This Encounter  Procedures   POCT glycosylated hemoglobin (Hb A1C)     Patient Instructions  Mandeme medidas de pulso y presion para ver si hay que modificar medicamentos.  Recomiendo vitamina D3 1000-2000 unidades (25-5108m) diarios.  Tratemos sensor continuo para glucosa - le he dado muGeneral MotorsRevise con seguro cual prefieren.  Regresar en 3-4 meses para proxima visita.   Follow up plan: Return in about 3 months (around 05/13/2022) for follow up visit.  JaRia BushMD

## 2022-02-11 NOTE — Patient Instructions (Addendum)
Mandeme medidas de pulso y presion para ver si hay que modificar medicamentos.  Recomiendo vitamina D3 1000-2000 unidades (25-1mcg) diarios.  Tratemos sensor continuo para glucosa - le he dado [employer]. Revise con seguro cual prefieren.  Regresar en 3-4 meses para proxima visita.

## 2022-02-11 NOTE — Telephone Encounter (Signed)
Patients daughter would like a phone call to setup  a time to come in and be shown how to apply the dexcom on her mom. [phone]

## 2022-02-12 NOTE — Telephone Encounter (Signed)
Contacted the daughter and she will stop by [facility] to get a sample of dexcom to try .  Al Corpus, CPP notified  Burt Knack, [facility] concierge  [phone]

## 2022-02-12 NOTE — Telephone Encounter (Addendum)
Noted! Thank you

## 2022-02-13 ENCOUNTER — Telehealth: Payer: Self-pay

## 2022-02-13 ENCOUNTER — Encounter: Payer: Self-pay | Admitting: Family Medicine

## 2022-02-13 ENCOUNTER — Other Ambulatory Visit: Payer: Self-pay | Admitting: Family Medicine

## 2022-02-13 ENCOUNTER — Other Ambulatory Visit (HOSPITAL_COMMUNITY): Payer: Self-pay

## 2022-02-13 DIAGNOSIS — E039 Hypothyroidism, unspecified: Secondary | ICD-10-CM

## 2022-02-13 NOTE — Assessment & Plan Note (Signed)
Chronic, mild deterioration since dropping metformin dose to once daily. She continues daily lantus. Discussed if hypoglycemia develops consider dropping lantus dose.  Reviewed low sugar low carb diabetic diet.  They are interested in continuous glucose monitoring - unsure if she will qualify with Medicaid although she is on daily insulin injections. Will provide Dexcom G7 sample and send in Rx to local pharmacy. Discussed returning for appt with our pharmacist for CGM teaching. They agree.

## 2022-02-13 NOTE — Assessment & Plan Note (Addendum)
Mild, ferritin levels low normal - consider oral iron if anemia worsens as she's on xarelto.

## 2022-02-13 NOTE — Telephone Encounter (Signed)
Patient coming in 12/14 @ 9:30 to see me for Dexcom sample. No appt scheduled.

## 2022-02-13 NOTE — Assessment & Plan Note (Signed)
Overall stable period on atenolol 25mg  once daily - on BB in h/o afib.  They note some home fluctuations in blood pressures as well as pulse (?bradycardia) - reviewed how to manually check pulse, they will send me some home readings of BP and pulse to determine med change need. She has previously not tried ACEI/ARB.

## 2022-02-13 NOTE — Assessment & Plan Note (Signed)
Iron levels normal, ferritin low normal, mild anemia. Consider adding oral iron.

## 2022-02-13 NOTE — Assessment & Plan Note (Signed)
 CSRS reviewed. Ok to continue hydrocodone prn - sparing use.

## 2022-02-13 NOTE — Telephone Encounter (Signed)
Pharmacy Patient Advocate Encounter   Received notification from [facility] that prior authorization for Dexcom G7 receiver device and sensor is required/requested.    PA submitted on 02/13/22 to [facility] [employer] via CoverMyMeds  Key  [id number] - Dexcom G7 Receiver device  Key  [id number] - Dexcom G7 Sensor  Status is pending

## 2022-02-13 NOTE — Assessment & Plan Note (Signed)
Severe, activity limiting.  Has had multiple treatments, now seeing [facility] physiatrist for planned RFA of right knee.  Today in wheelchair - handicap placard filled out.

## 2022-02-14 NOTE — Telephone Encounter (Addendum)
Sent daughter Earleen Reaper message about medicaid denial. I've written exception request letter - can we send this to medicaid?  Letter in chart.  Thanks!

## 2022-02-14 NOTE — Telephone Encounter (Signed)
Received a fax regarding Prior Authorization from [employer] for Dexcom G7 Receiver device and Sensor.   Authorization has been DENIED because the insurance did not see certain details about the use and treatment. The insurance may consider approval of the device when used in certain situation (when you require 2 or more insulin injection daily, or when using an external insulin pump).  Denial letters attached to chart.

## 2022-02-17 ENCOUNTER — Telehealth: Payer: Self-pay | Admitting: Family Medicine

## 2022-02-17 NOTE — Telephone Encounter (Signed)
Submitted appeal request for the Dexcom CGM supplies to the appeals team.   Please be advised appeals may take up to 5 business days to be submitted as pharmacist prepares necessary documentation. Thank you!

## 2022-02-17 NOTE — Telephone Encounter (Signed)
Vonna Kotyk from [employer] called and stated they sent over a visit note for patient being able to get a hospital bed. Call back number [phone].

## 2022-02-18 NOTE — Telephone Encounter (Signed)
Printed order and placed in Dr. Timoteo Expose box.

## 2022-02-19 NOTE — Telephone Encounter (Signed)
Filled and in Lisa's box 

## 2022-02-20 NOTE — Telephone Encounter (Signed)
Tried faxing order back to [phone] (on cover sheet) but keeps failing. So I faxed to [facility] in [place] at [phone].

## 2022-02-21 ENCOUNTER — Other Ambulatory Visit (HOSPITAL_COMMUNITY): Payer: Self-pay

## 2022-03-10 ENCOUNTER — Other Ambulatory Visit (HOSPITAL_COMMUNITY): Payer: Self-pay

## 2022-03-12 DIAGNOSIS — F0150 Vascular dementia without behavioral disturbance: Secondary | ICD-10-CM | POA: Diagnosis not present

## 2022-03-12 DIAGNOSIS — I639 Cerebral infarction, unspecified: Secondary | ICD-10-CM | POA: Diagnosis not present

## 2022-03-12 DIAGNOSIS — R2689 Other abnormalities of gait and mobility: Secondary | ICD-10-CM | POA: Diagnosis not present

## 2022-03-12 DIAGNOSIS — D72829 Elevated white blood cell count, unspecified: Secondary | ICD-10-CM | POA: Diagnosis not present

## 2022-03-12 DIAGNOSIS — S40021D Contusion of right upper arm, subsequent encounter: Secondary | ICD-10-CM | POA: Diagnosis not present

## 2022-03-12 DIAGNOSIS — R296 Repeated falls: Secondary | ICD-10-CM | POA: Diagnosis not present

## 2022-03-12 DIAGNOSIS — G301 Alzheimer's disease with late onset: Secondary | ICD-10-CM | POA: Diagnosis not present

## 2022-03-13 DIAGNOSIS — M170 Bilateral primary osteoarthritis of knee: Secondary | ICD-10-CM | POA: Diagnosis not present

## 2022-03-31 NOTE — Telephone Encounter (Signed)
Received faxed [place] appeal approval.

## 2022-04-12 DIAGNOSIS — G301 Alzheimer's disease with late onset: Secondary | ICD-10-CM | POA: Diagnosis not present

## 2022-04-12 DIAGNOSIS — F0150 Vascular dementia without behavioral disturbance: Secondary | ICD-10-CM | POA: Diagnosis not present

## 2022-04-12 DIAGNOSIS — S40021D Contusion of right upper arm, subsequent encounter: Secondary | ICD-10-CM | POA: Diagnosis not present

## 2022-04-12 DIAGNOSIS — R296 Repeated falls: Secondary | ICD-10-CM | POA: Diagnosis not present

## 2022-04-12 DIAGNOSIS — R2689 Other abnormalities of gait and mobility: Secondary | ICD-10-CM | POA: Diagnosis not present

## 2022-04-12 DIAGNOSIS — I639 Cerebral infarction, unspecified: Secondary | ICD-10-CM | POA: Diagnosis not present

## 2022-04-12 DIAGNOSIS — D72829 Elevated white blood cell count, unspecified: Secondary | ICD-10-CM | POA: Diagnosis not present

## 2022-04-15 ENCOUNTER — Other Ambulatory Visit (HOSPITAL_COMMUNITY): Payer: Self-pay

## 2022-05-01 ENCOUNTER — Other Ambulatory Visit: Payer: Self-pay | Admitting: Family Medicine

## 2022-05-01 DIAGNOSIS — E039 Hypothyroidism, unspecified: Secondary | ICD-10-CM

## 2022-05-11 DIAGNOSIS — D72829 Elevated white blood cell count, unspecified: Secondary | ICD-10-CM | POA: Diagnosis not present

## 2022-05-11 DIAGNOSIS — R296 Repeated falls: Secondary | ICD-10-CM | POA: Diagnosis not present

## 2022-05-11 DIAGNOSIS — F0150 Vascular dementia without behavioral disturbance: Secondary | ICD-10-CM | POA: Diagnosis not present

## 2022-05-11 DIAGNOSIS — I639 Cerebral infarction, unspecified: Secondary | ICD-10-CM | POA: Diagnosis not present

## 2022-05-11 DIAGNOSIS — R2689 Other abnormalities of gait and mobility: Secondary | ICD-10-CM | POA: Diagnosis not present

## 2022-05-11 DIAGNOSIS — G301 Alzheimer's disease with late onset: Secondary | ICD-10-CM | POA: Diagnosis not present

## 2022-05-11 DIAGNOSIS — S40021D Contusion of right upper arm, subsequent encounter: Secondary | ICD-10-CM | POA: Diagnosis not present

## 2022-06-11 ENCOUNTER — Ambulatory Visit: Payer: Medicaid Other | Admitting: Family Medicine

## 2022-06-11 ENCOUNTER — Encounter: Payer: Self-pay | Admitting: Family Medicine

## 2022-06-11 VITALS — BP 124/78 | HR 61 | Temp 97.7°F | Ht 61.0 in | Wt 136.0 lb

## 2022-06-11 DIAGNOSIS — E118 Type 2 diabetes mellitus with unspecified complications: Secondary | ICD-10-CM | POA: Diagnosis not present

## 2022-06-11 DIAGNOSIS — Z794 Long term (current) use of insulin: Secondary | ICD-10-CM

## 2022-06-11 DIAGNOSIS — M170 Bilateral primary osteoarthritis of knee: Secondary | ICD-10-CM | POA: Diagnosis not present

## 2022-06-11 DIAGNOSIS — I10 Essential (primary) hypertension: Secondary | ICD-10-CM

## 2022-06-11 DIAGNOSIS — I480 Paroxysmal atrial fibrillation: Secondary | ICD-10-CM | POA: Diagnosis not present

## 2022-06-11 DIAGNOSIS — F0150 Vascular dementia without behavioral disturbance: Secondary | ICD-10-CM

## 2022-06-11 DIAGNOSIS — F5101 Primary insomnia: Secondary | ICD-10-CM

## 2022-06-11 DIAGNOSIS — S40021D Contusion of right upper arm, subsequent encounter: Secondary | ICD-10-CM | POA: Diagnosis not present

## 2022-06-11 DIAGNOSIS — D72829 Elevated white blood cell count, unspecified: Secondary | ICD-10-CM | POA: Diagnosis not present

## 2022-06-11 DIAGNOSIS — E039 Hypothyroidism, unspecified: Secondary | ICD-10-CM | POA: Diagnosis not present

## 2022-06-11 DIAGNOSIS — Z8673 Personal history of transient ischemic attack (TIA), and cerebral infarction without residual deficits: Secondary | ICD-10-CM

## 2022-06-11 DIAGNOSIS — G301 Alzheimer's disease with late onset: Secondary | ICD-10-CM | POA: Diagnosis not present

## 2022-06-11 DIAGNOSIS — R531 Weakness: Secondary | ICD-10-CM

## 2022-06-11 DIAGNOSIS — R5381 Other malaise: Secondary | ICD-10-CM

## 2022-06-11 DIAGNOSIS — I639 Cerebral infarction, unspecified: Secondary | ICD-10-CM | POA: Diagnosis not present

## 2022-06-11 DIAGNOSIS — R296 Repeated falls: Secondary | ICD-10-CM | POA: Diagnosis not present

## 2022-06-11 DIAGNOSIS — R2689 Other abnormalities of gait and mobility: Secondary | ICD-10-CM | POA: Diagnosis not present

## 2022-06-11 LAB — POCT GLYCOSYLATED HEMOGLOBIN (HGB A1C): Hemoglobin A1C: 7.2 % — AB (ref 4.0–5.6)

## 2022-06-11 MED ORDER — METFORMIN HCL 500 MG PO TABS: 500.0000 mg | ORAL_TABLET | Freq: Every day | ORAL | 1 refills | Status: DC

## 2022-06-11 MED ORDER — RIVAROXABAN 20 MG PO TABS: 20.0000 mg | ORAL_TABLET | Freq: Every day | ORAL | 1 refills | Status: DC

## 2022-06-11 MED ORDER — LANTUS SOLOSTAR 100 UNIT/ML ~~LOC~~ SOPN: 10.0000 [IU] | PEN_INJECTOR | Freq: Every morning | SUBCUTANEOUS | 1 refills | Status: DC

## 2022-06-11 MED ORDER — LEVOTHYROXINE SODIUM 75 MCG PO TABS: 75.0000 ug | ORAL_TABLET | Freq: Every day | ORAL | 1 refills | Status: DC

## 2022-06-11 NOTE — Assessment & Plan Note (Signed)
Continues xarelto and atenolol followed regularly by cardiology.

## 2022-06-11 NOTE — Assessment & Plan Note (Signed)
Chronic, stable. Continue atenolol 

## 2022-06-11 NOTE — Patient Instructions (Addendum)
Gusto verla hoy.  Cline Cools annual para doctor de ojos  Considere medicamento para depresion.  Regresar en 3-4 meses para fisico   La regla 15-15 para azcares bajas: Si el nivel de azcar est por debajo de 70, tome 15 gramos de carbohidratos para elevar su nivel de azcar en la sangre y revise azucar de nuevo despus de 15 minutos. Si todava est por debajo de 70 mg/dL, tome otra porcin de 15 gramos.  15 gramos de carbohidratos pueden ser: -Pastillas de glucosa (ver instrucciones) -Tubo de gel (ver instrucciones) -4 onzas (1/2 taza) de jugo o refresco regular (no de dieta) -1 cucharada de azcar, miel o jarabe de maz -Caramelos duros, gominolas o gominolas: consulte la etiqueta de los alimentos para saber cuntos consumir  Repita estos pasos hasta que su nivel de azcar en la sangre sea al menos 70 mg/dL. Una vez que su nivel de azcar en la sangre vuelva a la normalidad, coma una comida o un tentempi para asegurarse de que no vuelva a Publishing copy.

## 2022-06-11 NOTE — Assessment & Plan Note (Addendum)
Regularly seeing neurology. Non-english speaker contributes to perceived cognitive issues. Has daily aide [name] who helps her.

## 2022-06-11 NOTE — Assessment & Plan Note (Signed)
Overall stable period off medication. Pt endorses difficulty sleeping however family feel she is sleeping well.

## 2022-06-11 NOTE — Assessment & Plan Note (Signed)
Chronic, stable on current regimen.  They note some hypoglycemia - provided with 15-15 hypoglycemia rule in spanish.  Daughter [name] will try to send me data via mychart to review.

## 2022-06-11 NOTE — Progress Notes (Addendum)
Ph: [phone]       Fax: [phone]   Patient ID: [name], female    DOB: [birth year], 88 y.o.   MRN: [id number]  This visit was conducted in person.  BP 124/78   [name] 61   Temp 97.7 F (36.5 C) (Temporal)   Ht 5\' 1"  (1.549 m)   Wt 136 lb (61.7 kg)   SpO2 100%   BMI 25.70 kg/m    CC: 4 mo DM f/u visit  Subjective:   HPI: [name] is a 88 y.o. female presenting on 06/11/2022 for Medical Management of Chronic Issues (Here for 4 mo DM f/u. Pt accompanied by daughter, Virgina Organ and caregiver, [name]. )   Last CPE/AMW 04/2021 - due for this.   Last month had episode of epigastric abd discomfort late in evening. GERD type pain. She cut down on caffeine with benefit. No dysphagia, early satiety. No recurrent symptoms.   Persistent afib followed by [facility] Cardiology - last saw Dr Beatrix Fetters 08/2021 on atenolol 25mg  daily and xarelto 20mg  daily. Sees [facility] physiatry.   Late onset mod-advanced mixed dementia (alz + vascular) - last saw [facility] neurology Dr Sherryll Burger 01/2022 - she's not taking trazodone for sleep.   Chronic knee pain from severe OA s/p viscosupplementation and PT - continues tylenol and sparing hydrocodone 5/325mg  PRN last filled #30 11/2021. Most recently received R knee genicular nerve block 03/2022 by Dr Verdie Mosher @ [facility] physiatry  DM - does regularly check sugars with CGM, didn't bring data. Compliant with antihyperglycemic regimen which includes: lantus 10u daily, metformin 500mg  daily. Denies low sugars or hypoglycemic symptoms. Denies paresthesias, blurry vision. Last diabetic eye exam 06/2021 - upcoming. Glucometer brand: accuchek, Dexcom G7. Last foot exam: 11/2021. DSME: declines. Weight increased 6 lbs since last seen.  Lab Results  Component Value Date   HGBA1C 7.2 (A) 06/11/2022   Diabetic Foot Exam - Simple   No data filed    Lab Results  Component Value Date   MICROALBUR 2.0 (H) 04/12/2021        Relevant past medical, surgical, family and social  history reviewed and updated as indicated. Interim medical history since our last visit reviewed. Allergies and medications reviewed and updated. Outpatient Medications Prior to Visit  Medication Sig Dispense Refill   Blood Glucose Monitoring Suppl (ACCU-CHEK GUIDE ME) w/Device KIT Use as instructed to check blood sugar 2 times a day 1 kit 0   Continuous Blood Gluc Receiver (DEXCOM G7 RECEIVER) DEVI Use as directed to check sugars daily 1 each 0   Continuous Blood Gluc Sensor (DEXCOM G7 SENSOR) MISC Use as directed to check sugars daily 6 each 3   HYDROcodone-acetaminophen (NORCO/VICODIN) 5-325 MG tablet Take 1 tablet by mouth 2 (two) times daily as needed for moderate pain. 30 tablet 0   Insulin Pen Needle (PEN NEEDLES) 32G X 4 MM MISC Use daily to inject diabetes medication 100 each 3   Lancets 30G MISC Check blood sugar once a day 100 each 3   polyethylene glycol powder (GLYCOLAX/MIRALAX) 17 GM/SCOOP powder Take 17 g by mouth daily as needed for moderate constipation. 1700 g 1   atenolol (TENORMIN) 25 MG tablet TAKE 1 TABLET (25 MG TOTAL) BY MOUTH DAILY. 30 tablet 6   glucose blood (ACCU-CHEK GUIDE) test strip Use as instructed to check blood sugar 2 times a day 200 each 0   LANTUS SOLOSTAR 100 UNIT/ML Solostar Pen INJECT 10 UNITS INTO THE SKIN IN THE MORNING. 15 mL  1   levothyroxine (SYNTHROID) 75 MCG tablet TAKE 1 TABLET BY MOUTH EVERY DAY BEFORE BREAKFAST 90 tablet 0   metFORMIN (GLUCOPHAGE) 500 MG tablet Take 1 tablet (500 mg total) by mouth daily with breakfast. 90 tablet 2   rivaroxaban (XARELTO) 20 MG TABS tablet Take 1 tablet (20 mg total) by mouth daily with supper. 90 tablet 2   traZODone (DESYREL) 50 MG tablet Take 0.5-1 tablets (25-50 mg total) by mouth at bedtime. (Patient taking differently: Take 25-50 mg by mouth at bedtime as needed for sleep.) 90 tablet 3   No facility-administered medications prior to visit.     Per HPI unless specifically indicated in ROS section  below Review of Systems  Objective:  BP 124/78   [name] 61   Temp 97.7 F (36.5 C) (Temporal)   Ht 5\' 1"  (1.549 m)   Wt 136 lb (61.7 kg)   SpO2 100%   BMI 25.70 kg/m   Wt Readings from Last 3 Encounters:  06/11/22 136 lb (61.7 kg)  02/11/22 130 lb 3.2 oz (59.1 kg)  11/12/21 128 lb 6 oz (58.2 kg)      Physical Exam Vitals and nursing note reviewed.  Constitutional:      Appearance: Normal appearance. She is not ill-appearing.     Comments: Sitting in wheelchair  HENT:     Mouth/Throat:     Mouth: Mucous membranes are moist.     Pharynx: Oropharynx is clear. No oropharyngeal exudate or posterior oropharyngeal erythema.  Eyes:     Extraocular Movements: Extraocular movements intact.     Pupils: Pupils are equal, round, and reactive to light.     Comments: L cataract present  Cardiovascular:     Rate and Rhythm: Normal rate and regular rhythm.     Pulses: Normal pulses.     Heart sounds: Normal heart sounds. No murmur heard. Pulmonary:     Effort: Pulmonary effort is normal. No respiratory distress.     Breath sounds: Normal breath sounds. No wheezing, rhonchi or rales.  Musculoskeletal:     Right lower leg: No edema.     Left lower leg: No edema.  Skin:    General: Skin is warm and dry.     Findings: No rash.  Neurological:     Mental Status: She is alert.  Psychiatric:        Mood and Affect: Mood normal.        Behavior: Behavior normal.       Results for orders placed or performed in visit on 06/11/22  POCT glycosylated hemoglobin (Hb A1C)  Result Value Ref Range   Hemoglobin A1C 7.2 (A) 4.0 - 5.6 %   HbA1c POC (<> result, manual entry)     HbA1c, POC (prediabetic range)     HbA1c, POC (controlled diabetic range)      Assessment & Plan:  ADDENDUM===> Due to osteoarthritis, history of stroke, history of recurrent falls, physical deconditioning with general weakness and dementia, patient requires frequent changes in body position.  The patient has a  medical condition which requires positioning of the body in ways not feasible with an ordinary bed. Due to osteoarthritis, history of stroke, and physical deconditioning/general weakness, she is unable to prop herself up to get out of bed and is unable to work manual height-adjustable bed. She needs a mattress to assist with frequent repositioning to prevent bed sores.   Problem List Items Addressed This Visit     Primary osteoarthritis of both knees  Severe, activity limiting. Continue tylenol 1000mg  TID with PRN hydrocodone. She will f/u with physiatry as needed.       Type 2 diabetes mellitus with complication, with long-term current use of insulin (HCC) - Primary    Chronic, stable on current regimen.  They note some hypoglycemia - provided with 15-15 hypoglycemia rule in spanish.  Daughter [name] will try to send me data via mychart to review.       Relevant Medications   insulin glargine (LANTUS SOLOSTAR) 100 UNIT/ML Solostar Pen   metFORMIN (GLUCOPHAGE) 500 MG tablet   Other Relevant Orders   POCT glycosylated hemoglobin (Hb A1C) (Completed)   History of ischemic stroke without residual deficits   Hypothyroidism   Relevant Medications   levothyroxine (SYNTHROID) 75 MCG tablet   Atrial fibrillation (HCC)    Continues xarelto and atenolol followed regularly by cardiology.       Relevant Medications   rivaroxaban (XARELTO) 20 MG TABS tablet   Insomnia    Overall stable period off medication. Pt endorses difficulty sleeping however family feel she is sleeping well.       Mixed cortical and subcortical vascular dementia, without behavioral disturbance (HCC)    Regularly seeing neurology. Non-english speaker contributes to perceived cognitive issues. Has daily aide [name] who helps her.       Recurrent falls   General weakness   Physical deconditioning   Hypertension    Chronic, stable. Continue atenolol.       Relevant Medications   rivaroxaban (XARELTO) 20 MG TABS  tablet     Meds ordered this encounter  Medications   insulin glargine (LANTUS SOLOSTAR) 100 UNIT/ML Solostar Pen    Sig: Inject 10 Units into the skin in the morning.    Dispense:  15 mL    Refill:  1   levothyroxine (SYNTHROID) 75 MCG tablet    Sig: Take 1 tablet (75 mcg total) by mouth daily before breakfast.    Dispense:  90 tablet    Refill:  1   metFORMIN (GLUCOPHAGE) 500 MG tablet    Sig: Take 1 tablet (500 mg total) by mouth daily with breakfast.    Dispense:  90 tablet    Refill:  1   rivaroxaban (XARELTO) 20 MG TABS tablet    Sig: Take 1 tablet (20 mg total) by mouth daily with supper.    Dispense:  90 tablet    Refill:  1    Orders Placed This Encounter  Procedures   POCT glycosylated hemoglobin (Hb A1C)    Patient Instructions  Gusto verla hoy.  Cline Cools annual para doctor de ojos  Considere medicamento para depresion.  Regresar en 3-4 meses para fisico   La regla 15-15 para azcares bajas: Si el nivel de azcar est por debajo de 70, tome 15 gramos de carbohidratos para elevar su nivel de azcar en la sangre y revise azucar de nuevo despus de 15 minutos. Si todava est por debajo de 70 mg/dL, tome otra porcin de 15 gramos.  15 gramos de carbohidratos pueden ser: -Pastillas de glucosa (ver instrucciones) -Tubo de gel (ver instrucciones) -4 onzas (1/2 taza) de jugo o refresco regular (no de dieta) -1 cucharada de azcar, miel o jarabe de maz -Caramelos duros, gominolas o gominolas: consulte la etiqueta de los alimentos para saber cuntos consumir  Repita estos pasos hasta que su nivel de azcar en la sangre sea al menos 70 mg/dL. Una vez que su nivel de azcar en la sangre vuelva  a la normalidad, coma una comida o un tentempi para asegurarse de que no vuelva a bajar.   Follow up plan: Return in about 4 months (around 10/11/2022) for annual exam, prior fasting for blood work.  Eustaquio Boyden, MD

## 2022-06-11 NOTE — Assessment & Plan Note (Signed)
Severe, activity limiting. Continue tylenol 1000mg  TID with PRN hydrocodone. She will f/u with physiatry as needed.

## 2022-07-02 DIAGNOSIS — F0280 Dementia in other diseases classified elsewhere without behavioral disturbance: Secondary | ICD-10-CM | POA: Diagnosis not present

## 2022-07-02 DIAGNOSIS — F0150 Vascular dementia without behavioral disturbance: Secondary | ICD-10-CM | POA: Diagnosis not present

## 2022-07-02 DIAGNOSIS — G309 Alzheimer's disease, unspecified: Secondary | ICD-10-CM | POA: Diagnosis not present

## 2022-07-07 ENCOUNTER — Encounter: Payer: Self-pay | Admitting: Neurology

## 2022-07-07 ENCOUNTER — Other Ambulatory Visit: Payer: Self-pay | Admitting: Neurology

## 2022-07-07 ENCOUNTER — Other Ambulatory Visit: Payer: Self-pay | Admitting: Family Medicine

## 2022-07-07 DIAGNOSIS — F5101 Primary insomnia: Secondary | ICD-10-CM

## 2022-07-07 DIAGNOSIS — Z794 Long term (current) use of insulin: Secondary | ICD-10-CM

## 2022-07-07 DIAGNOSIS — I10 Essential (primary) hypertension: Secondary | ICD-10-CM

## 2022-07-07 DIAGNOSIS — F0150 Vascular dementia without behavioral disturbance: Secondary | ICD-10-CM

## 2022-07-08 NOTE — Telephone Encounter (Signed)
E-scribed refills  Plz schedule CPE and fasting lab visits (around 10/11/2022, per Dr. Reece Agar in last OV notes- 06/11/22) to prevent delayed future refills.

## 2022-07-08 NOTE — Telephone Encounter (Signed)
Noted  

## 2022-07-08 NOTE — Telephone Encounter (Signed)
Patient scheduled.

## 2022-07-10 ENCOUNTER — Encounter: Payer: Self-pay | Admitting: Family Medicine

## 2022-07-11 ENCOUNTER — Encounter: Payer: Self-pay | Admitting: Neurology

## 2022-07-11 DIAGNOSIS — S40021D Contusion of right upper arm, subsequent encounter: Secondary | ICD-10-CM | POA: Diagnosis not present

## 2022-07-11 DIAGNOSIS — R296 Repeated falls: Secondary | ICD-10-CM | POA: Diagnosis not present

## 2022-07-11 DIAGNOSIS — G301 Alzheimer's disease with late onset: Secondary | ICD-10-CM | POA: Diagnosis not present

## 2022-07-11 DIAGNOSIS — D72829 Elevated white blood cell count, unspecified: Secondary | ICD-10-CM | POA: Diagnosis not present

## 2022-07-11 DIAGNOSIS — R2689 Other abnormalities of gait and mobility: Secondary | ICD-10-CM | POA: Diagnosis not present

## 2022-07-11 DIAGNOSIS — I639 Cerebral infarction, unspecified: Secondary | ICD-10-CM | POA: Diagnosis not present

## 2022-07-11 DIAGNOSIS — F0150 Vascular dementia without behavioral disturbance: Secondary | ICD-10-CM | POA: Diagnosis not present

## 2022-07-11 MED ORDER — CEPHALEXIN 500 MG PO CAPS: 500.0000 mg | ORAL_CAPSULE | Freq: Two times a day (BID) | ORAL | 0 refills | Status: AC

## 2022-07-11 NOTE — Telephone Encounter (Signed)
Called daughter and left message will try again later.   UA obtained by neurology 07/02/2022 concerning for infection. UCx not done.

## 2022-07-15 NOTE — Telephone Encounter (Signed)
Please schedule OV for evaluation

## 2022-07-16 NOTE — Telephone Encounter (Signed)
Plz schedule OV for UTI symptoms

## 2022-07-16 NOTE — Telephone Encounter (Signed)
Noted  

## 2022-07-16 NOTE — Telephone Encounter (Signed)
LVM for patient to call back and schedule

## 2022-07-17 ENCOUNTER — Encounter: Payer: Self-pay | Admitting: Family Medicine

## 2022-07-21 ENCOUNTER — Ambulatory Visit
Admission: RE | Admit: 2022-07-21 | Discharge: 2022-07-21 | Disposition: A | Discharge status: Home / Self Care | Payer: Medicaid Other | Source: Ambulatory Visit | Attending: Neurology | Admitting: Neurology

## 2022-07-21 DIAGNOSIS — J3489 Other specified disorders of nose and nasal sinuses: Secondary | ICD-10-CM | POA: Diagnosis not present

## 2022-07-21 DIAGNOSIS — I69351 Hemiplegia and hemiparesis following cerebral infarction affecting right dominant side: Secondary | ICD-10-CM | POA: Diagnosis not present

## 2022-07-21 DIAGNOSIS — F0150 Vascular dementia without behavioral disturbance: Secondary | ICD-10-CM

## 2022-07-21 DIAGNOSIS — I6782 Cerebral ischemia: Secondary | ICD-10-CM | POA: Diagnosis not present

## 2022-07-21 DIAGNOSIS — G319 Degenerative disease of nervous system, unspecified: Secondary | ICD-10-CM | POA: Diagnosis not present

## 2022-07-21 NOTE — Telephone Encounter (Signed)
Spoke with Gaylyn Rong of [employer] [facility] asking for fax # to send info for appeal. She provided fax # [phone].   Info was faxed.   Fax keeps failing. Will try to get another number from pt's daughter, [name] (on dpr), to send info.   Per [name], she was told she can email the info. Says she will come by office to pick up today.   [Placed original letter, OV notes and written rx at front office. Made copy of letter and written rx to be scanned.]

## 2022-07-21 NOTE — Telephone Encounter (Signed)
See pt request. Hospital bed Rx written, chart notes updated to reflect need for hospital bed, order and latest OV placed in Lisa's box.

## 2022-07-21 NOTE — Telephone Encounter (Signed)
Error

## 2022-07-31 ENCOUNTER — Other Ambulatory Visit: Payer: Self-pay | Admitting: Family Medicine

## 2022-07-31 DIAGNOSIS — F5101 Primary insomnia: Secondary | ICD-10-CM

## 2022-07-31 NOTE — Telephone Encounter (Signed)
Message from pharmacy: REQUEST FOR 90 DAYS PRESCRIPTION. DX Code Needed. However, dx code already attached.  Last rx:  07/08/22, #30 Last OV:  06/11/22, 4 mo DM f/u Next OV:  10/15/22,  CPE

## 2022-08-11 DIAGNOSIS — D72829 Elevated white blood cell count, unspecified: Secondary | ICD-10-CM | POA: Diagnosis not present

## 2022-08-11 DIAGNOSIS — R2689 Other abnormalities of gait and mobility: Secondary | ICD-10-CM | POA: Diagnosis not present

## 2022-08-11 DIAGNOSIS — I639 Cerebral infarction, unspecified: Secondary | ICD-10-CM | POA: Diagnosis not present

## 2022-08-11 DIAGNOSIS — R296 Repeated falls: Secondary | ICD-10-CM | POA: Diagnosis not present

## 2022-08-11 DIAGNOSIS — F0150 Vascular dementia without behavioral disturbance: Secondary | ICD-10-CM | POA: Diagnosis not present

## 2022-08-11 DIAGNOSIS — S40021D Contusion of right upper arm, subsequent encounter: Secondary | ICD-10-CM | POA: Diagnosis not present

## 2022-08-11 DIAGNOSIS — G301 Alzheimer's disease with late onset: Secondary | ICD-10-CM | POA: Diagnosis not present

## 2022-08-20 ENCOUNTER — Other Ambulatory Visit: Payer: Self-pay | Admitting: Family Medicine

## 2022-08-20 DIAGNOSIS — I10 Essential (primary) hypertension: Secondary | ICD-10-CM

## 2022-08-25 ENCOUNTER — Other Ambulatory Visit: Payer: Self-pay | Admitting: Family Medicine

## 2022-08-25 DIAGNOSIS — Z794 Long term (current) use of insulin: Secondary | ICD-10-CM

## 2022-08-26 ENCOUNTER — Encounter: Payer: Self-pay | Admitting: Family Medicine

## 2022-09-02 NOTE — Telephone Encounter (Signed)
Filled form and in Lisa's box.  Sent clarifying question regarding recommended LOC to daughter [name].

## 2022-09-02 NOTE — Telephone Encounter (Signed)
Placed form at front office. Made copy to scan.  

## 2022-10-01 ENCOUNTER — Encounter (INDEPENDENT_AMBULATORY_CARE_PROVIDER_SITE_OTHER): Payer: Self-pay

## 2022-10-04 ENCOUNTER — Other Ambulatory Visit: Payer: Self-pay | Admitting: Family Medicine

## 2022-10-04 DIAGNOSIS — M8580 Other specified disorders of bone density and structure, unspecified site: Secondary | ICD-10-CM

## 2022-10-04 DIAGNOSIS — E611 Iron deficiency: Secondary | ICD-10-CM

## 2022-10-04 DIAGNOSIS — D649 Anemia, unspecified: Secondary | ICD-10-CM

## 2022-10-04 DIAGNOSIS — I480 Paroxysmal atrial fibrillation: Secondary | ICD-10-CM

## 2022-10-04 DIAGNOSIS — E039 Hypothyroidism, unspecified: Secondary | ICD-10-CM

## 2022-10-04 DIAGNOSIS — Z794 Long term (current) use of insulin: Secondary | ICD-10-CM

## 2022-10-08 ENCOUNTER — Other Ambulatory Visit (INDEPENDENT_AMBULATORY_CARE_PROVIDER_SITE_OTHER): Payer: Medicaid Other

## 2022-10-08 DIAGNOSIS — D649 Anemia, unspecified: Secondary | ICD-10-CM | POA: Diagnosis not present

## 2022-10-08 DIAGNOSIS — E118 Type 2 diabetes mellitus with unspecified complications: Secondary | ICD-10-CM | POA: Diagnosis not present

## 2022-10-08 DIAGNOSIS — M8580 Other specified disorders of bone density and structure, unspecified site: Secondary | ICD-10-CM

## 2022-10-08 DIAGNOSIS — E039 Hypothyroidism, unspecified: Secondary | ICD-10-CM | POA: Diagnosis not present

## 2022-10-08 DIAGNOSIS — Z794 Long term (current) use of insulin: Secondary | ICD-10-CM

## 2022-10-08 DIAGNOSIS — I480 Paroxysmal atrial fibrillation: Secondary | ICD-10-CM

## 2022-10-08 DIAGNOSIS — E611 Iron deficiency: Secondary | ICD-10-CM

## 2022-10-08 LAB — IBC PANEL
Iron: 84 ug/dL (ref 42–145)
Saturation Ratios: 23.2 % (ref 20.0–50.0)
TIBC: 362.6 ug/dL (ref 250.0–450.0)
Transferrin: 259 mg/dL (ref 212.0–360.0)

## 2022-10-08 LAB — LIPID PANEL
Cholesterol: 143 mg/dL (ref 0–200)
HDL: 44.7 mg/dL (ref >39.00)
LDL Cholesterol: 77 mg/dL (ref 0–99)
NonHDL: 98.54
Total CHOL/HDL Ratio: 3
Triglycerides: 106 mg/dL (ref 0.0–149.0)
VLDL: 21.2 mg/dL (ref 0.0–40.0)

## 2022-10-08 LAB — COMPREHENSIVE METABOLIC PANEL
ALT: 11 U/L (ref 0–35)
AST: 17 U/L (ref 0–37)
Albumin: 4.2 g/dL (ref 3.5–5.2)
Alkaline Phosphatase: 52 U/L (ref 39–117)
BUN: 18 mg/dL (ref 6–23)
CO2: 30 mEq/L (ref 19–32)
Calcium: 9.4 mg/dL (ref 8.4–10.5)
Chloride: 97 mEq/L (ref 96–112)
Creatinine, Ser: 0.86 mg/dL (ref 0.40–1.20)
GFR: 61.38 mL/min (ref >60.00)
Glucose, Bld: 106 mg/dL — ABNORMAL HIGH (ref 70–99)
Potassium: 4.5 mEq/L (ref 3.5–5.1)
Sodium: 134 mEq/L — ABNORMAL LOW (ref 135–145)
Total Bilirubin: 0.5 mg/dL (ref 0.2–1.2)
Total Protein: 7 g/dL (ref 6.0–8.3)

## 2022-10-08 LAB — CBC WITH DIFFERENTIAL/PLATELET
Basophils Absolute: 0.1 10*3/uL (ref 0.0–0.1)
Basophils Relative: 0.8 % (ref 0.0–3.0)
Eosinophils Absolute: 0.2 10*3/uL (ref 0.0–0.7)
Eosinophils Relative: 2.7 % (ref 0.0–5.0)
HCT: 37.8 % (ref 36.0–46.0)
Hemoglobin: 12.1 g/dL (ref 12.0–15.0)
Lymphocytes Relative: 15.7 % (ref 12.0–46.0)
Lymphs Abs: 1.3 10*3/uL (ref 0.7–4.0)
MCHC: 32.1 g/dL (ref 30.0–36.0)
MCV: 92.3 fl (ref 78.0–100.0)
Monocytes Absolute: 0.6 10*3/uL (ref 0.1–1.0)
Monocytes Relative: 7.2 % (ref 3.0–12.0)
Neutro Abs: 6 10*3/uL (ref 1.4–7.7)
Neutrophils Relative %: 73.6 % (ref 43.0–77.0)
Platelets: 244 10*3/uL (ref 150.0–400.0)
RBC: 4.1 Mil/uL (ref 3.87–5.11)
RDW: 14 % (ref 11.5–15.5)
WBC: 8.2 10*3/uL (ref 4.0–10.5)

## 2022-10-08 LAB — MICROALBUMIN / CREATININE URINE RATIO
Creatinine,U: 26.9 mg/dL
Microalb Creat Ratio: 2.6 mg/g (ref 0.0–30.0)
Microalb, Ur: <0.7 mg/dL (ref 0.0–1.9)

## 2022-10-08 LAB — HEMOGLOBIN A1C: Hgb A1c MFr Bld: 7.4 % — ABNORMAL HIGH (ref 4.6–6.5)

## 2022-10-08 LAB — FERRITIN: Ferritin: 26.6 ng/mL (ref 10.0–291.0)

## 2022-10-08 LAB — VITAMIN D 25 HYDROXY (VIT D DEFICIENCY, FRACTURES): VITD: 52.32 ng/mL (ref 30.00–100.00)

## 2022-10-08 LAB — TSH: TSH: 2.07 u[IU]/mL (ref 0.35–5.50)

## 2022-10-15 ENCOUNTER — Ambulatory Visit (INDEPENDENT_AMBULATORY_CARE_PROVIDER_SITE_OTHER)
Admission: RE | Admit: 2022-10-15 | Discharge: 2022-10-15 | Disposition: A | Discharge status: Home / Self Care | Payer: Medicaid Other | Source: Ambulatory Visit | Attending: Family Medicine | Admitting: Family Medicine

## 2022-10-15 ENCOUNTER — Ambulatory Visit: Payer: Medicaid Other | Admitting: Family Medicine

## 2022-10-15 ENCOUNTER — Encounter: Payer: Self-pay | Admitting: Family Medicine

## 2022-10-15 VITALS — BP 138/86 | HR 78 | Temp 97.6°F | Ht 60.75 in | Wt 149.2 lb

## 2022-10-15 DIAGNOSIS — R0689 Other abnormalities of breathing: Secondary | ICD-10-CM

## 2022-10-15 DIAGNOSIS — E611 Iron deficiency: Secondary | ICD-10-CM

## 2022-10-15 DIAGNOSIS — Z8673 Personal history of transient ischemic attack (TIA), and cerebral infarction without residual deficits: Secondary | ICD-10-CM | POA: Diagnosis not present

## 2022-10-15 DIAGNOSIS — R059 Cough, unspecified: Secondary | ICD-10-CM | POA: Insufficient documentation

## 2022-10-15 DIAGNOSIS — Z8781 Personal history of (healed) traumatic fracture: Secondary | ICD-10-CM

## 2022-10-15 DIAGNOSIS — E118 Type 2 diabetes mellitus with unspecified complications: Secondary | ICD-10-CM | POA: Diagnosis not present

## 2022-10-15 DIAGNOSIS — Z0001 Encounter for general adult medical examination with abnormal findings: Secondary | ICD-10-CM | POA: Diagnosis not present

## 2022-10-15 DIAGNOSIS — M170 Bilateral primary osteoarthritis of knee: Secondary | ICD-10-CM

## 2022-10-15 DIAGNOSIS — Z794 Long term (current) use of insulin: Secondary | ICD-10-CM

## 2022-10-15 DIAGNOSIS — I10 Essential (primary) hypertension: Secondary | ICD-10-CM

## 2022-10-15 DIAGNOSIS — N3000 Acute cystitis without hematuria: Secondary | ICD-10-CM

## 2022-10-15 DIAGNOSIS — F5101 Primary insomnia: Secondary | ICD-10-CM

## 2022-10-15 DIAGNOSIS — Z7189 Other specified counseling: Secondary | ICD-10-CM

## 2022-10-15 DIAGNOSIS — I480 Paroxysmal atrial fibrillation: Secondary | ICD-10-CM

## 2022-10-15 DIAGNOSIS — R052 Subacute cough: Secondary | ICD-10-CM

## 2022-10-15 DIAGNOSIS — M8580 Other specified disorders of bone density and structure, unspecified site: Secondary | ICD-10-CM

## 2022-10-15 DIAGNOSIS — D649 Anemia, unspecified: Secondary | ICD-10-CM

## 2022-10-15 DIAGNOSIS — E039 Hypothyroidism, unspecified: Secondary | ICD-10-CM | POA: Diagnosis not present

## 2022-10-15 DIAGNOSIS — F0150 Vascular dementia without behavioral disturbance: Secondary | ICD-10-CM

## 2022-10-15 LAB — POC URINALSYSI DIPSTICK (AUTOMATED)
Bilirubin, UA: NEGATIVE
Blood, UA: NEGATIVE
Glucose, UA: NEGATIVE
Ketones, UA: NEGATIVE
Leukocytes, UA: NEGATIVE
Nitrite, UA: NEGATIVE
Protein, UA: NEGATIVE
Spec Grav, UA: 1.015 (ref 1.010–1.025)
Urobilinogen, UA: 0.2 E.U./dL
pH, UA: 5.5 (ref 5.0–8.0)

## 2022-10-15 MED ORDER — ATENOLOL 25 MG PO TABS
25.0000 mg | ORAL_TABLET | Freq: Every day | ORAL | 4 refills | Status: DC
Start: 2022-10-15 — End: 2023-07-25

## 2022-10-15 MED ORDER — VITAMIN D3 25 MCG (1000 UT) PO CAPS: 1.0000 | ORAL_CAPSULE | Freq: Every day | ORAL | Status: DC

## 2022-10-15 MED ORDER — LEVOTHYROXINE SODIUM 75 MCG PO TABS
75.0000 ug | ORAL_TABLET | Freq: Every day | ORAL | 4 refills | Status: DC
Start: 2022-10-15 — End: 2023-07-25

## 2022-10-15 MED ORDER — RIVAROXABAN 20 MG PO TABS: 20.0000 mg | ORAL_TABLET | Freq: Every day | ORAL | 4 refills | Status: DC

## 2022-10-15 MED ORDER — TRAZODONE HCL 50 MG PO TABS
25.0000 mg | ORAL_TABLET | Freq: Every evening | ORAL | 1 refills | Status: DC | PRN
Start: 2022-10-15 — End: 2023-05-06

## 2022-10-15 MED ORDER — METFORMIN HCL 500 MG PO TABS: 500.0000 mg | ORAL_TABLET | Freq: Every day | ORAL | 4 refills | Status: DC

## 2022-10-15 MED ORDER — B COMPLEX VITAMINS PO CAPS: 1.0000 | ORAL_CAPSULE | Freq: Every day | ORAL | Status: DC

## 2022-10-15 MED ORDER — LANTUS SOLOSTAR 100 UNIT/ML ~~LOC~~ SOPN: 10.0000 [IU] | PEN_INJECTOR | Freq: Every morning | SUBCUTANEOUS | 2 refills | Status: DC

## 2022-10-15 NOTE — Assessment & Plan Note (Signed)
Overall stable period managed with PRN trazodone or quetiapine (Seroquel) through neurology.

## 2022-10-15 NOTE — Assessment & Plan Note (Signed)
Previously discussed. Has not completed. Encouraged continued working on this, bring [place] copy when completed.

## 2022-10-15 NOTE — Assessment & Plan Note (Signed)
This has resolved with better iron intake.

## 2022-10-15 NOTE — Assessment & Plan Note (Signed)
H/o this on testing by neurology earlier this year, she completed 1 wk keflex course.  Will update UA today ensure no ongoing signs of infection - she never had significant UTI symptoms but may have had worsened confusion.

## 2022-10-15 NOTE — Assessment & Plan Note (Signed)
Cough with choking when drinking - update CXR. Recommend trial pepcid 20mg  OTC to treat possible reflux component. She does endorse intermittent reflux symptoms.

## 2022-10-15 NOTE — Assessment & Plan Note (Signed)
Reviewed with patient. Encouraged good calcium in diet and vit D supplement.  Declines rpt DEXA

## 2022-10-15 NOTE — Assessment & Plan Note (Signed)
Appreciate neurology care - continues trazodone and seroquel PRN.

## 2022-10-15 NOTE — Assessment & Plan Note (Signed)
Iron levels normal on MVI with iron and increased iron in diet.

## 2022-10-15 NOTE — Progress Notes (Signed)
Ph: [phone] Fax: [phone]   Patient ID: [name], female    DOB: 1934/04/12, 88 y.o.   MRN: [id number]  This visit was conducted in person.  BP 138/86   [name] 78   Temp 97.6 F (36.4 C) (Temporal)   Ht 5' 0.75" (1.543 m)   Wt 149 lb 4 oz (67.7 kg)   SpO2 97%   BMI 28.43 kg/m    CC: CPE Subjective:   HPI: [name] is a 88 y.o. female presenting on 10/15/2022 for Annual Exam (Pt accompanied by daughter, [name] and caregiver, [name]. )   Did not see health advisor - has Medicaid  No results found.        No data to display          Chronic knee pain from severe OA s/p viscosupplementation 06/2020 followed by PT - continues tylenol and sparing hydrocodone 5/325mg  PRN last filled #30 11/2021. Most recently received R knee genicular nerve block 03/2022 x2 by Dr Verdie Mosher @ [facility].   Persistent afib followed by [facility] clinic Cardiology - last saw Dr Beatrix Fetters 08/2021 on atenolol 25mg  daily and xarelto 20mg  daily.    Late onset mod-advanced mixed dementia (alz + vascular) - last saw [facility] neurology Dr Sherryll Burger 07/2022 - with increasing hallucinations delusion and hand tremors suspected component of Lewy Body disease. Seroquel 25mg  nightly started. MRI 07/2022 showing multiple old small strokes and moderate ischemic changes, mod-advanced cerebral and cerebellar atrophy.   H/o UTI when last seen by neurology - treated with keflex abx 500mg  bid 7d course.  Using diaper regularly. Planning to start pure wick.    She has lift rail stair chair at home as well as portable wheelchair, raised commode seat, shower chair and hand rail.    DM - continues 10 units lantus daily and metformin 500mg  daily.   Preventative: Colon cancer screening - s/p colonoscopy 2013 WNL per patient in [name]. Aged out.  Breast cancer screening - mammo remotely WNL. Declines rpt. Does check breast exams at home without concerns. No fmhx breast cancer.  Well woman exam - s/p hysterectomy with  BSO. Underwent premarin treatment after menopause.  DEXA scan - 2013 normal per patient  Lung cancer screening - not eligible  Flu shot - declines due to bad reaction in the past  COVID vaccine J&J 06/2019, booster 10/2019  Prevnar-13 01/2016. Pneumovax 08/2017  Shingles shot - has declined Advanced directive discussion - does not have living will. Discussed. Packet previously provided (Spanish). Does not want dialysis. Ok with temporary life support, does not want prolonged life support.  Seat belt use discussed Sunscreen use discussed. No changing moles on skin.  Non-smoker  Alcohol - rare  Dentist - has dentures Eye exam - due - declines Bowels - constipation - managed with fiber in diet and oatmeal  Bladder - no incontinence    Lives with daughter [name], stays extended periods with other children across the [place]  Originally from [name], moved 2013 G5P5 Activity: stationary bicycle 45 min/day - has stopped this Diet: good water, fruits/vegetables daily     Relevant past medical, surgical, family and social history reviewed and updated as indicated. Interim medical history since our last visit reviewed. Allergies and medications reviewed and updated. Outpatient Medications Prior to Visit  Medication Sig Dispense Refill   Blood Glucose Monitoring Suppl (ACCU-CHEK GUIDE ME) w/Device KIT Use as instructed to check blood sugar 2 times a day 1 kit 0   Continuous  Blood Gluc Receiver (DEXCOM G7 RECEIVER) DEVI Use as directed to check sugars daily 1 each 0   Continuous Blood Gluc Sensor (DEXCOM G7 SENSOR) MISC Use as directed to check sugars daily 6 each 3   glucose blood (ACCU-CHEK GUIDE) test strip USE AS INSTRUCTED TO CHECK BLOOD SUGAR ONCE A DAY 100 each 3   HYDROcodone-acetaminophen (NORCO/VICODIN) 5-325 MG tablet Take 1 tablet by mouth 2 (two) times daily as needed for moderate pain. 30 tablet 0   Insulin Pen Needle (PEN NEEDLES) 32G X 4 MM MISC Use daily to inject  diabetes medication 100 each 3   Lancets 30G MISC Check blood sugar once a day 100 each 3   polyethylene glycol powder (GLYCOLAX/MIRALAX) 17 GM/SCOOP powder Take 17 g by mouth daily as needed for moderate constipation. 1700 g 1   QUEtiapine (SEROQUEL) 25 MG tablet Take by mouth. Takes 1/4 tablet As needed     atenolol (TENORMIN) 25 MG tablet TAKE 1 TABLET (25 MG TOTAL) BY MOUTH DAILY. 90 tablet 0   insulin glargine (LANTUS SOLOSTAR) 100 UNIT/ML Solostar Pen Inject 10 Units into the skin in the morning. 15 mL 1   levothyroxine (SYNTHROID) 75 MCG tablet Take 1 tablet (75 mcg total) by mouth daily before breakfast. 90 tablet 1   metFORMIN (GLUCOPHAGE) 500 MG tablet Take 1 tablet (500 mg total) by mouth daily with breakfast. 90 tablet 1   rivaroxaban (XARELTO) 20 MG TABS tablet Take 1 tablet (20 mg total) by mouth daily with supper. 90 tablet 1   traZODone (DESYREL) 50 MG tablet TAKE 1/2 TO 1 TABLET (25 TO 50 MG TOTAL) BY MOUTH AT BEDTIME 90 tablet 2   No facility-administered medications prior to visit.     Per HPI unless specifically indicated in ROS section below Review of Systems  Objective:  BP 138/86   [name] 78   Temp 97.6 F (36.4 C) (Temporal)   Ht 5' 0.75" (1.543 m)   Wt 149 lb 4 oz (67.7 kg)   SpO2 97%   BMI 28.43 kg/m   Wt Readings from Last 3 Encounters:  10/15/22 149 lb 4 oz (67.7 kg)  06/11/22 136 lb (61.7 kg)  02/11/22 130 lb 3.2 oz (59.1 kg)      Physical Exam Vitals and nursing note reviewed.  Constitutional:      Appearance: Normal appearance. She is not ill-appearing.     Comments: Sitting in wheelchair  HENT:     Head: Normocephalic and atraumatic.     Right Ear: Tympanic membrane, ear canal and external ear normal. There is no impacted cerumen.     Left Ear: Tympanic membrane, ear canal and external ear normal. There is no impacted cerumen.     Mouth/Throat:     Mouth: Mucous membranes are moist.     Pharynx: Oropharynx is clear. No oropharyngeal exudate  or posterior oropharyngeal erythema.  Eyes:     General:        Right eye: No discharge.        Left eye: No discharge.     Extraocular Movements: Extraocular movements intact.     Conjunctiva/sclera: Conjunctivae normal.     Pupils: Pupils are equal, round, and reactive to light.  Neck:     Thyroid: No thyroid mass or thyromegaly.  Cardiovascular:     Rate and Rhythm: Normal rate and regular rhythm.     Pulses: Normal pulses.     Heart sounds: Normal heart sounds. No murmur heard. Pulmonary:  Effort: Pulmonary effort is normal. No respiratory distress.     Breath sounds: Normal breath sounds. No wheezing, rhonchi or rales.  Abdominal:     General: Bowel sounds are normal. There is no distension.     Palpations: Abdomen is soft. There is no mass.     Tenderness: There is no abdominal tenderness. There is no guarding or rebound.     Hernia: No hernia is present.  Musculoskeletal:     Cervical back: Normal range of motion and neck supple. No rigidity.     Right lower leg: No edema.     Left lower leg: No edema.  Lymphadenopathy:     Cervical: No cervical adenopathy.  Skin:    General: Skin is warm and dry.     Findings: No rash.  Neurological:     General: No focal deficit present.     Mental Status: She is alert. Mental status is at baseline.  Psychiatric:        Mood and Affect: Mood normal.        Behavior: Behavior normal.       Results for orders placed or performed in visit on 10/15/22  POCT Urinalysis Dipstick (Automated)  Result Value Ref Range   Color, UA yellow    Clarity, UA Clear    Glucose, UA Negative Negative   Bilirubin, UA neg    Ketones, UA neg    Spec Grav, UA 1.015 1.010 - 1.025   Blood, UA neg    pH, UA 5.5 5.0 - 8.0   Protein, UA Negative Negative   Urobilinogen, UA 0.2 0.2 or 1.0 E.U./dL   Nitrite, UA neg    Leukocytes, UA Negative Negative    Assessment & Plan:   Problem List Items Addressed This Visit     Encounter for general  adult medical examination with abnormal findings - Primary (Chronic)    Preventative protocols reviewed and updated unless pt declined. Discussed healthy diet and lifestyle.       Advanced care planning/counseling discussion (Chronic)    Previously discussed. Has not completed. Encouraged continued working on this, bring [place] copy when completed.      Primary osteoarthritis of both knees    Severe, limits activity.  S/p steroid injections followed by several sets of synvisc injections and finally R knee genicular nerve block through [facility] 03/2022 (limited benefit, very expensive).  Planning to return to ortho to discuss rpt synvisc injections.       Type 2 diabetes mellitus with complication, with long-term current use of insulin (HCC)    Chronic, adequate on current regimen - continue.       Relevant Medications   insulin glargine (LANTUS SOLOSTAR) 100 UNIT/ML Solostar Pen   metFORMIN (GLUCOPHAGE) 500 MG tablet   History of ischemic stroke without residual deficits    Continues xarelto. Not on statin.       Hypothyroidism    Chronic, stable on levothyroxine daily - continue this       Relevant Medications   atenolol (TENORMIN) 25 MG tablet   levothyroxine (SYNTHROID) 75 MCG tablet   Osteopenia    Reviewed with patient. Encouraged good calcium in diet and vit D supplement.  Declines rpt DEXA      Atrial fibrillation (HCC)    Continues xarelto and atenolol, no longer seeing cardiology. Last saw Dr Suzzanne [facility] 2023      Relevant Medications   atenolol (TENORMIN) 25 MG tablet   rivaroxaban (XARELTO) 20  MG TABS tablet   Insomnia    Overall stable period managed with PRN trazodone or quetiapine (Seroquel) through neurology.       Relevant Medications   traZODone (DESYREL) 50 MG tablet   Iron deficiency    Iron levels normal on MVI with iron and increased iron in diet.       Anemia    This has resolved with better iron intake.       Mixed  cortical and subcortical vascular dementia, without behavioral disturbance (HCC)    Appreciate neurology care - continues trazodone and seroquel PRN.       Relevant Medications   QUEtiapine (SEROQUEL) 25 MG tablet   traZODone (DESYREL) 50 MG tablet   Hypertension    Chronic, stable on atenolol 25mg  daily.       Relevant Medications   atenolol (TENORMIN) 25 MG tablet   rivaroxaban (XARELTO) 20 MG TABS tablet   Status post-operative repair of closed fracture of right hip   Abnormal breath sounds    Abnormal LLL sounds with concern for choking with coughing - check CXR to eval for aspiration.       Relevant Orders   DG Chest 2 View   Acute cystitis without hematuria    H/o this on testing by neurology earlier this year, she completed 1 wk keflex course.  Will update UA today ensure no ongoing signs of infection - she never had significant UTI symptoms but may have had worsened confusion.       Relevant Orders   POCT Urinalysis Dipstick (Automated) (Completed)   Cough    Cough with choking when drinking - update CXR. Recommend trial pepcid 20mg  OTC to treat possible reflux component. She does endorse intermittent reflux symptoms.         Meds ordered this encounter  Medications   atenolol (TENORMIN) 25 MG tablet    Sig: Take 1 tablet (25 mg total) by mouth daily.    Dispense:  90 tablet    Refill:  4   insulin glargine (LANTUS SOLOSTAR) 100 UNIT/ML Solostar Pen    Sig: Inject 10 Units into the skin in the morning.    Dispense:  15 mL    Refill:  2   levothyroxine (SYNTHROID) 75 MCG tablet    Sig: Take 1 tablet (75 mcg total) by mouth daily before breakfast.    Dispense:  90 tablet    Refill:  4   metFORMIN (GLUCOPHAGE) 500 MG tablet    Sig: Take 1 tablet (500 mg total) by mouth daily with breakfast.    Dispense:  90 tablet    Refill:  4   rivaroxaban (XARELTO) 20 MG TABS tablet    Sig: Take 1 tablet (20 mg total) by mouth daily with supper.    Dispense:  90 tablet     Refill:  4   traZODone (DESYREL) 50 MG tablet    Sig: Take 0.5-1 tablets (25-50 mg total) by mouth at bedtime as needed for sleep.    Dispense:  90 tablet    Refill:  1   Cholecalciferol (VITAMIN D3) 25 MCG (1000 UT) CAPS    Sig: Take 1 capsule (1,000 Units total) by mouth daily.   b complex vitamins capsule    Sig: Take 1 capsule by mouth daily.    Orders Placed This Encounter  Procedures   DG Chest 2 View    Standing Status:   Future    Number of Occurrences:   1  Standing Expiration Date:   10/15/2023    Order Specific Question:   Reason for Exam (SYMPTOM  OR DIAGNOSIS REQUIRED)    Answer:   LL crackles, r/o aspiration    Order Specific Question:   Preferred imaging location?    Answer:   [facility]   POCT Urinalysis Dipstick (Automated)    Patient Instructions  Urinalysis today  Puede tratar pepcid (famotidine) para sintomas de reflujo.  Gusto verlos hoy.  Regresar en 6 meses para proxima visita.   Follow up plan: Return in about 6 months (around 04/17/2023), or if symptoms worsen or fail to improve, for follow up visit.  Eustaquio Boyden, MD

## 2022-10-15 NOTE — Assessment & Plan Note (Signed)
Severe, limits activity.  S/p steroid injections followed by several sets of synvisc injections and finally R knee genicular nerve block through [facility] 03/2022 (limited benefit, very expensive).  Planning to return to ortho to discuss rpt synvisc injections.

## 2022-10-15 NOTE — Assessment & Plan Note (Signed)
Chronic, stable on levothyroxine 71mg daily - continue this.

## 2022-10-15 NOTE — Patient Instructions (Addendum)
Urinalysis today  Puede tratar pepcid (famotidine) para sintomas de reflujo.  Gusto verlos hoy.  Regresar en 6 meses para proxima visita.

## 2022-10-15 NOTE — Assessment & Plan Note (Signed)
Preventative protocols reviewed and updated unless pt declined. Discussed healthy diet and lifestyle.  

## 2022-10-15 NOTE — Assessment & Plan Note (Signed)
Chronic, stable on atenolol 25mg  daily.

## 2022-10-15 NOTE — Assessment & Plan Note (Signed)
Abnormal LLL sounds with concern for choking with coughing - check CXR to eval for aspiration.

## 2022-10-15 NOTE — Assessment & Plan Note (Signed)
Chronic, adequate on current regimen - continue.

## 2022-10-15 NOTE — Assessment & Plan Note (Signed)
Continues xarelto. Not on statin.

## 2022-10-15 NOTE — Assessment & Plan Note (Signed)
Continues xarelto and atenolol, no longer seeing cardiology. Last saw Dr Suzzanne [facility] 2023

## 2022-10-21 ENCOUNTER — Encounter: Payer: Self-pay | Admitting: Family Medicine

## 2022-11-10 DIAGNOSIS — I10 Essential (primary) hypertension: Secondary | ICD-10-CM | POA: Diagnosis not present

## 2022-11-10 DIAGNOSIS — G8929 Other chronic pain: Secondary | ICD-10-CM | POA: Diagnosis not present

## 2022-11-10 DIAGNOSIS — I4819 Other persistent atrial fibrillation: Secondary | ICD-10-CM | POA: Diagnosis not present

## 2022-11-10 DIAGNOSIS — F0150 Vascular dementia without behavioral disturbance: Secondary | ICD-10-CM | POA: Diagnosis not present

## 2022-11-10 DIAGNOSIS — G309 Alzheimer's disease, unspecified: Secondary | ICD-10-CM | POA: Diagnosis not present

## 2022-11-10 DIAGNOSIS — Z794 Long term (current) use of insulin: Secondary | ICD-10-CM | POA: Diagnosis not present

## 2022-11-10 DIAGNOSIS — E118 Type 2 diabetes mellitus with unspecified complications: Secondary | ICD-10-CM | POA: Diagnosis not present

## 2022-11-10 DIAGNOSIS — E785 Hyperlipidemia, unspecified: Secondary | ICD-10-CM | POA: Diagnosis not present

## 2022-11-10 DIAGNOSIS — M170 Bilateral primary osteoarthritis of knee: Secondary | ICD-10-CM | POA: Diagnosis not present

## 2022-11-10 DIAGNOSIS — F0280 Dementia in other diseases classified elsewhere without behavioral disturbance: Secondary | ICD-10-CM | POA: Diagnosis not present

## 2022-11-11 ENCOUNTER — Telehealth: Payer: Self-pay | Admitting: Family Medicine

## 2022-11-11 NOTE — Telephone Encounter (Signed)
FYI: This call has been transferred to Access Nurse. Once the result note has been entered staff can address the message at that time.  Patient called in with the following symptoms:  Red Word: elevated blood sugar Patient daughter [name] called in and stated that Joscelyne had two cortisone shots on yesterday in her knees. She stated that they informed her that her blood sugar will go up and its 489 right now and this morning it was 331 and she has been extremely tired. She stated that she gave her 10 units of insulin this morning and her metformin. She was wanting to know if she need to give her some extra insulin or what to do.   Please advise at Mobile [phone] (mobile)  Message is routed to Provider Pool and [facility] Triage

## 2022-11-11 NOTE — Telephone Encounter (Signed)
I was not able to get call thru to [name] or Gabriel Rung, I left v/m for Alex to call office and I spoke with Nelva Bush pt daughter  in [place] and gave her Dr Sharen Hones instructions to give pt another 5 units of insulin today and to take FBS in AM and call FBS results to Dr Sharen Hones at [phone]. Alex then called office and she initially could not get [name] on phone but then she texted [name] and  then [name] called [facility]. I spoke with [name] and she said not to call her sisters that [name] was POA and only our office should call [name] or her husband. I explained to [name] it was late in day and was trying to get her Dr Gutierrez's instructions for care of pt. [name] voiced understanding and took down info and [name] said at 2:22 pm she gave pt metformin 500 mg. I told Dr Reece Agar and he said if BS now was elevated more than 250 to give pt the additional 5 units of insulin today. At 4:45 pm BS was 458 by finger stick because dexcom was reading HI.  [name] did say that it took longer to get the BS because pt did not listen and pt got up by herself to go to bathroom and pt fell. [name] said pt will not wear a diaper and will not listen to get someone to help her when she needs to get up.[name] said pt was OK and did not get hurt. [name] said she will give pt 5 units of insulin now and will call in AM with FBS. UC & ED precautions also given and [name] voiced understanding sending note to Dr Reece Agar. Demographics updated with additional contact # for [name].

## 2022-11-12 ENCOUNTER — Encounter: Payer: Self-pay | Admitting: Family Medicine

## 2022-11-12 DIAGNOSIS — F0150 Vascular dementia without behavioral disturbance: Secondary | ICD-10-CM

## 2022-11-12 DIAGNOSIS — R531 Weakness: Secondary | ICD-10-CM

## 2022-11-12 DIAGNOSIS — E118 Type 2 diabetes mellitus with unspecified complications: Secondary | ICD-10-CM

## 2022-11-12 DIAGNOSIS — R5381 Other malaise: Secondary | ICD-10-CM

## 2022-11-12 DIAGNOSIS — Z8781 Personal history of (healed) traumatic fracture: Secondary | ICD-10-CM

## 2022-11-12 DIAGNOSIS — R296 Repeated falls: Secondary | ICD-10-CM

## 2022-11-12 DIAGNOSIS — I480 Paroxysmal atrial fibrillation: Secondary | ICD-10-CM

## 2022-11-12 DIAGNOSIS — Z8673 Personal history of transient ischemic attack (TIA), and cerebral infarction without residual deficits: Secondary | ICD-10-CM

## 2022-11-12 DIAGNOSIS — Z794 Long term (current) use of insulin: Secondary | ICD-10-CM

## 2022-11-12 NOTE — Telephone Encounter (Signed)
Left v/m requesting cb from [name] to get update on pt condition and also find out result of FBS today. Sending note to Dr Reece Agar and G pool.

## 2022-11-12 NOTE — Telephone Encounter (Addendum)
Daughter has not called back. Attempted to reach pt's daughter, unsuccessfully. Noted she sent mychart message earlier this morning - will reply via mychart.  Reviewed chart- she received total 160mg  triamcinolone at once on 11/10/2022.

## 2022-11-18 NOTE — Addendum Note (Signed)
Addended by: Eustaquio Boyden on: 11/18/2022 07:55 AM   Modules accepted: Orders

## 2022-11-18 NOTE — Telephone Encounter (Signed)
Palliative care will be able to arrange this for her right?

## 2022-11-20 NOTE — Telephone Encounter (Signed)
Called patient daughter [name] on dpr. States that this morning fasting readings were 176. She got alerted by sensor that her readings were hight and had the following readings   12:14 460  given 5 units  1:30   501  given 5 units  2:40  509  3:03  504 3:40  461  Patient has had a total of 22 units of Lantus today with the normal dose and 10u given extra.  All vitals are good. Wt 150lbs, o2 94 p74 bp 17/81. Daughter denies any signs of infection. No fever, nausea or vomiting. No urinary symptoms. Have reviewed with Dr. Para March advised patient daughter to give 5 more units of insulin. No carbs and increase WATER. Do not have any sodas. I have reviewed with daughter as well as red words that she would need to be seen in ED. Daughter states will not go to ED. They are still waiting on call back to set up palliative care.  Advised we will send message to Dr. Sharen Hones to review as well once back in office tomorrow.

## 2022-11-21 ENCOUNTER — Telehealth: Payer: Self-pay | Admitting: Family Medicine

## 2022-11-21 NOTE — Telephone Encounter (Signed)
See mychart message about her breathing and please triage patient. Thanks.

## 2022-11-21 NOTE — Telephone Encounter (Signed)
Please see 11/12/22 pt message; was already triaged and Dr Sharen Hones who is in office today is aware. Thank you.Sending to Dr Sharen Hones.

## 2022-11-25 NOTE — Telephone Encounter (Signed)
Late entry - spoke with daughter [name] in office yesterday while she was in for labs.  She states low O2 readings were actually pulse, not O2 on pulse oximetry.  She states sugar levels have returned to normal with decreased steroid effect (after recent bilateral knee steroid injections).  Palliative care came out to evaluate patient, pt not deemed hospice eligible at this time.

## 2022-12-16 ENCOUNTER — Other Ambulatory Visit: Payer: Self-pay | Admitting: Family Medicine

## 2022-12-17 DIAGNOSIS — M170 Bilateral primary osteoarthritis of knee: Secondary | ICD-10-CM | POA: Diagnosis not present

## 2022-12-17 DIAGNOSIS — G8929 Other chronic pain: Secondary | ICD-10-CM | POA: Diagnosis not present

## 2022-12-19 ENCOUNTER — Encounter: Payer: Self-pay | Admitting: Family Medicine

## 2022-12-19 ENCOUNTER — Other Ambulatory Visit: Payer: Self-pay | Admitting: Family Medicine

## 2022-12-19 DIAGNOSIS — R3981 Functional urinary incontinence: Secondary | ICD-10-CM

## 2022-12-19 MED ORDER — DEXCOM G7 SENSOR MISC: 11 refills | Status: DC

## 2023-01-01 NOTE — Telephone Encounter (Signed)
Dr Reece Agar, do you have these orders? I haven't seen them.

## 2023-01-06 DIAGNOSIS — R3981 Functional urinary incontinence: Secondary | ICD-10-CM | POA: Insufficient documentation

## 2023-01-06 NOTE — Telephone Encounter (Signed)
Faxed form to [employer] at [phone].

## 2023-01-06 NOTE — Telephone Encounter (Signed)
Filled and placed in Lisa's box.

## 2023-01-08 DIAGNOSIS — R3981 Functional urinary incontinence: Secondary | ICD-10-CM | POA: Diagnosis not present

## 2023-01-08 DIAGNOSIS — E118 Type 2 diabetes mellitus with unspecified complications: Secondary | ICD-10-CM | POA: Diagnosis not present

## 2023-02-10 DIAGNOSIS — R3981 Functional urinary incontinence: Secondary | ICD-10-CM | POA: Diagnosis not present

## 2023-02-10 DIAGNOSIS — E118 Type 2 diabetes mellitus with unspecified complications: Secondary | ICD-10-CM | POA: Diagnosis not present

## 2023-03-12 DIAGNOSIS — R3981 Functional urinary incontinence: Secondary | ICD-10-CM | POA: Diagnosis not present

## 2023-03-12 DIAGNOSIS — E118 Type 2 diabetes mellitus with unspecified complications: Secondary | ICD-10-CM | POA: Diagnosis not present

## 2023-04-14 DIAGNOSIS — R3981 Functional urinary incontinence: Secondary | ICD-10-CM | POA: Diagnosis not present

## 2023-04-17 ENCOUNTER — Encounter: Payer: Self-pay | Admitting: Family Medicine

## 2023-04-20 ENCOUNTER — Other Ambulatory Visit (HOSPITAL_COMMUNITY): Payer: Self-pay

## 2023-04-20 ENCOUNTER — Telehealth (HOSPITAL_COMMUNITY): Payer: Self-pay | Admitting: Pharmacy Technician

## 2023-04-20 NOTE — Telephone Encounter (Signed)
 Pharmacy Patient Advocate Encounter  Received notification from [facility] that Prior Authorization for [facility] G7 SENSORS has been APPROVED from 04/20/2023 to 10/17/2023. Ran test claim, Copay is $0.00. This test claim was processed through [facility]- copay amounts may vary at other pharmacies due to pharmacy/plan contracts, or as the patient moves through the different stages of their insurance plan.   PA #/Case ID/Reference #: [id number]

## 2023-04-27 NOTE — Telephone Encounter (Signed)
 PA request has been Approved. New Encounter created for follow up. For additional info see Pharmacy Prior Auth telephone encounter from 04/20/2023.

## 2023-05-06 ENCOUNTER — Other Ambulatory Visit: Payer: Self-pay | Admitting: Family Medicine

## 2023-05-06 DIAGNOSIS — F5101 Primary insomnia: Secondary | ICD-10-CM

## 2023-05-14 DIAGNOSIS — R3981 Functional urinary incontinence: Secondary | ICD-10-CM | POA: Diagnosis not present

## 2023-05-14 DIAGNOSIS — E118 Type 2 diabetes mellitus with unspecified complications: Secondary | ICD-10-CM | POA: Diagnosis not present

## 2023-07-13 ENCOUNTER — Encounter: Payer: Self-pay | Admitting: Family Medicine

## 2023-07-24 NOTE — Telephone Encounter (Signed)
 So sorry to hear this. Thanks for letting me know.

## 2023-07-24 NOTE — Telephone Encounter (Signed)
 Spoke with daughter Rice Chamorro.  Pt passed away 2023-07-19 in [place], [place] after massive stroke.  Offered condolences. Fyi to [place].

## 2023-08-02 DEATH — deceased

## 2023-09-17 IMAGING — DX DG CHEST 1V PORT
1 series · 1 of 1 positions shown · non-contrast
Comparison: None.

CLINICAL DATA: Fall

EXAM:
PORTABLE CHEST 1 VIEW

[chest ap]
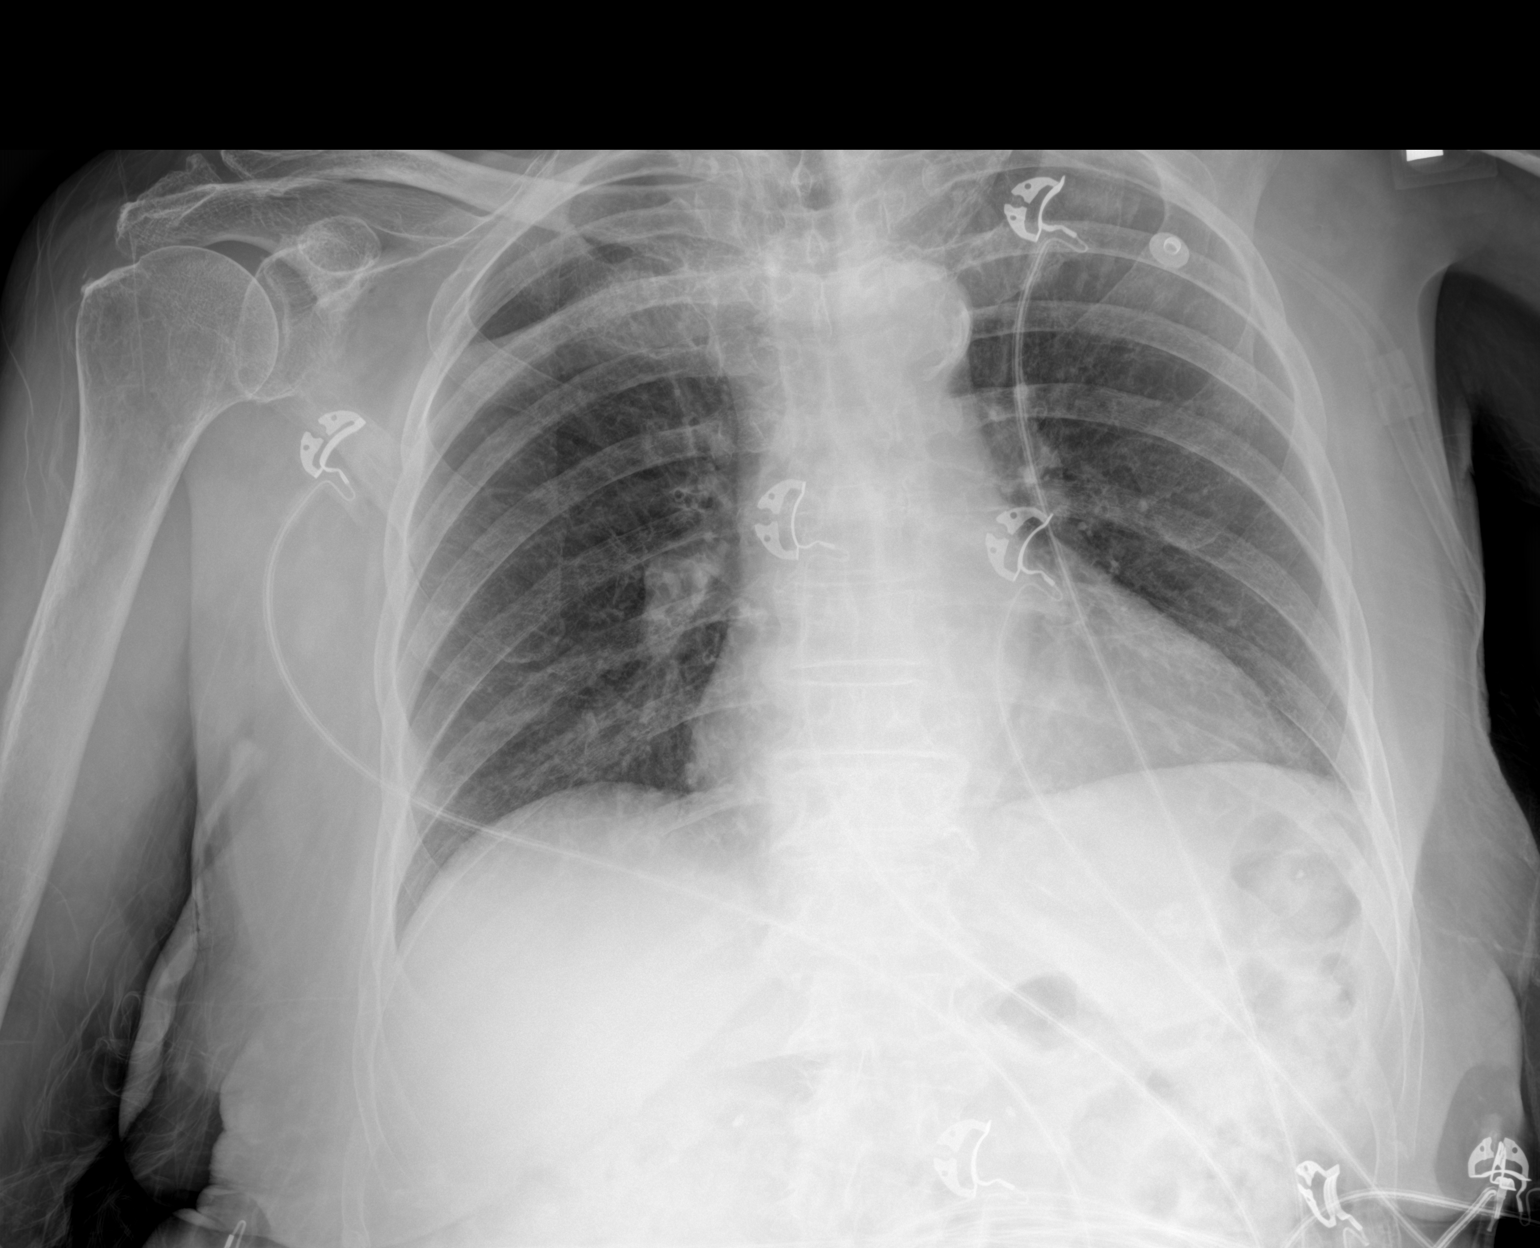

[1 of 1 positions shown; findings below may reference images not displayed]

FINDINGS: The heart size and mediastinal contours are within normal limits.
Both lungs are clear. The visualized skeletal structures are
unremarkable.
IMPRESSION: No active disease.
